# Patient Record
Sex: Female | Born: 1995 | Race: Black or African American | Hispanic: No | Smoking: Never smoker
Health system: Southern US, Community
[De-identification: ages and names within clinical notes are randomized; demographics above are authoritative.]

## PROBLEM LIST (undated history)

## (undated) ENCOUNTER — Inpatient Hospital Stay (HOSPITAL_COMMUNITY): Payer: Self-pay

## (undated) ENCOUNTER — Emergency Department (HOSPITAL_BASED_OUTPATIENT_CLINIC_OR_DEPARTMENT_OTHER): Discharge status: Absent without leave | Payer: Medicaid Other

## (undated) DIAGNOSIS — F419 Anxiety disorder, unspecified: Secondary | ICD-10-CM

## (undated) DIAGNOSIS — Z789 Other specified health status: Secondary | ICD-10-CM

## (undated) DIAGNOSIS — F32A Depression, unspecified: Secondary | ICD-10-CM

## (undated) DIAGNOSIS — F1910 Other psychoactive substance abuse, uncomplicated: Secondary | ICD-10-CM

## (undated) DIAGNOSIS — T7840XA Allergy, unspecified, initial encounter: Secondary | ICD-10-CM

## (undated) HISTORY — DX: Depression, unspecified: F32.A

## (undated) HISTORY — DX: Anxiety disorder, unspecified: F41.9

## (undated) HISTORY — DX: Allergy, unspecified, initial encounter: T78.40XA

## (undated) HISTORY — DX: Other psychoactive substance abuse, uncomplicated: F19.10

## (undated) HISTORY — DX: Other specified health status: Z78.9

## (undated) HISTORY — PX: WISDOM TOOTH EXTRACTION: SHX21

---
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
## 2015-03-23 ENCOUNTER — Encounter (HOSPITAL_COMMUNITY): Payer: Self-pay

## 2015-03-23 ENCOUNTER — Emergency Department (HOSPITAL_COMMUNITY)
Admission: EM | Admit: 2015-03-23 | Discharge: 2015-03-23 | Disposition: A | Discharge status: Home / Self Care | Payer: Medicaid Other | Attending: Emergency Medicine | Admitting: Emergency Medicine

## 2015-03-23 DIAGNOSIS — Z880 Allergy status to penicillin: Secondary | ICD-10-CM | POA: Diagnosis not present

## 2015-03-23 DIAGNOSIS — B373 Candidiasis of vulva and vagina: Secondary | ICD-10-CM | POA: Insufficient documentation

## 2015-03-23 DIAGNOSIS — Z711 Person with feared health complaint in whom no diagnosis is made: Secondary | ICD-10-CM

## 2015-03-23 DIAGNOSIS — Z202 Contact with and (suspected) exposure to infections with a predominantly sexual mode of transmission: Secondary | ICD-10-CM | POA: Diagnosis not present

## 2015-03-23 DIAGNOSIS — F17210 Nicotine dependence, cigarettes, uncomplicated: Secondary | ICD-10-CM | POA: Insufficient documentation

## 2015-03-23 DIAGNOSIS — B3731 Acute candidiasis of vulva and vagina: Secondary | ICD-10-CM

## 2015-03-23 LAB — WET PREP, GENITAL
Clue Cells Wet Prep HPF POC: NONE SEEN
Sperm: NONE SEEN
Trich, Wet Prep: NONE SEEN
Yeast Wet Prep HPF POC: NONE SEEN

## 2015-03-23 MED ORDER — CEFTRIAXONE SODIUM 250 MG IJ SOLR
250.0000 mg | Freq: Once | INTRAMUSCULAR | Status: AC
  Administered 2015-03-23: 250 mg via INTRAMUSCULAR
  Filled 2015-03-23: qty 250

## 2015-03-23 MED ORDER — ONDANSETRON 4 MG PO TBDP
4.0000 mg | ORAL_TABLET | Freq: Once | ORAL | Status: AC
  Administered 2015-03-23: 4 mg via ORAL
  Filled 2015-03-23: qty 1

## 2015-03-23 MED ORDER — LIDOCAINE HCL (PF) 1 % IJ SOLN
5.0000 mL | Freq: Once | INTRAMUSCULAR | Status: AC
  Administered 2015-03-23: 5 mL
  Filled 2015-03-23: qty 5

## 2015-03-23 MED ORDER — FLUCONAZOLE 200 MG PO TABS: 200.0000 mg | ORAL_TABLET | Freq: Every day | ORAL | Status: AC

## 2015-03-23 MED ORDER — AZITHROMYCIN 250 MG PO TABS
1000.0000 mg | ORAL_TABLET | Freq: Once | ORAL | Status: AC
  Administered 2015-03-23: 1000 mg via ORAL
  Filled 2015-03-23: qty 4

## 2015-03-23 NOTE — ED Notes (Signed)
Pt arrives stating she has been exposed to NGU and request check up.

## 2015-03-23 NOTE — ED Provider Notes (Signed)
CSN: [id number]     Arrival date & time 03/23/15  1254 History  By signing my name below, I, Ronney LionSuzanne Le, attest that this documentation has been prepared under the direction and in the presence of Marlon Peliffany Yailene Badia, PA-C. Electronically Signed: Ronney LionSuzanne Le, ED Scribe. 03/23/2015. 2:10 PM.    Chief Complaint  Patient presents with  . SEXUALLY TRANSMITTED DISEASE   The history is provided by the patient. No language interpreter was used.    HPI Comments: Marisa Page is a 19 y.o. female who presents to the Emergency Department complaining of exposure to an STD. She states her boyfriend of 2-3 months recently tested positive for NGU and advised her to be tested as well, although she has not had any symptoms. She states she normally uses condoms, but she had 2 instances of unprotected sexual intercourse, last had on 03/06/15. She denies any vaginal discharge or vaginal pain. No symptoms.  History reviewed. No pertinent past medical history. History reviewed. No pertinent past surgical history. Family History  Problem Relation Age of Onset  . Thyroid disease Mother   . Diabetes Father   . Stroke Father   . Thyroid disease Maternal Aunt   . Cancer Maternal Grandmother    Social History  Substance Use Topics  . Smoking status: Current Every Day Smoker -- 0.50 packs/day    Types: Cigarettes, Cigars  . Smokeless tobacco: None  . Alcohol Use: None   OB History    Gravida Para Term Preterm AB TAB SAB Ectopic Multiple Living   1    1     0     Review of Systems A complete 10 system review of systems was obtained and all systems are negative except as noted in the HPI and PMH.    Allergies  Penicillins  Home Medications   Prior to Admission medications   Not on File   BP 126/70 mmHg  Pulse 60  Temp(Src) 98 F (36.7 C) (Oral)  Ht 5\' 8"  (1.727 m)  Wt 80.74 kg  BMI 27.07 kg/m2  SpO2 100%  LMP 03/06/2015 Physical Exam  Constitutional: She is oriented to person, place,  and time. She appears well-developed and well-nourished. No distress.  HENT:  Head: Normocephalic and atraumatic.  Eyes: Conjunctivae and EOM are normal.  Neck: Neck supple. No tracheal deviation present.  Cardiovascular: Normal rate.   Pulmonary/Chest: Effort normal. No respiratory distress.  Genitourinary: Vagina normal and uterus normal. Right adnexum displays no mass, no tenderness and no fullness. Left adnexum displays no mass, no tenderness and no fullness.  Musculoskeletal: Normal range of motion.  Neurological: She is alert and oriented to person, place, and time.  Skin: Skin is warm and dry.  Psychiatric: She has a normal mood and affect. Her behavior is normal.  Nursing note and vitals reviewed.   ED Course  Procedures (including critical care time)  DIAGNOSTIC STUDIES: Oxygen Saturation is 100% on RA, normal by my interpretation.    COORDINATION OF CARE: 2:04 PM - GC/CHL and Wet Prep sample collected. If results are positive, will notify pt by phone in 2 days. If negative, will not contact pt. Discussed this and treatment plan with pt at bedside which includes treatment for gonorrhea/chlamydia. Advised abstaining from sex for 1 week. Pt verbalized understanding and agreed to plan.    MDM   Final diagnoses:  Concern about STD in female without diagnosis   Medications  cefTRIAXone (ROCEPHIN) injection 250 mg (not administered)  azithromycin (ZITHROMAX) tablet 1,000  mg (not administered)  ondansetron (ZOFRAN-ODT) disintegrating tablet 4 mg (not administered)    19 y.o.Marisa Page's medical screening exam was performed and I feel the patient has had an appropriate workup for their chief complaint at this time and likelihood of emergent condition existing is low. They have been counseled on decision, discharge, follow up and which symptoms necessitate immediate return to the emergency department. They or their family verbally stated understanding and agreement  with plan and discharged in stable condition.   Vital signs are stable at discharge. Filed Vitals:   03/23/15 1300  BP: 126/70  Pulse: 60  Temp: 98 F (36.7 C)      I personally performed the services described in this documentation, which was scribed in my presence. The recorded information has been reviewed and is accurate.     Marlon Pel, PA-C 03/23/15 1410  Melene Plan, DO 03/23/15 [phone]

## 2015-03-23 NOTE — Discharge Instructions (Signed)
Safe Sex  Safe sex is about reducing the risk of giving or getting a sexually transmitted disease (STD). STDs are spread through sexual contact involving the genitals, mouth, or rectum. Some STDs can be cured and others cannot. Safe sex can also prevent unintended pregnancies.   WHAT ARE SOME SAFE SEX PRACTICES?  · Limit your sexual activity to only one partner who is having sex with only you.  · Talk to your partner about his or her past partners, past STDs, and drug use.  · Use a condom every time you have sexual intercourse. This includes vaginal, oral, and anal sexual activity. Both females and males should wear condoms during oral sex. Only use latex or polyurethane condoms and water-based lubricants. Using petroleum-based lubricants or oils to lubricate a condom will weaken the condom and increase the chance that it will break. The condom should be in place from the beginning to the end of sexual activity. Wearing a condom reduces, but does not completely eliminate, your risk of getting or giving an STD. STDs can be spread by contact with infected body fluids and skin.  · Get vaccinated for hepatitis B and HPV.  · Avoid alcohol and recreational drugs, which can affect your judgment. You may forget to use a condom or participate in high-risk sex.  · For females, avoid douching after sexual intercourse. Douching can spread an infection farther into the reproductive tract.  · Check your body for signs of sores, blisters, rashes, or unusual discharge. See your health care provider if you notice any of these signs.  · Avoid sexual contact if you have symptoms of an infection or are being treated for an STD. If you or your partner has herpes, avoid sexual contact when blisters are present. Use condoms at all other times.  · If you are at risk of being infected with HIV, it is recommended that you take a prescription medicine daily to prevent HIV infection. This is called pre-exposure prophylaxis (PrEP). You are  considered at risk if:    You are a man who has sex with other men (MSM).    You are a heterosexual man or woman who is sexually active with more than one partner.    You take drugs by injection.    You are sexually active with a partner who has HIV.  · Talk with your health care provider about whether you are at high risk of being infected with HIV. If you choose to begin PrEP, you should first be tested for HIV. You should then be tested every 3 months for as long as you are taking PrEP.  · See your health care provider for regular screenings, exams, and tests for other STDs. Before having sex with a new partner, each of you should be screened for STDs and should talk about the results with each other.  WHAT ARE THE BENEFITS OF SAFE SEX?   · There is less chance of getting or giving an STD.  · You can prevent unwanted or unintended pregnancies.  · By discussing safe sex concerns with your partner, you may increase feelings of intimacy, comfort, trust, and honesty between the two of you.     This information is not intended to replace advice given to you by your health care provider. Make sure you discuss any questions you have with your health care provider.     Document Released: 05/25/2004 Document Revised: 05/08/2014 Document Reviewed: 10/09/2011  Elsevier Interactive Patient Education ©2016 Elsevier Inc.

## 2015-03-23 NOTE — ED Notes (Signed)
Patient states boyfriend advised that she needed to be tested for "NGU".  Patient unsure of his exact diagnosis.

## 2015-03-24 LAB — GC/CHLAMYDIA PROBE AMP (~~LOC~~) NOT AT ARMC
Chlamydia: NEGATIVE
Neisseria Gonorrhea: NEGATIVE

## 2015-03-30 ENCOUNTER — Telehealth (HOSPITAL_COMMUNITY): Payer: Self-pay

## 2015-04-01 ENCOUNTER — Emergency Department (HOSPITAL_COMMUNITY)
Admission: EM | Admit: 2015-04-01 | Discharge: 2015-04-01 | Disposition: A | Discharge status: Home / Self Care | Payer: Medicaid Other | Attending: Emergency Medicine | Admitting: Emergency Medicine

## 2015-04-01 ENCOUNTER — Encounter (HOSPITAL_COMMUNITY): Payer: Self-pay | Admitting: *Deleted

## 2015-04-01 DIAGNOSIS — Z880 Allergy status to penicillin: Secondary | ICD-10-CM | POA: Diagnosis not present

## 2015-04-01 DIAGNOSIS — F17200 Nicotine dependence, unspecified, uncomplicated: Secondary | ICD-10-CM | POA: Insufficient documentation

## 2015-04-01 DIAGNOSIS — K602 Anal fissure, unspecified: Secondary | ICD-10-CM | POA: Diagnosis not present

## 2015-04-01 DIAGNOSIS — K625 Hemorrhage of anus and rectum: Secondary | ICD-10-CM | POA: Diagnosis present

## 2015-04-01 MED ORDER — HYDROCORTISONE 2.5 % RE CREA: TOPICAL_CREAM | RECTAL | Status: DC

## 2015-04-01 NOTE — Discharge Instructions (Signed)
Anal Fissure, Adult °An anal fissure is a small tear or crack in the skin around the anus. Bleeding from a fissure usually stops on its own within a few minutes. However, bleeding will often occur again with each bowel movement until the crack heals. °CAUSES °This condition may be caused by: °· Passing large, hard stool (feces). °· Frequent diarrhea. °· Constipation. °· Inflammatory bowel disease (Crohn disease or ulcerative colitis). °· Infections. °· Anal sex. °SYMPTOMS °Symptoms of this condition include: °· Bleeding from the rectum. °· Small amounts of blood seen on your stool, on toilet paper, or in the toilet after a bowel movement. °· Painful bowel movements. °· Itching or irritation around the anus. °DIAGNOSIS  °A health care provider may diagnose this condition by closely examining the anal area. An anal fissure can usually be seen with careful inspection. In some cases, a rectal exam may be performed, or a short tube (anoscope) may be used to examine the anal canal. °TREATMENT °Treatment for this condition may include: °· Taking steps to avoid constipation. This may include making changes to your diet, such as increasing your intake of fiber or fluid. °· Taking fiber supplements. These supplements can soften your stool to help make bowel movements easier. Your health care provider may also prescribe a stool softener if your stool is often hard. °· Taking sitz baths. This may help to heal the tear. °· Using medicated creams or ointments. These may be prescribed to lessen discomfort. °HOME CARE INSTRUCTIONS °Eating and Drinking °· Avoid foods that may be constipating, such as bananas and dairy products. °· Drink enough fluid to keep your urine clear or pale yellow. °· Maintain a diet that is high in fruits, whole grains, and vegetables. °General Instructions °· Keep the anal area as clean and dry as possible. °· Take sitz baths as told by your health care provider. Do not use soap in the sitz baths. °· Take  over-the-counter and prescription medicines only as told by your health care provider. °· Use creams or ointments only as told by your health care provider. °· Keep all follow-up visits as told by your health care provider. This is important. °SEEK MEDICAL CARE IF: °· You have more bleeding. °· You have a fever. °· You have diarrhea that is mixed with blood. °· You continue to have pain. °· Your problem is getting worse rather than better. °  °This information is not intended to replace advice given to you by your health care provider. Make sure you discuss any questions you have with your health care provider. °  °Document Released: 04/17/2005 Document Revised: 01/06/2015 Document Reviewed: 07/13/2014 °Elsevier Interactive Patient Education ©2016 Elsevier Inc. ° °

## 2015-04-01 NOTE — ED Notes (Signed)
Pt able to dress and ambulate independently 

## 2015-04-01 NOTE — ED Notes (Signed)
PT here with complaints of itching to rectal area and today had BM and saw blood and came here.  No abdominal pain

## 2015-04-01 NOTE — ED Notes (Signed)
PA and RN at bedside.  Please see PA Assessment

## 2015-04-01 NOTE — ED Provider Notes (Signed)
CSN: [id number]     Arrival date & time 04/01/15  1444 History  By signing my name below, I, Marisa Page, attest that this documentation has been prepared under the direction and in the presence of non-physician practitioner, Sharilyn SitesLisa Yoshie Kosel, PA-C. Electronically Signed: Freida Busmaniana Page, Scribe. 04/01/2015. 4:29 PM.    Chief Complaint  Patient presents with  . Rectal Bleeding   The history is provided by the patient. No language interpreter was used.     HPI Comments:  Marisa Page is a 19 y.o. female who presents to the Emergency Department complaining of rectal bleeding today she reports associated recta itching and irritation. Pt notes mild pain and blood with her BM today. She denies abdominal pain. No alleviating factors noted. Pt has no other complaints or symptoms at this time. No hx of hemorrhoids.  No anti-coagulant use.  VSS.  History reviewed. No pertinent past medical history. History reviewed. No pertinent past surgical history. No family history on file. Social History  Substance Use Topics  . Smoking status: Current Some Day Smoker  . Smokeless tobacco: None  . Alcohol Use: Yes     Comment: occ   OB History    No data available     Review of Systems  Constitutional: Negative for fever and chills.  Respiratory: Negative for shortness of breath.   Cardiovascular: Negative for chest pain.  Gastrointestinal: Positive for anal bleeding. Negative for abdominal pain.  All other systems reviewed and are negative.   Allergies  Penicillins  Home Medications   Prior to Admission medications   Medication Sig Start Date End Date Taking? Authorizing Provider  hydrocortisone (ANUSOL-HC) 2.5 % rectal cream Apply rectally 2 times daily 04/01/15   Garlon HatchetLisa M Anuoluwapo Mefferd, PA-C   BP 138/88 mmHg  Pulse 99  Temp(Src) 98.3 F (36.8 C) (Oral)  Resp 18  SpO2 100%  LMP 03/06/2015   Physical Exam  Constitutional: She is oriented to person, place, and time. She appears well-developed  and well-nourished.  HENT:  Head: Normocephalic and atraumatic.  Mouth/Throat: Oropharynx is clear and moist.  Eyes: Conjunctivae and EOM are normal. Pupils are equal, round, and reactive to light.  Neck: Normal range of motion.  Cardiovascular: Normal rate, regular rhythm and normal heart sounds.   Pulmonary/Chest: Effort normal and breath sounds normal.  Abdominal: Soft. Bowel sounds are normal.  Genitourinary:  Chaperone (scribe & RN) was present for exam which was performed with no discomfort or complications.   Small fissure noted to 9:00 position of rectum; no active bleeding noted; appears to be development of small hemorrhoid in this area as well  Musculoskeletal: Normal range of motion.  Neurological: She is alert and oriented to person, place, and time.  Skin: Skin is warm and dry.  Psychiatric: She has a normal mood and affect.  Nursing note and vitals reviewed.   ED Course  Procedures   DIAGNOSTIC STUDIES:  Oxygen Saturation is 100% on RA, normal by my interpretation.    COORDINATION OF CARE:  4:26 PM Will discharge with anusol. Discussed treatment plan with pt at bedside and pt agreed to plan.   MDM   Final diagnoses:  Anal fissure   19 year old female with rectal itching and episode of bright red blood per rectum times daily after bowel movement. She appears to have a small anal fissure at the 9:00 position of rectum. Also appears to have development of small hemorrhoid in this area as well. Patient has no active bleeding at this time. She  is hemodynamically stable. Patient will be discharged home with Anusol rectal cream.  FU with PCP.  Discussed plan with patient, he/she acknowledged understanding and agreed with plan of care.  Return precautions given for new or worsening symptoms.  I personally performed the services described in this documentation, which was scribed in my presence. The recorded information has been reviewed and is accurate.  Garlon Hatchet,  PA-C 04/01/15 1724  Doug Sou, MD 04/02/15 [phone]

## 2015-09-24 ENCOUNTER — Encounter (HOSPITAL_COMMUNITY): Payer: Self-pay | Admitting: Emergency Medicine

## 2015-09-24 ENCOUNTER — Emergency Department (HOSPITAL_COMMUNITY)
Admission: EM | Admit: 2015-09-24 | Discharge: 2015-09-24 | Disposition: A | Discharge status: Home / Self Care | Payer: Medicaid Other | Attending: Emergency Medicine | Admitting: Emergency Medicine

## 2015-09-24 DIAGNOSIS — R319 Hematuria, unspecified: Secondary | ICD-10-CM | POA: Diagnosis not present

## 2015-09-24 DIAGNOSIS — N390 Urinary tract infection, site not specified: Secondary | ICD-10-CM | POA: Diagnosis not present

## 2015-09-24 DIAGNOSIS — Z79899 Other long term (current) drug therapy: Secondary | ICD-10-CM | POA: Diagnosis not present

## 2015-09-24 DIAGNOSIS — Z202 Contact with and (suspected) exposure to infections with a predominantly sexual mode of transmission: Secondary | ICD-10-CM

## 2015-09-24 DIAGNOSIS — F17200 Nicotine dependence, unspecified, uncomplicated: Secondary | ICD-10-CM | POA: Insufficient documentation

## 2015-09-24 LAB — PREGNANCY, URINE: Preg Test, Ur: NEGATIVE

## 2015-09-24 LAB — URINALYSIS, ROUTINE W REFLEX MICROSCOPIC
Bilirubin Urine: NEGATIVE
Glucose, UA: NEGATIVE mg/dL
Ketones, ur: 15 mg/dL — AB
Nitrite: NEGATIVE
Protein, ur: 100 mg/dL — AB
Specific Gravity, Urine: 1.028 (ref 1.005–1.030)
pH: 8 (ref 5.0–8.0)

## 2015-09-24 LAB — POC URINE PREG, ED: Preg Test, Ur: NEGATIVE

## 2015-09-24 LAB — URINE MICROSCOPIC-ADD ON: Bacteria, UA: NONE SEEN

## 2015-09-24 MED ORDER — LIDOCAINE HCL (PF) 1 % IJ SOLN
INTRAMUSCULAR | Status: AC
  Administered 2015-09-24: 0.9 mL
  Filled 2015-09-24: qty 5

## 2015-09-24 MED ORDER — ONDANSETRON 4 MG PO TBDP: 4.0000 mg | ORAL_TABLET | Freq: Three times a day (TID) | ORAL | Status: DC | PRN

## 2015-09-24 MED ORDER — CEPHALEXIN 500 MG PO CAPS: 500.0000 mg | ORAL_CAPSULE | Freq: Four times a day (QID) | ORAL | Status: DC

## 2015-09-24 MED ORDER — AZITHROMYCIN 250 MG PO TABS
1000.0000 mg | ORAL_TABLET | Freq: Once | ORAL | Status: AC
  Administered 2015-09-24: 1000 mg via ORAL
  Filled 2015-09-24: qty 4

## 2015-09-24 MED ORDER — CEFTRIAXONE SODIUM 250 MG IJ SOLR
250.0000 mg | Freq: Once | INTRAMUSCULAR | Status: AC
  Administered 2015-09-24: 250 mg via INTRAMUSCULAR
  Filled 2015-09-24: qty 250

## 2015-09-24 MED ORDER — PHENAZOPYRIDINE HCL 200 MG PO TABS: 200.0000 mg | ORAL_TABLET | Freq: Three times a day (TID) | ORAL | Status: DC

## 2015-09-24 NOTE — ED Notes (Signed)
Pt started having urinary frequency and abd pain started at 3 am.

## 2015-09-24 NOTE — Discharge Instructions (Signed)
Urinary Tract Infection Urinary tract infections (UTIs) can develop anywhere along your urinary tract. Your urinary tract is your body's drainage system for removing wastes and extra water. Your urinary tract includes two kidneys, two ureters, a bladder, and a urethra. Your kidneys are a pair of bean-shaped organs. Each kidney is about the size of your fist. They are located below your ribs, one on each side of your spine. CAUSES Infections are caused by microbes, which are microscopic organisms, including fungi, viruses, and bacteria. These organisms are so small that they can only be seen through a microscope. Bacteria are the microbes that most commonly cause UTIs. SYMPTOMS  Symptoms of UTIs may vary by age and gender of the patient and by the location of the infection. Symptoms in young women typically include a frequent and intense urge to urinate and a painful, burning feeling in the bladder or urethra during urination. Older women and men are more likely to be tired, shaky, and weak and have muscle aches and abdominal pain. A fever may mean the infection is in your kidneys. Other symptoms of a kidney infection include pain in your back or sides below the ribs, nausea, and vomiting. DIAGNOSIS To diagnose a UTI, your caregiver will ask you about your symptoms. Your caregiver will also ask you to provide a urine sample. The urine sample will be tested for bacteria and white blood cells. White blood cells are made by your body to help fight infection. TREATMENT  Typically, UTIs can be treated with medication. Because most UTIs are caused by a bacterial infection, they usually can be treated with the use of antibiotics. The choice of antibiotic and length of treatment depend on your symptoms and the type of bacteria causing your infection. HOME CARE INSTRUCTIONS  If you were prescribed antibiotics, take them exactly as your caregiver instructs you. Finish the medication even if you feel better after  you have only taken some of the medication.  Drink enough water and fluids to keep your urine clear or pale yellow.  Avoid caffeine, tea, and carbonated beverages. They tend to irritate your bladder.  Empty your bladder often. Avoid holding urine for long periods of time.  Empty your bladder before and after sexual intercourse.  After a bowel movement, women should cleanse from front to back. Use each tissue only once. SEEK MEDICAL CARE IF:   You have back pain.  You develop a fever.  Your symptoms do not begin to resolve within 3 days. SEEK IMMEDIATE MEDICAL CARE IF:   You have severe back pain or lower abdominal pain.  You develop chills.  You have nausea or vomiting.  You have continued burning or discomfort with urination. MAKE SURE YOU:   Understand these instructions.  Will watch your condition.  Will get help right away if you are not doing well or get worse.   This information is not intended to replace advice given to you by your health care provider. Make sure you discuss any questions you have with your health care provider.   Document Released: 01/25/2005 Document Revised: 01/06/2015 Document Reviewed: 05/26/2011 Elsevier Interactive Patient Education 2016 [employer]   Safe Sex Safe sex is about reducing the risk of giving or getting a sexually transmitted disease (STD). STDs are spread through sexual contact involving the genitals, mouth, or rectum. Some STDs can be cured and others cannot. Safe sex can also prevent unintended pregnancies.  WHAT ARE SOME SAFE SEX PRACTICES?  Limit your sexual activity to only  one partner who is having sex with only you.  Talk to your partner about his or her past partners, past STDs, and drug use.  Use a condom every time you have sexual intercourse. This includes vaginal, oral, and anal sexual activity. Both females and males should wear condoms during oral sex. Only use latex or polyurethane condoms and water-based  lubricants. Using petroleum-based lubricants or oils to lubricate a condom will weaken the condom and increase the chance that it will break. The condom should be in place from the beginning to the end of sexual activity. Wearing a condom reduces, but does not completely eliminate, your risk of getting or giving an STD. STDs can be spread by contact with infected body fluids and skin.  Get vaccinated for hepatitis B and HPV.  Avoid alcohol and recreational drugs, which can affect your judgment. You may forget to use a condom or participate in high-risk sex.  For females, avoid douching after sexual intercourse. Douching can spread an infection farther into the reproductive tract.  Check your body for signs of sores, blisters, rashes, or unusual discharge. See your health care provider if you notice any of these signs.  Avoid sexual contact if you have symptoms of an infection or are being treated for an STD. If you or your partner has herpes, avoid sexual contact when blisters are present. Use condoms at all other times.  If you are at risk of being infected with HIV, it is recommended that you take a prescription medicine daily to prevent HIV infection. This is called pre-exposure prophylaxis (PrEP). You are considered at risk if:  You are a man who has sex with other men (MSM).  You are a heterosexual man or woman who is sexually active with more than one partner.  You take drugs by injection.  You are sexually active with a partner who has HIV.  Talk with your health care provider about whether you are at high risk of being infected with HIV. If you choose to begin PrEP, you should first be tested for HIV. You should then be tested every 3 months for as long as you are taking PrEP.  See your health care provider for regular screenings, exams, and tests for other STDs. Before having sex with a new partner, each of you should be screened for STDs and should talk about the results with each  other. WHAT ARE THE BENEFITS OF SAFE SEX?   There is less chance of getting or giving an STD.  You can prevent unwanted or unintended pregnancies.  By discussing safe sex concerns with your partner, you may increase feelings of intimacy, comfort, trust, and honesty between the two of you.   This information is not intended to replace advice given to you by your health care provider. Make sure you discuss any questions you have with your health care provider.   Document Released: 05/25/2004 Document Revised: 05/08/2014 Document Reviewed: 10/09/2011 Elsevier Interactive Patient Education 2016 [employer].  Sexually Transmitted Disease A sexually transmitted disease (STD) is a disease or infection often passed to another person during sex. However, STDs can be passed through nonsexual ways. An STD can be passed through:  Spit (saliva).  Semen.  Blood.  Mucus from the vagina.  Pee (urine). HOW CAN I LESSEN MY CHANCES OF GETTING AN STD?  Use:  Latex condoms.  Water-soluble lubricants with condoms. Do not use petroleum jelly or oils.  Dental dams. These are small pieces of latex that are used  as a barrier during oral sex.  Avoid having more than one sex partner.  Do not have sex with someone who has other sex partners.  Do not have sex with anyone you do not know or who is at high risk for an STD.  Avoid risky sex that can break your skin.  Do not have sex if you have open sores on your mouth or skin.  Avoid drinking too much alcohol or taking illegal drugs. Alcohol and drugs can affect your good judgment.  Avoid oral and anal sex acts.  Get shots (vaccines) for HPV and hepatitis.  If you are at risk of being infected with HIV, it is advised that you take a certain medicine daily to prevent HIV infection. This is called pre-exposure prophylaxis (PrEP). You may be at risk if:  You are a man who has sex with other men (MSM).  You are attracted to the opposite sex  (heterosexual) and are having sex with more than one partner.  You take drugs with a needle.  You have sex with someone who has HIV.  Talk with your doctor about if you are at high risk of being infected with HIV. If you begin to take PrEP, get tested for HIV first. Get tested every 3 months for as long as you are taking PrEP.  Get tested for STDs every year if you are sexually active. If you are treated for an STD, get tested again 3 months after you are treated. WHAT SHOULD I DO IF I THINK I HAVE AN STD?  See your doctor.  Tell your sex partner(s) that you have an STD. They should be tested and treated.  Do not have sex until your doctor says it is okay. WHEN SHOULD I GET HELP? Get help right away if:  You have bad belly (abdominal) pain.  You are a man and have puffiness (swelling) or pain in your testicles.  You are a woman and have puffiness in your vagina.   This information is not intended to replace advice given to you by your health care provider. Make sure you discuss any questions you have with your health care provider.   Document Released: 05/25/2004 Document Revised: 05/08/2014 Document Reviewed: 10/11/2012 Elsevier Interactive Patient Education [employer]

## 2015-09-24 NOTE — ED Notes (Signed)
Pelvic cart in room and set up

## 2015-09-25 NOTE — ED Provider Notes (Signed)
CSN: [id number]     Arrival date & time 09/24/15  8413 History   First MD Initiated Contact with Patient 09/24/15 1030     Chief Complaint  Patient presents with  . Urinary Tract Infection  . Hematuria     (Consider location/radiation/quality/duration/timing/severity/associated sxs/prior Treatment) HPI   Patient is a 20 year old female presents to the emergency department for urinary frequency and pelvic discomfort when urinating that began this morning at approximately 3 AM. She denies hematuria but endorses urgency and sensation that she has to urinate again immediately after finishing.  She only has slight discomfort in her suprapubic area when urinating it feels like it is cramping or contracting. She denies any other abdominal pain and denies pelvic pain when at rest.  She states that it feels similar to one other time that she had a UTI.  She denies nausea, vomiting, fevers, flank pain. She is sexually active with her boyfriend without protection, she has been with him for approximately 2 months.  She denies any abnormal vaginal discharge, vaginal irritation, dyspareunia. Neither have been tested since been sexually active together. She requests STD testing at this time. LMP. 09/06/2015 No other complaints.  History reviewed. No pertinent past medical history. History reviewed. No pertinent past surgical history. No family history on file. Social History  Substance Use Topics  . Smoking status: Current Some Day Smoker  . Smokeless tobacco: None  . Alcohol Use: Yes     Comment: occ   OB History    No data available     Review of Systems  All other systems reviewed and are negative.     Allergies  Penicillins  Home Medications   Prior to Admission medications   Medication Sig Start Date End Date Taking? Authorizing Provider  acetaminophen (TYLENOL) 325 MG tablet Take 650 mg by mouth every 6 (six) hours as needed (pain).   Yes Historical Provider, MD  cephALEXin (KEFLEX)  500 MG capsule Take 1 capsule (500 mg total) by mouth 4 (four) times daily. 09/24/15   Danelle Berry, PA-C  ondansetron (ZOFRAN ODT) 4 MG disintegrating tablet Take 1 tablet (4 mg total) by mouth every 8 (eight) hours as needed for nausea or vomiting. 09/24/15   Danelle Berry, PA-C  phenazopyridine (PYRIDIUM) 200 MG tablet Take 1 tablet (200 mg total) by mouth 3 (three) times daily. 09/24/15   Danelle Berry, PA-C   BP 116/71 mmHg  Pulse 67  Temp(Src) 98.6 F (37 C) (Oral)  Resp 14  SpO2 100%  LMP 09/06/2015 (Exact Date) Physical Exam  Constitutional: She is oriented to person, place, and time. She appears well-developed and well-nourished. No distress.  HENT:  Head: Normocephalic and atraumatic.  Nose: Nose normal.  Mouth/Throat: Oropharynx is clear and moist. No oropharyngeal exudate.  Eyes: Conjunctivae and EOM are normal. Pupils are equal, round, and reactive to light. Right eye exhibits no discharge. Left eye exhibits no discharge. No scleral icterus.  Neck: Normal range of motion. No JVD present. No tracheal deviation present. No thyromegaly present.  Cardiovascular: Normal rate, regular rhythm, normal heart sounds and intact distal pulses.  Exam reveals no gallop and no friction rub.   No murmur heard. Pulmonary/Chest: Effort normal and breath sounds normal. No respiratory distress. She has no wheezes. She has no rales. She exhibits no tenderness.  Abdominal: Soft. Normal appearance and bowel sounds are normal. She exhibits no distension and no mass. There is no tenderness. There is no rebound and no guarding.  No CVA tenderness  Genitourinary: Vagina normal.  Musculoskeletal: Normal range of motion. She exhibits no edema or tenderness.  Lymphadenopathy:    She has no cervical adenopathy.  Neurological: She is alert and oriented to person, place, and time. She has normal reflexes. No cranial nerve deficit. She exhibits normal muscle tone. Coordination normal.  Skin: Skin is warm and dry. No  rash noted. She is not diaphoretic. No erythema. No pallor.  Psychiatric: She has a normal mood and affect. Her behavior is normal. Judgment and thought content normal.  Nursing note and vitals reviewed.   ED Course  Procedures (including critical care time) Labs Review Labs Reviewed  URINALYSIS, ROUTINE W REFLEX MICROSCOPIC (NOT AT [facility]) - Abnormal; Notable for the following:    Color, Urine BROWN (*)    APPearance TURBID (*)    Hgb urine dipstick LARGE (*)    Ketones, ur 15 (*)    Protein, ur 100 (*)    Leukocytes, UA LARGE (*)    All other components within normal limits  URINE MICROSCOPIC-ADD ON - Abnormal; Notable for the following:    Squamous Epithelial / LPF 0-5 (*)    All other components within normal limits  PREGNANCY, URINE  POC URINE PREG, ED  GC/CHLAMYDIA PROBE AMP ([place]) NOT AT [facility]    Imaging Review No results found. I have personally reviewed and evaluated these images and lab results as part of my medical decision-making.   EKG Interpretation None      MDM   20 year old female patient, otherwise healthy, presents with urinary frequency and some low abdominal discomfort when urinating. She is afebrile, has not had any vomiting, nausea, flank pain. She denies vaginal symptoms but requests STD testing. Urinalysis, urine pregnant and GC and chlamydia added to urine.  She is well-appearing, has had symptoms for only a few hours today, did not feel she needs further workup such as IV and blood work.  Urine is positive for large leukocytes, pyuria.  We'll treat for UTI with Keflex. She is tolerating PO's, is hemodynamically stable. She was given one time treatment for STD exposure and was encouraged to sign out to my chart and review her lab results, encouraged to have boyfriend treated if she is positive for any STDs.  . Methods also encouraged. Return precautions reviewed, patient verbalizes understanding.  Vital signs stable, patient discharged in good  condition. Filed Vitals:   09/24/15 0956 09/24/15 1049  BP: 112/65 116/71  Pulse: 74 67  Temp: 98.6 F (37 C)   Resp: 14      Final diagnoses:  UTI (lower urinary tract infection)  Possible exposure to STD        Danelle BerryLeisa Melquisedec Journey, PA-C 09/25/15 2103  Benjiman CoreNathan Pickering, MD 09/30/15 2259

## 2015-10-19 ENCOUNTER — Encounter (HOSPITAL_COMMUNITY): Payer: Self-pay | Admitting: Emergency Medicine

## 2015-10-19 ENCOUNTER — Emergency Department (HOSPITAL_COMMUNITY)
Admission: EM | Admit: 2015-10-19 | Discharge: 2015-10-20 | Disposition: A | Discharge status: Home / Self Care | Payer: Medicaid Other | Attending: Dermatology | Admitting: Dermatology

## 2015-10-19 DIAGNOSIS — F17210 Nicotine dependence, cigarettes, uncomplicated: Secondary | ICD-10-CM | POA: Insufficient documentation

## 2015-10-19 DIAGNOSIS — X58XXXA Exposure to other specified factors, initial encounter: Secondary | ICD-10-CM | POA: Insufficient documentation

## 2015-10-19 DIAGNOSIS — Y9341 Activity, dancing: Secondary | ICD-10-CM | POA: Insufficient documentation

## 2015-10-19 DIAGNOSIS — M25471 Effusion, right ankle: Secondary | ICD-10-CM | POA: Insufficient documentation

## 2015-10-19 DIAGNOSIS — Y999 Unspecified external cause status: Secondary | ICD-10-CM | POA: Insufficient documentation

## 2015-10-19 DIAGNOSIS — Z5321 Procedure and treatment not carried out due to patient leaving prior to being seen by health care provider: Secondary | ICD-10-CM | POA: Insufficient documentation

## 2015-10-19 DIAGNOSIS — M25571 Pain in right ankle and joints of right foot: Secondary | ICD-10-CM | POA: Insufficient documentation

## 2015-10-19 DIAGNOSIS — Y929 Unspecified place or not applicable: Secondary | ICD-10-CM | POA: Insufficient documentation

## 2015-10-19 NOTE — ED Notes (Signed)
Unable to locate pt for a room. Radiology also unable to locate pt

## 2015-10-19 NOTE — ED Notes (Signed)
Pt. injured her right ankle while dancing this evening , presents with mild swelling /pain at right ankle .

## 2016-02-01 ENCOUNTER — Encounter: Payer: Self-pay | Admitting: *Deleted

## 2016-02-01 ENCOUNTER — Ambulatory Visit: Payer: Self-pay | Admitting: *Deleted

## 2016-02-01 DIAGNOSIS — Z3201 Encounter for pregnancy test, result positive: Secondary | ICD-10-CM

## 2016-02-01 LAB — POCT PREGNANCY, URINE: Preg Test, Ur: POSITIVE — AB

## 2016-02-01 NOTE — Progress Notes (Signed)
Pt in for pregnancy test. Results are positive. Pt lmp is 12/26/15. Pregnancy verification letter given to patient. Meds and allergies reviewed.

## 2016-02-10 ENCOUNTER — Inpatient Hospital Stay (HOSPITAL_COMMUNITY)
Admission: AD | Admit: 2016-02-10 | Discharge: 2016-02-10 | Discharge status: Against Medical Advice | Payer: Self-pay | Source: Ambulatory Visit | Attending: Family Medicine | Admitting: Family Medicine

## 2016-02-10 ENCOUNTER — Encounter: Payer: Self-pay | Admitting: Obstetrics and Gynecology

## 2016-02-10 DIAGNOSIS — Z5321 Procedure and treatment not carried out due to patient leaving prior to being seen by health care provider: Secondary | ICD-10-CM | POA: Insufficient documentation

## 2016-02-10 DIAGNOSIS — R112 Nausea with vomiting, unspecified: Secondary | ICD-10-CM | POA: Insufficient documentation

## 2016-02-10 LAB — URINALYSIS, ROUTINE W REFLEX MICROSCOPIC
Bilirubin Urine: NEGATIVE
Glucose, UA: NEGATIVE mg/dL
Ketones, ur: 15 mg/dL — AB
Leukocytes, UA: NEGATIVE
Nitrite: NEGATIVE
Protein, ur: NEGATIVE mg/dL
Specific Gravity, Urine: 1.025 (ref 1.005–1.030)
pH: 6.5 (ref 5.0–8.0)

## 2016-02-10 LAB — URINE MICROSCOPIC-ADD ON

## 2016-02-10 LAB — POCT PREGNANCY, URINE: Preg Test, Ur: POSITIVE — AB

## 2016-02-10 NOTE — MAU Note (Signed)
Patient not in lobby when called upon.

## 2016-02-10 NOTE — MAU Note (Signed)
Pt presents to MAU with complaints of nausea and vomiting for 2 weeks and back pain. Denies any vaginal bleeding or abnormal discharge.

## 2016-02-10 NOTE — MAU Note (Signed)
Patient no in lobby when called upon.

## 2016-02-10 NOTE — MAU Note (Signed)
Patient not present in Lobby when called.

## 2016-03-30 ENCOUNTER — Encounter: Payer: Self-pay | Admitting: Obstetrics and Gynecology

## 2016-03-30 ENCOUNTER — Ambulatory Visit (INDEPENDENT_AMBULATORY_CARE_PROVIDER_SITE_OTHER): Payer: Self-pay | Admitting: Obstetrics and Gynecology

## 2016-03-30 DIAGNOSIS — Z113 Encounter for screening for infections with a predominantly sexual mode of transmission: Secondary | ICD-10-CM

## 2016-03-30 DIAGNOSIS — Z3490 Encounter for supervision of normal pregnancy, unspecified, unspecified trimester: Secondary | ICD-10-CM | POA: Insufficient documentation

## 2016-03-30 DIAGNOSIS — Z23 Encounter for immunization: Secondary | ICD-10-CM

## 2016-03-30 DIAGNOSIS — Z3492 Encounter for supervision of normal pregnancy, unspecified, second trimester: Secondary | ICD-10-CM

## 2016-03-30 LAB — POCT URINALYSIS DIP (DEVICE)
Bilirubin Urine: NEGATIVE
Glucose, UA: NEGATIVE mg/dL
Hgb urine dipstick: NEGATIVE
Ketones, ur: NEGATIVE mg/dL
Leukocytes, UA: NEGATIVE
Nitrite: NEGATIVE
Protein, ur: NEGATIVE mg/dL
Specific Gravity, Urine: 1.025 (ref 1.005–1.030)
Urobilinogen, UA: 0.2 mg/dL (ref 0.0–1.0)
pH: 6 (ref 5.0–8.0)

## 2016-03-30 NOTE — Patient Instructions (Addendum)
AREA PEDIATRIC/FAMILY PRACTICE PHYSICIANS  [facility] [address], [address] [place], [place]  [address] Phone - [phone]   Fax - [phone]  [facility] [address] [place], [place] [address] Phone - [phone]   Fax - [phone]  [facility] [address] [place], [place]  [address] Phone - [phone]   Fax - [phone]  [facility] [address] [place], [place]  [address] Phone - [phone]   Fax - [phone]  [facility] [address] [place], [place]  [address] Phone - [phone]   Fax - [phone]  [facility] [address] [place], [place]  [address] Phone - [phone]   Fax - [phone]  [facility] [address] [place], [place]  [address] Phone - [phone]   Fax - [phone]  [facility] [address] [place], [place]  [address] Phone - [phone]   Fax - [phone]  [facility] [address] [place], [place]  [address] Phone - [phone]   Fax - [phone] [facility] [address] [place], [place]  [address] Phone - [phone]   Fax - [phone]  [facility] [address] [place], [place]  [address] Phone - [phone]   Fax - [phone]  [facility] [address] [place], [place]  [address] Phone - [phone]   Fax - [phone]  [facility] [address] [place], [place]  [address] Phone - [phone]   Fax - [phone]  [facility] [address] [place], [place]  [address] Phone - [phone]  [facility] [address] [place], [place]  [address] Phone - [phone]   Fax - [phone]  [facility] [address] [place], [place]  [address] Phone - [phone]   Fax - [phone]  [facility] [address] [place], [place]  [address] Phone - [phone]   Fax - [phone]  [facility] [address] [place], [place]  [address] Phone - [phone]   Fax - [phone]   [facility] [address] [place], [place]  [address] Phone - [phone]   Fax - [phone]  [facility] [address] [place], [place]  [address] Phone - [phone]   Fax - [phone]  [facility] [address] [place], [place]  [address] Phone - [phone]   Fax - [phone]  [facility] [address] [place], [place]  [address] Phone - [phone]   Fax - [phone]  [facility] [address] [place], [place]  [address] Phone - [phone]   Fax - [phone] [facility] [address] [place], [place]  [address] Phone - [phone]   Fax - [phone]  [facility] [address] [place], [place]  [address] Phone - [phone]   Fax - [phone]  [facility] [address] [place], [place]  [address] Phone - [phone]   Fax - [phone]   Second Trimester of Pregnancy The second trimester is from week 13 through week 28 (months 4 through 6). The second trimester is often a time when you feel your best. Your body has   also adjusted to being pregnant, and you begin to feel better physically. Usually, morning sickness has lessened or quit completely, you may have more energy, and you may have an increase in appetite. The second trimester is also a time when the fetus is growing rapidly. At the end of the sixth month, the fetus is about 9 inches long and weighs about 1 pounds. You will likely begin to feel the baby move (quickening) between 18 and 20 weeks of the  pregnancy. Body changes during your second trimester Your body continues to go through many changes during your second trimester. The changes vary from woman to woman.  Your weight will continue to increase. You will notice your lower abdomen bulging out.  You may begin to get stretch marks on your hips, abdomen, and breasts.  You may develop headaches that can be relieved by medicines. The medicines should be approved by your health care provider.  You may urinate more often because the fetus is pressing on your bladder.  You may develop or continue to have heartburn as a result of your pregnancy.  You may develop constipation because certain hormones are causing the muscles that push waste through your intestines to slow down.  You may develop hemorrhoids or swollen, bulging veins (varicose veins).  You may have back pain. This is caused by:  Weight gain.  Pregnancy hormones that are relaxing the joints in your pelvis.  A shift in weight and the muscles that support your balance.  Your breasts will continue to grow and they will continue to become tender.  Your gums may bleed and may be sensitive to brushing and flossing.  Dark spots or blotches (chloasma, mask of pregnancy) may develop on your face. This will likely fade after the baby is born.  A dark line from your belly button to the pubic area (linea nigra) may appear. This will likely fade after the baby is born.  You may have changes in your hair. These can include thickening of your hair, rapid growth, and changes in texture. Some women also have hair loss during or after pregnancy, or hair that feels dry or thin. Your hair will most likely return to normal after your baby is born. What to expect at prenatal visits During a routine prenatal visit:  You will be weighed to make sure you and the fetus are growing normally.  Your blood pressure will be taken.  Your abdomen will be measured to track your baby's  growth.  The fetal heartbeat will be listened to.  Any test results from the previous visit will be discussed. Your health care provider may ask you:  How you are feeling.  If you are feeling the baby move.  If you have had any abnormal symptoms, such as leaking fluid, bleeding, severe headaches, or abdominal cramping.  If you are using any tobacco products, including cigarettes, chewing tobacco, and electronic cigarettes.  If you have any questions. Other tests that may be performed during your second trimester include:  Blood tests that check for:  Low iron levels (anemia).  Gestational diabetes (between 24 and 28 weeks).  Rh antibodies. This is to check for a protein on red blood cells (Rh factor).  Urine tests to check for infections, diabetes, or protein in the urine.  An ultrasound to confirm the proper growth and development of the baby.  An amniocentesis to check for possible genetic problems.  Fetal screens for spina bifida and Down syndrome.  HIV (human immunodeficiency virus)   testing. Routine prenatal testing includes screening for HIV, unless you choose not to have this test. Follow these instructions at home: Eating and drinking  Continue to eat regular, healthy meals.  Avoid raw meat, uncooked cheese, cat litter boxes, and soil used by cats. These carry germs that can cause birth defects in the baby.  Take your prenatal vitamins.  Take 1500-2000 mg of calcium daily starting at the 20th week of pregnancy until you deliver your baby.  If you develop constipation:  Take over-the-counter or prescription medicines.  Drink enough fluid to keep your urine clear or pale yellow.  Eat foods that are high in fiber, such as fresh fruits and vegetables, whole grains, and beans.  Limit foods that are high in fat and processed sugars, such as fried and sweet foods. Activity  Exercise only as directed by your health care provider. Experiencing uterine cramps is  a good sign to stop exercising.  Avoid heavy lifting, wear low heel shoes, and practice good posture.  Wear your seat belt at all times when driving.  Rest with your legs elevated if you have leg cramps or low back pain.  Wear a good support bra for breast tenderness.  Do not use hot tubs, steam rooms, or saunas. Lifestyle  Avoid all smoking, herbs, alcohol, and unprescribed drugs. These chemicals affect the formation and growth of the baby.  Do not use any products that contain nicotine or tobacco, such as cigarettes and e-cigarettes. If you need help quitting, ask your health care provider.  A sexual relationship may be continued unless your health care provider directs you otherwise. General instructions  Follow your health care provider's instructions regarding medicine use. There are medicines that are either safe or unsafe to take during pregnancy.  Take warm sitz baths to soothe any pain or discomfort caused by hemorrhoids. Use hemorrhoid cream if your health care provider approves.  If you develop varicose veins, wear support hose. Elevate your feet for 15 minutes, 3-4 times a day. Limit salt in your diet.  Visit your dentist if you have not gone yet during your pregnancy. Use a soft toothbrush to brush your teeth and be gentle when you floss.  Keep all follow-up prenatal visits as told by your health care provider. This is important. Contact a health care provider if:  You have dizziness.  You have mild pelvic cramps, pelvic pressure, or nagging pain in the abdominal area.  You have persistent nausea, vomiting, or diarrhea.  You have a bad smelling vaginal discharge.  You have pain with urination. Get help right away if:  You have a fever.  You are leaking fluid from your vagina.  You have spotting or bleeding from your vagina.  You have severe abdominal cramping or pain.  You have rapid weight gain or weight loss.  You have shortness of breath with chest  pain.  You notice sudden or extreme swelling of your face, hands, ankles, feet, or legs.  You have not felt your baby move in over an hour.  You have severe headaches that do not go away with medicine.  You have vision changes. Summary  The second trimester is from week 13 through week 28 (months 4 through 6). It is also a time when the fetus is growing rapidly.  Your body goes through many changes during pregnancy. The changes vary from woman to woman.  Avoid all smoking, herbs, alcohol, and unprescribed drugs. These chemicals affect the formation and growth your baby.  Do   not use any tobacco products, such as cigarettes, chewing tobacco, and e-cigarettes. If you need help quitting, ask your health care provider.  Contact your health care provider if you have any questions. Keep all prenatal visits as told by your health care provider. This is important. This information is not intended to replace advice given to you by your health care provider. Make sure you discuss any questions you have with your health care provider. Document Released: 04/11/2001 Document Revised: 09/23/2015 Document Reviewed: 06/18/2012 Elsevier Interactive Patient Education  2017 Elsevier Inc.  

## 2016-03-30 NOTE — Progress Notes (Signed)
Subjective:  Marisa Page is a 20 y.o. G1P0000 at 4520w4d being seen today for ongoing prenatal care.  She is currently monitored for the following issues for this low-risk pregnancy and has Supervision of low-risk pregnancy on her problem list.  Patient reports nausea.  Contractions: Not present. Vag. Bleeding: None.  Movement: Absent. Denies leaking of fluid.   The following portions of the patient's history were reviewed and updated as appropriate: allergies, current medications, past family history, past medical history, past social history, past surgical history and problem list. Problem list updated.  Objective:   Vitals:   03/30/16 0821  BP: 115/66  Pulse: 68  Weight: 168 lb (76.2 kg)    Fetal Status:     Movement: Absent     General:  Alert, oriented and cooperative. Patient is in no acute distress.  Skin: Skin is warm and dry. No rash noted.   Cardiovascular: Normal heart rate noted  Respiratory: Normal respiratory effort, no problems with respiration noted  Abdomen: Soft, gravid, appropriate for gestational age. Pain/Pressure: Present     Pelvic:  Cervical exam deferred        Extremities: Normal range of motion.  Edema: None  Mental Status: Normal mood and affect. Normal behavior. Normal judgment and thought content.   Urinalysis:      Assessment and Plan:  Pregnancy: G1P0000 at 7020w4d  1. Encounter for supervision of low-risk pregnancy in second trimester  - Prenatal Profile - Hemoglobinopathy Evaluation - Culture, OB Urine - Pain Mgmt, Profile 6 Conf w/o mM, U - GC/Chlamydia probe amp ([place])not at [facility] - US MFM OB COMP + 14 WK; Future - Flu Vaccine QUAD 36+ mos IM (Fluarix, Quad PF)  Preterm labor symptoms and general obstetric precautions including but not limited to vaginal bleeding, contractions, leaking of fluid and fetal movement were reviewed in detail with the patient. Please refer to After Visit Summary for other counseling  recommendations.  Return in about 4 weeks (around 04/27/2016) for OB visit.   Hermina StaggersMichael L Darden Flemister, MD

## 2016-03-31 LAB — PRENATAL PROFILE (SOLSTAS)
Antibody Screen: NEGATIVE
Basophils Absolute: 0 cells/uL (ref 0–200)
Basophils Relative: 0 %
Eosinophils Absolute: 80 cells/uL (ref 15–500)
Eosinophils Relative: 1 %
HCT: 37.4 % (ref 35.0–45.0)
HIV 1&2 Ab, 4th Generation: NONREACTIVE
Hemoglobin: 12.2 g/dL (ref 11.7–15.5)
Hepatitis B Surface Ag: NEGATIVE
Lymphocytes Relative: 36 %
Lymphs Abs: 2880 cells/uL (ref 850–3900)
MCH: 30.1 pg (ref 27.0–33.0)
MCHC: 32.6 g/dL (ref 32.0–36.0)
MCV: 92.3 fL (ref 80.0–100.0)
MPV: 11.5 fL (ref 7.5–12.5)
Monocytes Absolute: 560 cells/uL (ref 200–950)
Monocytes Relative: 7 %
Neutro Abs: 4480 cells/uL (ref 1500–7800)
Neutrophils Relative %: 56 %
Platelets: 244 10*3/uL (ref 140–400)
RBC: 4.05 MIL/uL (ref 3.80–5.10)
RDW: 13.8 % (ref 11.0–15.0)
Rh Type: POSITIVE
Rubella: 6.61 Index — ABNORMAL HIGH (ref <0.90)
WBC: 8 10*3/uL (ref 3.8–10.8)

## 2016-03-31 LAB — GC/CHLAMYDIA PROBE AMP (~~LOC~~) NOT AT ARMC
Chlamydia: NEGATIVE
Neisseria Gonorrhea: NEGATIVE

## 2016-04-01 LAB — CULTURE, OB URINE

## 2016-04-03 LAB — PAIN MGMT, PROFILE 6 CONF W/O MM, U
6 Acetylmorphine: NEGATIVE ng/mL (ref <10)
Alcohol Metabolites: NEGATIVE ng/mL (ref <500)
Amphetamines: NEGATIVE ng/mL (ref <500)
Barbiturates: NEGATIVE ng/mL (ref <300)
Benzodiazepines: NEGATIVE ng/mL (ref <100)
Cocaine Metabolite: NEGATIVE ng/mL (ref <150)
Creatinine: 196.2 mg/dL (ref >=20.0)
EDDP: 144 ng/mL — ABNORMAL HIGH (ref <100)
Marijuana Metabolite: 203 ng/mL — ABNORMAL HIGH (ref <5)
Marijuana Metabolite: POSITIVE ng/mL — AB (ref <20)
Methadone Metabolite: POSITIVE ng/mL — AB (ref <100)
Methadone: NEGATIVE ng/mL (ref <100)
Opiates: NEGATIVE ng/mL (ref <100)
Oxidant: NEGATIVE ug/mL (ref <200)
Oxycodone: NEGATIVE ng/mL (ref <100)
Phencyclidine: NEGATIVE ng/mL (ref <25)
Please note:: 0
pH: 7.15 (ref 4.5–9.0)

## 2016-04-03 LAB — HEMOGLOBINOPATHY EVALUATION
HCT: 37.4 % (ref 35.0–45.0)
Hemoglobin: 12.2 g/dL (ref 11.7–15.5)
Hgb A2 Quant: 2.6 % (ref 1.8–3.5)
Hgb A: 96.4 % (ref >96.0)
Hgb F Quant: <1 % (ref <2.0)
MCH: 30.1 pg (ref 27.0–33.0)
MCV: 92.3 fL (ref 80.0–100.0)
RBC: 4.05 MIL/uL (ref 3.80–5.10)
RDW: 13.8 % (ref 11.0–15.0)

## 2016-04-04 ENCOUNTER — Other Ambulatory Visit: Payer: Medicaid Other

## 2016-04-04 DIAGNOSIS — Z3400 Encounter for supervision of normal first pregnancy, unspecified trimester: Secondary | ICD-10-CM

## 2016-04-05 LAB — 2HR GTT W 1 HR, CARPENTER, 75 G
Glucose, 1 Hr, Gest: 68 mg/dL (ref <180)
Glucose, 2 Hr, Gest: 72 mg/dL (ref <153)
Glucose, Fasting, Gest: 78 mg/dL (ref 65–91)

## 2016-04-26 ENCOUNTER — Encounter: Payer: Self-pay | Admitting: Obstetrics and Gynecology

## 2016-04-26 ENCOUNTER — Ambulatory Visit (INDEPENDENT_AMBULATORY_CARE_PROVIDER_SITE_OTHER): Payer: Medicaid Other | Admitting: Obstetrics and Gynecology

## 2016-04-26 VITALS — BP 96/61 | HR 80 | Wt 174.2 lb

## 2016-04-26 DIAGNOSIS — Z3492 Encounter for supervision of normal pregnancy, unspecified, second trimester: Secondary | ICD-10-CM

## 2016-04-26 DIAGNOSIS — F1210 Cannabis abuse, uncomplicated: Secondary | ICD-10-CM | POA: Insufficient documentation

## 2016-04-26 DIAGNOSIS — O99322 Drug use complicating pregnancy, second trimester: Secondary | ICD-10-CM

## 2016-04-26 NOTE — Progress Notes (Signed)
Prenatal Visit Note Date: 04/26/2016 Clinic: [facility]  Subjective:  Marisa Page is a 20 y.o. G1P0000 at 1323w3d being seen today for ongoing prenatal care.  She is currently monitored for the following issues for this low-risk pregnancy and has Supervision of low-risk pregnancy and Marijuana abuse on her problem list.  Patient reports low back pain.   Contractions: Not present. Vag. Bleeding: None.  Movement: Present. Denies leaking of fluid.   The following portions of the patient's history were reviewed and updated as appropriate: allergies, current medications, past family history, past medical history, past social history, past surgical history and problem list. Problem list updated.  Objective:   Vitals:   04/26/16 0919  BP: 96/61  Pulse: 80  Weight: 174 lb 3.2 oz (79 kg)    Fetal Status: Fetal Heart Rate (bpm): 150   Movement: Present     General:  Alert, oriented and cooperative. Patient is in no acute distress.  Skin: Skin is warm and dry. No rash noted.   Cardiovascular: Normal heart rate noted  Respiratory: Normal respiratory effort, no problems with respiration noted  Abdomen: Soft, gravid, appropriate for gestational age. Pain/Pressure: Present     Pelvic:  Cervical exam deferred        Extremities: Normal range of motion.  Edema: None  Mental Status: Normal mood and affect. Normal behavior. Normal judgment and thought content.   Urinalysis:      Assessment and Plan:  Pregnancy: G1 at 2423w3d  1. Encounter for supervision of low-risk pregnancy in second trimester Anatomy scan scheduled for late January already. - AFP, Quad Screen  2. Marijuana abuse D/w pt re: +THC and methadone on UDS at [facility]. She states she does smoke THC regularly and risk with the pregnancy d/w her, specifically PTL/PTB, FGR, HTN, etc and recommended cessation. I also d/w her re: the +methadone. 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 I told her the dangers of taking medications that she's not sure of and also d/w her re: the risks of NSAID use; she states that she rarely took it before learning today it was bad with last use last week.   Preterm labor symptoms and general obstetric precautions including but not limited to vaginal bleeding, contractions, leaking of fluid and fetal movement were reviewed in detail with the patient. Please refer to After Visit Summary for other counseling recommendations.  Return in about 4 weeks (around 05/24/2016) for rob.   [place] Bingharlie Kiona Blume, MD

## 2016-04-26 NOTE — Progress Notes (Signed)
C/o lower back pain, states has history of back pain before pregnancy. Discussed getting maternity support belt.

## 2016-04-26 NOTE — Progress Notes (Addendum)
Patient has not initated Soil scientistBAby scripts. States her phone messed up and hasn't checked email and wants it switched to her boyfriends phone, but because she has been signed up will not let me resign her up. Will forward to Production designer, theatre/television/filmmanager.   Medicaid home form completed

## 2016-04-27 LAB — AFP, QUAD SCREEN
AFP: 45.5 ng/mL
Age Alone: 1:1170 {titer}
Curr Gest Age: 17.4 weeks
Down Syndrome Scr Risk Est: 1:13600 {titer}
HCG, Total: 25.2 IU/mL
INH: 187.4 pg/mL
Interpretation-AFP: NEGATIVE
MoM for AFP: 1.08
MoM for INH: 1.18
MoM for hCG: 0.88
Open Spina bifida: NEGATIVE
Osb Risk: 1:20400 {titer}
Tri 18 Scr Risk Est: NEGATIVE
Trisomy 18 (Edward) Syndrome Interp.: 1:63900 {titer}
uE3 Mom: 0.92
uE3 Value: 0.96 ng/mL

## 2016-05-01 NOTE — L&D Delivery Note (Signed)
21 y.o. G1P0000 at 7271w2d delivered a viable female infant in cephalic, OP position. loose nuchal cord, easily reduced.  anterior shoulder delivered with ease. 60 sec delayed cord clamping. Cord clamped x2 and cut. Placenta delivered spontaneously intact, with 3VC. Fundus firm on exam with massage and pitocin. Good hemostasis noted.  Anesthesia: Epidural Laceration: bilateral labial and 1st degree sulcal extending to vagina Suture: 3.0 Vicryl Good hemostasis noted. EBL: 200 cc  Mom and baby recovering in LDR.    Apgars: APGAR (1 MIN): 7   APGAR (5 MINS): 9    Weight: Pending skin to skin  Sponge and instrument count were correct x2. Placenta sent to L&D.  Howard PouchLauren Feng, MD PGY-1 Family Medicine 10/03/2016, 3:00 PM   OB FELLOW DELIVERY ATTESTATION  I was gloved and present for the delivery in its entirety, and I agree with the above resident's note.    Jen MowElizabeth Aunica Dauphinee, DO OB Fellow

## 2016-05-19 ENCOUNTER — Encounter (HOSPITAL_COMMUNITY): Payer: Self-pay | Admitting: Obstetrics and Gynecology

## 2016-05-24 ENCOUNTER — Ambulatory Visit (INDEPENDENT_AMBULATORY_CARE_PROVIDER_SITE_OTHER): Payer: Medicaid Other | Admitting: Obstetrics and Gynecology

## 2016-05-24 VITALS — BP 111/64 | HR 64 | Wt 180.0 lb

## 2016-05-24 DIAGNOSIS — Z3492 Encounter for supervision of normal pregnancy, unspecified, second trimester: Secondary | ICD-10-CM

## 2016-05-24 NOTE — Progress Notes (Signed)
Prenatal Visit Note Date: 05/24/2016 Clinic: [facility]  Subjective:  Marisa Page is a 21 y.o. G1P0000 at 4484w3d being seen today for ongoing prenatal care.  She is currently monitored for the following issues for this low-risk pregnancy and has Supervision of low-risk pregnancy and Marijuana abuse on her problem list.  Patient reports no complaints.   Contractions: Not present. Vag. Bleeding: None.  Movement: Present. Denies leaking of fluid.   The following portions of the patient's history were reviewed and updated as appropriate: allergies, current medications, past family history, past medical history, past social history, past surgical history and problem list. Problem list updated.  Objective:   Vitals:   05/24/16 1034  BP: 111/64  Pulse: 64  Weight: 180 lb (81.6 kg)    Fetal Status: Fetal Heart Rate (bpm): 145   Movement: Present     General:  Alert, oriented and cooperative. Patient is in no acute distress.  Skin: Skin is warm and dry. No rash noted.   Cardiovascular: Normal heart rate noted  Respiratory: Normal respiratory effort, no problems with respiration noted  Abdomen: Soft, gravid, appropriate for gestational age. Pain/Pressure: Absent     Pelvic:  Cervical exam deferred        Extremities: Normal range of motion.  Edema: None  Mental Status: Normal mood and affect. Normal behavior. Normal judgment and thought content.   Urinalysis:      Assessment and Plan:  Pregnancy: G1P0000 at 8084w3d  Routine care. Quad negative last visit. F/u anatomy scan next week. 28wk labs nv. Pt told to be fasting. Rpt UDS nv  Preterm labor symptoms and general obstetric precautions including but not limited to vaginal bleeding, contractions, leaking of fluid and fetal movement were reviewed in detail with the patient. Please refer to After Visit Summary for other counseling recommendations.  Return for per baby scripts. 2hr GTT nv. .   [place] Bingharlie  Lucah Petta, MD

## 2016-05-29 ENCOUNTER — Ambulatory Visit (HOSPITAL_COMMUNITY): Payer: Medicaid Other

## 2016-05-31 ENCOUNTER — Ambulatory Visit (HOSPITAL_COMMUNITY)
Admission: RE | Admit: 2016-05-31 | Discharge: 2016-05-31 | Disposition: A | Discharge status: Home / Self Care | Payer: Medicaid Other | Source: Ambulatory Visit | Attending: Obstetrics and Gynecology | Admitting: Obstetrics and Gynecology

## 2016-05-31 ENCOUNTER — Other Ambulatory Visit: Payer: Self-pay | Admitting: Obstetrics and Gynecology

## 2016-05-31 DIAGNOSIS — Z3A22 22 weeks gestation of pregnancy: Secondary | ICD-10-CM

## 2016-05-31 DIAGNOSIS — Z363 Encounter for antenatal screening for malformations: Secondary | ICD-10-CM | POA: Insufficient documentation

## 2016-05-31 DIAGNOSIS — Z3492 Encounter for supervision of normal pregnancy, unspecified, second trimester: Secondary | ICD-10-CM

## 2016-07-06 ENCOUNTER — Encounter: Payer: Self-pay | Admitting: Obstetrics and Gynecology

## 2016-07-06 ENCOUNTER — Encounter: Payer: Medicaid Other | Admitting: Obstetrics and Gynecology

## 2016-07-06 NOTE — Progress Notes (Signed)
Patient did not keep OB appointment for 07/06/2016.  Florella Mcneese, Jr MD Attending [facility] (Faculty Practice)   

## 2016-07-11 ENCOUNTER — Encounter: Payer: Medicaid Other | Admitting: Advanced Practice Midwife

## 2016-07-18 ENCOUNTER — Ambulatory Visit (INDEPENDENT_AMBULATORY_CARE_PROVIDER_SITE_OTHER): Payer: Medicaid Other | Admitting: Advanced Practice Midwife

## 2016-07-18 VITALS — BP 104/66 | HR 75 | Wt 197.4 lb

## 2016-07-18 DIAGNOSIS — O26893 Other specified pregnancy related conditions, third trimester: Secondary | ICD-10-CM

## 2016-07-18 DIAGNOSIS — O9989 Other specified diseases and conditions complicating pregnancy, childbirth and the puerperium: Secondary | ICD-10-CM | POA: Diagnosis not present

## 2016-07-18 DIAGNOSIS — Z113 Encounter for screening for infections with a predominantly sexual mode of transmission: Secondary | ICD-10-CM

## 2016-07-18 DIAGNOSIS — Z23 Encounter for immunization: Secondary | ICD-10-CM | POA: Diagnosis not present

## 2016-07-18 DIAGNOSIS — N898 Other specified noninflammatory disorders of vagina: Secondary | ICD-10-CM

## 2016-07-18 DIAGNOSIS — Z3403 Encounter for supervision of normal first pregnancy, third trimester: Secondary | ICD-10-CM

## 2016-07-18 DIAGNOSIS — Z3493 Encounter for supervision of normal pregnancy, unspecified, third trimester: Secondary | ICD-10-CM

## 2016-07-18 NOTE — Patient Instructions (Signed)
Third Trimester of Pregnancy The third trimester is from week 28 through week 40 (months 7 through 9). The third trimester is a time when the unborn baby (fetus) is growing rapidly. At the end of the ninth month, the fetus is about 20 inches in length and weighs 6-10 pounds. Body changes during your third trimester Your body will continue to go through many changes during pregnancy. The changes vary from woman to woman. During the third trimester:  Your weight will continue to increase. You can expect to gain 25-35 pounds (11-16 kg) by the end of the pregnancy.  You may begin to get stretch marks on your hips, abdomen, and breasts.  You may urinate more often because the fetus is moving lower into your pelvis and pressing on your bladder.  You may develop or continue to have heartburn. This is caused by increased hormones that slow down muscles in the digestive tract.  You may develop or continue to have constipation because increased hormones slow digestion and cause the muscles that push waste through your intestines to relax.  You may develop hemorrhoids. These are swollen veins (varicose veins) in the rectum that can itch or be painful.  You may develop swollen, bulging veins (varicose veins) in your legs.  You may have increased body aches in the pelvis, back, or thighs. This is due to weight gain and increased hormones that are relaxing your joints.  You may have changes in your hair. These can include thickening of your hair, rapid growth, and changes in texture. Some women also have hair loss during or after pregnancy, or hair that feels dry or thin. Your hair will most likely return to normal after your baby is born.  Your breasts will continue to grow and they will continue to become tender. A yellow fluid (colostrum) may leak from your breasts. This is the first milk you are producing for your baby.  Your belly button may stick out.  You may notice more swelling in your hands,  face, or ankles.  You may have increased tingling or numbness in your hands, arms, and legs. The skin on your belly may also feel numb.  You may feel short of breath because of your expanding uterus.  You may have more problems sleeping. This can be caused by the size of your belly, increased need to urinate, and an increase in your body's metabolism.  You may notice the fetus "dropping," or moving lower in your abdomen (lightening).  You may have increased vaginal discharge.  You may notice your joints feel loose and you may have pain around your pelvic bone.  What to expect at prenatal visits You will have prenatal exams every 2 weeks until week 36. Then you will have weekly prenatal exams. During a routine prenatal visit:  You will be weighed to make sure you and the baby are growing normally.  Your blood pressure will be taken.  Your abdomen will be measured to track your baby's growth.  The fetal heartbeat will be listened to.  Any test results from the previous visit will be discussed.  You may have a cervical check near your due date to see if your cervix has softened or thinned (effaced).  You will be tested for Group B streptococcus. This happens between 35 and 37 weeks.  Your health care provider may ask you:  What your birth plan is.  How you are feeling.  If you are feeling the baby move.  If you have had   any abnormal symptoms, such as leaking fluid, bleeding, severe headaches, or abdominal cramping.  If you are using any tobacco products, including cigarettes, chewing tobacco, and electronic cigarettes.  If you have any questions.  Other tests or screenings that may be performed during your third trimester include:  Blood tests that check for low iron levels (anemia).  Fetal testing to check the health, activity level, and growth of the fetus. Testing is done if you have certain medical conditions or if there are problems during the  pregnancy.  Nonstress test (NST). This test checks the health of your baby to make sure there are no signs of problems, such as the baby not getting enough oxygen. During this test, a belt is placed around your belly. The baby is made to move, and its heart rate is monitored during movement.  What is false labor? False labor is a condition in which you feel small, irregular tightenings of the muscles in the womb (contractions) that usually go away with rest, changing position, or drinking water. These are called Braxton Hicks contractions. Contractions may last for hours, days, or even weeks before true labor sets in. If contractions come at regular intervals, become more frequent, increase in intensity, or become painful, you should see your health care provider. What are the signs of labor?  Abdominal cramps.  Regular contractions that start at 10 minutes apart and become stronger and more frequent with time.  Contractions that start on the top of the uterus and spread down to the lower abdomen and back.  Increased pelvic pressure and dull back pain.  A watery or bloody mucus discharge that comes from the vagina.  Leaking of amniotic fluid. This is also known as your "water breaking." It could be a slow trickle or a gush. Let your health care provider know if it has a color or strange odor. If you have any of these signs, call your health care provider right away, even if it is before your due date. Follow these instructions at home: Medicines  Follow your health care provider's instructions regarding medicine use. Specific medicines may be either safe or unsafe to take during pregnancy.  Take a prenatal vitamin that contains at least 600 micrograms (mcg) of folic acid.  If you develop constipation, try taking a stool softener if your health care provider approves. Eating and drinking  Eat a balanced diet that includes fresh fruits and vegetables, whole grains, good sources of protein  such as meat, eggs, or tofu, and low-fat dairy. Your health care provider will help you determine the amount of weight gain that is right for you.  Avoid raw meat and uncooked cheese. These carry germs that can cause birth defects in the baby.  If you have low calcium intake from food, talk to your health care provider about whether you should take a daily calcium supplement.  Eat four or five small meals rather than three large meals a day.  Limit foods that are high in fat and processed sugars, such as fried and sweet foods.  To prevent constipation: ? Drink enough fluid to keep your urine clear or pale yellow. ? Eat foods that are high in fiber, such as fresh fruits and vegetables, whole grains, and beans. Activity  Exercise only as directed by your health care provider. Most women can continue their usual exercise routine during pregnancy. Try to exercise for 30 minutes at least 5 days a week. Stop exercising if you experience uterine contractions.  Avoid heavy   lifting.  Do not exercise in extreme heat or humidity, or at high altitudes.  Wear low-heel, comfortable shoes.  Practice good posture.  You may continue to have sex unless your health care provider tells you otherwise. Relieving pain and discomfort  Take frequent breaks and rest with your legs elevated if you have leg cramps or low back pain.  Take warm sitz baths to soothe any pain or discomfort caused by hemorrhoids. Use hemorrhoid cream if your health care provider approves.  Wear a good support bra to prevent discomfort from breast tenderness.  If you develop varicose veins: ? Wear support pantyhose or compression stockings as told by your healthcare provider. ? Elevate your feet for 15 minutes, 3-4 times a day. Prenatal care  Write down your questions. Take them to your prenatal visits.  Keep all your prenatal visits as told by your health care provider. This is important. Safety  Wear your seat belt at  all times when driving.  Make a list of emergency phone numbers, including numbers for family, friends, the hospital, and police and fire departments. General instructions  Avoid cat litter boxes and soil used by cats. These carry germs that can cause birth defects in the baby. If you have a cat, ask someone to clean the litter box for you.  Do not travel far distances unless it is absolutely necessary and only with the approval of your health care provider.  Do not use hot tubs, steam rooms, or saunas.  Do not drink alcohol.  Do not use any products that contain nicotine or tobacco, such as cigarettes and e-cigarettes. If you need help quitting, ask your health care provider.  Do not use any medicinal herbs or unprescribed drugs. These chemicals affect the formation and growth of the baby.  Do not douche or use tampons or scented sanitary pads.  Do not cross your legs for long periods of time.  To prepare for the arrival of your baby: ? Take prenatal classes to understand, practice, and ask questions about labor and delivery. ? Make a trial run to the hospital. ? Visit the hospital and tour the maternity area. ? Arrange for maternity or paternity leave through employers. ? Arrange for family and friends to take care of pets while you are in the hospital. ? Purchase a rear-facing car seat and make sure you know how to install it in your car. ? Pack your hospital bag. ? Prepare the baby's nursery. Make sure to remove all pillows and stuffed animals from the baby's crib to prevent suffocation.  Visit your dentist if you have not gone during your pregnancy. Use a soft toothbrush to brush your teeth and be gentle when you floss. Contact a health care provider if:  You are unsure if you are in labor or if your water has broken.  You become dizzy.  You have mild pelvic cramps, pelvic pressure, or nagging pain in your abdominal area.  You have lower back pain.  You have persistent  nausea, vomiting, or diarrhea.  You have an unusual or bad smelling vaginal discharge.  You have pain when you urinate. Get help right away if:  Your water breaks before 37 weeks.  You have regular contractions less than 5 minutes apart before 37 weeks.  You have a fever.  You are leaking fluid from your vagina.  You have spotting or bleeding from your vagina.  You have severe abdominal pain or cramping.  You have rapid weight loss or weight gain.    You have shortness of breath with chest pain.  You notice sudden or extreme swelling of your face, hands, ankles, feet, or legs.  Your baby makes fewer than 10 movements in 2 hours.  You have severe headaches that do not go away when you take medicine.  You have vision changes. Summary  The third trimester is from week 28 through week 40, months 7 through 9. The third trimester is a time when the unborn baby (fetus) is growing rapidly.  During the third trimester, your discomfort may increase as you and your baby continue to gain weight. You may have abdominal, leg, and back pain, sleeping problems, and an increased need to urinate.  During the third trimester your breasts will keep growing and they will continue to become tender. A yellow fluid (colostrum) may leak from your breasts. This is the first milk you are producing for your baby.  False labor is a condition in which you feel small, irregular tightenings of the muscles in the womb (contractions) that eventually go away. These are called Braxton Hicks contractions. Contractions may last for hours, days, or even weeks before true labor sets in.  Signs of labor can include: abdominal cramps; regular contractions that start at 10 minutes apart and become stronger and more frequent with time; watery or bloody mucus discharge that comes from the vagina; increased pelvic pressure and dull back pain; and leaking of amniotic fluid. This information is not intended to replace advice  given to you by your health care provider. Make sure you discuss any questions you have with your health care provider. Document Released: 04/11/2001 Document Revised: 09/23/2015 Document Reviewed: 06/18/2012 Elsevier Interactive Patient Education  2017 [employer] Inc[employer]  

## 2016-07-18 NOTE — Progress Notes (Signed)
   PRENATAL VISIT NOTE  Subjective:  Marisa Page is a 21 y.o. G1P0000 at 7128w2d being seen today for ongoing prenatal care.  She is currently monitored for the following issues for this low-risk pregnancy and has Supervision of low-risk pregnancy and Marijuana abuse on her problem list.  Patient reports vaginal irritation.  Contractions: Not present. Vag. Bleeding: None.  Movement: Present. Denies leaking of fluid.   The following portions of the patient's history were reviewed and updated as appropriate: allergies, current medications, past family history, past medical history, past social history, past surgical history and problem list. Problem list updated.  Objective:   Vitals:   07/18/16 0820  BP: 104/66  Pulse: 75  Weight: 197 lb 6.4 oz (89.5 kg)    Fetal Status: Fetal Heart Rate (bpm): 138 Fundal Height: 29 cm Movement: Present  Presentation: Vertex  General:  Alert, oriented and cooperative. Patient is in no acute distress.  Skin: Skin is warm and dry. No rash noted.   Cardiovascular: Normal heart rate noted  Respiratory: Normal respiratory effort, no problems with respiration noted  Abdomen: Soft, gravid, appropriate for gestational age. Pain/Pressure: Absent     Pelvic:  Cervical exam deferred      Scant white discharge  Extremities: Normal range of motion.  Edema: None  Mental Status: Normal mood and affect. Normal behavior. Normal judgment and thought content.   Assessment and Plan:  Pregnancy: G1P0000 at 6228w2d  1. Supervision of low-risk pregnancy, third trimester  - Glucose Tolerance, 2 Hours w/1 Hour - HIV antibody (with reflex) - CBC - RPR - POCT Urine Drug Screen  2. Need for Tdap vaccination  - Tdap vaccine greater than or equal to 7yo IM  3. Encounter for supervision of low-risk pregnancy in third trimester   4. Vaginal discharge during pregnancy in third trimester  - Cervicovaginal ancillary only  Preterm labor symptoms and general  obstetric precautions including but not limited to vaginal bleeding, contractions, leaking of fluid and fetal movement were reviewed in detail with the patient. Please refer to After Visit Summary for other counseling recommendations.  Return in about 4 weeks (around 08/15/2016) for ROB.   Dorathy KinsmanVirginia Aarik Blank, CNM

## 2016-07-19 LAB — GLUCOSE TOLERANCE, 2 HOURS W/ 1HR
Glucose, 1 hour: 92 mg/dL (ref 65–179)
Glucose, 2 hour: 102 mg/dL (ref 65–152)
Glucose, Fasting: 74 mg/dL (ref 65–91)

## 2016-07-19 LAB — CBC
Hematocrit: 33.6 % — ABNORMAL LOW (ref 34.0–46.6)
Hemoglobin: 11.1 g/dL (ref 11.1–15.9)
MCH: 30.7 pg (ref 26.6–33.0)
MCHC: 33 g/dL (ref 31.5–35.7)
MCV: 93 fL (ref 79–97)
Platelets: 217 10*3/uL (ref 150–379)
RBC: 3.62 x10E6/uL — ABNORMAL LOW (ref 3.77–5.28)
RDW: 14.1 % (ref 12.3–15.4)
WBC: 11.1 10*3/uL — ABNORMAL HIGH (ref 3.4–10.8)

## 2016-07-19 LAB — CERVICOVAGINAL ANCILLARY ONLY
Bacterial vaginitis: NEGATIVE
Candida vaginitis: POSITIVE — AB
Chlamydia: NEGATIVE
Neisseria Gonorrhea: NEGATIVE
Trichomonas: NEGATIVE

## 2016-07-19 LAB — RPR: RPR Ser Ql: NONREACTIVE

## 2016-07-19 LAB — HIV ANTIBODY (ROUTINE TESTING W REFLEX): HIV Screen 4th Generation wRfx: NONREACTIVE

## 2016-07-20 ENCOUNTER — Telehealth: Payer: Self-pay | Admitting: General Practice

## 2016-07-20 DIAGNOSIS — B379 Candidiasis, unspecified: Secondary | ICD-10-CM

## 2016-07-20 MED ORDER — TERCONAZOLE 0.4 % VA CREA
1.0000 | TOPICAL_CREAM | Freq: Every day | VAGINAL | 1 refills | Status: AC
Start: 2016-07-20 — End: 2016-07-27

## 2016-07-20 NOTE — Telephone Encounter (Signed)
Per Marisa KinsmanVirginia Page, patient has yeast infection. May use OTC monistat 7 or send in Rx for terazol 7 x 7 nights with 1 refill. Called and informed patient. Patient requests Rx be sent to pharmacy. Order placed. Patient had no questions

## 2016-08-15 ENCOUNTER — Ambulatory Visit (INDEPENDENT_AMBULATORY_CARE_PROVIDER_SITE_OTHER): Payer: Medicaid Other | Admitting: Advanced Practice Midwife

## 2016-08-15 DIAGNOSIS — Z3403 Encounter for supervision of normal first pregnancy, third trimester: Secondary | ICD-10-CM

## 2016-08-15 DIAGNOSIS — Z3493 Encounter for supervision of normal pregnancy, unspecified, third trimester: Secondary | ICD-10-CM

## 2016-08-15 NOTE — Progress Notes (Signed)
   PRENATAL VISIT NOTE  Subjective:  Marisa Page is a 21 y.o. G1P0000 at [redacted]w[redacted]d being seen today for ongoing prenatal care.  She is currently monitored for the following issues for this low-risk pregnancy and has Supervision of low-risk pregnancy and Marijuana abuse on her problem list.  Patient reports pelvic pain when sleeping that she attributes to her pillowtop matress. C/o dry mouth. Resolves w/ standing. Contractions: Not present. Vag. Bleeding: None.  Movement: Present. Denies leaking of fluid.   The following portions of the patient's history were reviewed and updated as appropriate: allergies, current medications, past family history, past medical history, past social history, past surgical history and problem list. Problem list updated.  Objective:   Vitals:   08/15/16 1020  BP: 101/60  Pulse: 74  Weight: 200 lb 9.6 oz (91 kg)    Fetal Status: Fetal Heart Rate (bpm): 136 Fundal Height: 33 cm Movement: Present     General:  Alert, oriented and cooperative. Patient is in no acute distress.  Skin: Skin is warm and dry. No rash noted.   Cardiovascular: Normal heart rate noted  Respiratory: Normal respiratory effort, no problems with respiration noted  Abdomen: Soft, gravid, appropriate for gestational age. Pain/Pressure: Present     Pelvic:  Cervical exam declined        Extremities: Normal range of motion.  Edema: None  Mental Status: Normal mood and affect. Normal behavior. Normal judgment and thought content.   Assessment and Plan:  Pregnancy: G1P0000 at [redacted]w[redacted]d  1. Encounter for supervision of low-risk pregnancy in third trimester - Comfort measures.  Biotene for dry mouth  Preterm labor symptoms and general obstetric precautions including but not limited to vaginal bleeding, contractions, leaking of fluid and fetal movement were reviewed in detail with the patient. Please refer to After Visit Summary for other counseling recommendations.  Return in about  3 weeks (around 09/05/2016) for ROB.   Dorathy Kinsman, CNM

## 2016-08-15 NOTE — Patient Instructions (Addendum)
Biotene for dry mouth  Braxton Hicks Contractions Contractions of the uterus can occur throughout pregnancy, but they are not always a sign that you are in labor. You may have practice contractions called Braxton Hicks contractions. These false labor contractions are sometimes confused with true labor. What are Deberah Pelton contractions? Braxton Hicks contractions are tightening movements that occur in the muscles of the uterus before labor. Unlike true labor contractions, these contractions do not result in opening (dilation) and thinning of the cervix. Toward the end of pregnancy (32-34 weeks), Braxton Hicks contractions can happen more often and may become stronger. These contractions are sometimes difficult to tell apart from true labor because they can be very uncomfortable. You should not feel embarrassed if you go to the hospital with false labor. Sometimes, the only way to tell if you are in true labor is for your health care provider to look for changes in the cervix. The health care provider will do a physical exam and may monitor your contractions. If you are not in true labor, the exam should show that your cervix is not dilating and your water has not broken. If there are no prenatal problems or other health problems associated with your pregnancy, it is completely safe for you to be sent home with false labor. You may continue to have Braxton Hicks contractions until you go into true labor. How can I tell the difference between true labor and false labor?  Differences  False labor  Contractions last 30-70 seconds.: Contractions are usually shorter and not as strong as true labor contractions.  Contractions become very regular.: Contractions are usually irregular.  Discomfort is usually felt in the top of the uterus, and it spreads to the lower abdomen and low back.: Contractions are often felt in the front of the lower abdomen and in the groin.  Contractions do not go away with  walking.: Contractions may go away when you walk around or change positions while lying down.  Contractions usually become more intense and increase in frequency.: Contractions get weaker and are shorter-lasting as time goes on.  The cervix dilates and gets thinner.: The cervix usually does not dilate or become thin. Follow these instructions at home:  Take over-the-counter and prescription medicines only as told by your health care provider.  Keep up with your usual exercises and follow other instructions from your health care provider.  Eat and drink lightly if you think you are going into labor.  If Braxton Hicks contractions are making you uncomfortable:  Change your position from lying down or resting to walking, or change from walking to resting.  Sit and rest in a tub of warm water.  Drink enough fluid to keep your urine clear or pale yellow. Dehydration may cause these contractions.  Do slow and deep breathing several times an hour.  Keep all follow-up prenatal visits as told by your health care provider. This is important. Contact a health care provider if:  You have a fever.  You have continuous pain in your abdomen. Get help right away if:  Your contractions become stronger, more regular, and closer together.  You have fluid leaking or gushing from your vagina.  You pass blood-tinged mucus (bloody show).  You have bleeding from your vagina.  You have low back pain that you never had before.  You feel your baby's head pushing down and causing pelvic pressure.  Your baby is not moving inside you as much as it used to. Summary  Contractions  that occur before labor are called Braxton Hicks contractions, false labor, or practice contractions.  Braxton Hicks contractions are usually shorter, weaker, farther apart, and less regular than true labor contractions. True labor contractions usually become progressively stronger and regular and they become more  frequent.  Manage discomfort from [facility] contractions by changing position, resting in a warm bath, drinking plenty of water, or practicing deep breathing. This information is not intended to replace advice given to you by your health care provider. Make sure you discuss any questions you have with your health care provider. Document Released: 04/17/2005 Document Revised: 03/06/2016 Document Reviewed: 03/06/2016 Elsevier Interactive Patient Education  2017 [employer]

## 2016-09-05 ENCOUNTER — Encounter: Payer: Self-pay | Admitting: Advanced Practice Midwife

## 2016-09-05 ENCOUNTER — Other Ambulatory Visit (HOSPITAL_COMMUNITY)
Admission: RE | Admit: 2016-09-05 | Discharge: 2016-09-05 | Disposition: A | Discharge status: Home / Self Care | Payer: Medicaid Other | Source: Ambulatory Visit | Attending: Advanced Practice Midwife | Admitting: Advanced Practice Midwife

## 2016-09-05 ENCOUNTER — Ambulatory Visit (INDEPENDENT_AMBULATORY_CARE_PROVIDER_SITE_OTHER): Payer: Medicaid Other | Admitting: Advanced Practice Midwife

## 2016-09-05 VITALS — BP 114/63 | HR 67 | Wt 205.4 lb

## 2016-09-05 DIAGNOSIS — Z124 Encounter for screening for malignant neoplasm of cervix: Secondary | ICD-10-CM | POA: Diagnosis not present

## 2016-09-05 DIAGNOSIS — F1290 Cannabis use, unspecified, uncomplicated: Secondary | ICD-10-CM

## 2016-09-05 DIAGNOSIS — Z113 Encounter for screening for infections with a predominantly sexual mode of transmission: Secondary | ICD-10-CM

## 2016-09-05 DIAGNOSIS — Z3493 Encounter for supervision of normal pregnancy, unspecified, third trimester: Secondary | ICD-10-CM

## 2016-09-05 DIAGNOSIS — F1210 Cannabis abuse, uncomplicated: Secondary | ICD-10-CM

## 2016-09-05 LAB — OB RESULTS CONSOLE GBS: GBS: NEGATIVE

## 2016-09-05 LAB — OB RESULTS CONSOLE GC/CHLAMYDIA: Gonorrhea: NEGATIVE

## 2016-09-05 NOTE — Patient Instructions (Addendum)
AREA PEDIATRIC/FAMILY PRACTICE PHYSICIANS  [facility] [address] [place], [place]  [address] Phone - [phone]   Fax - [phone]  [facility] [address] [place], [place] [address] Phone - [phone]   Fax - [phone]  [facility] [address] [place], [place]  [address] Phone - [phone]   Fax - [phone]  [facility] [address] [place], [place]  [address] Phone - [phone]   Fax - [phone]  [facility] [address] [place], [place]  [address] Phone - [phone]   Fax - [phone]  [facility] [address] [place], [place]  [address] Phone - [phone]   Fax - [phone]  [facility] [address] [place], [place]  [address] Phone - [phone]   Fax - [phone]  [facility] [address] [place], [address] [place], [place]  [address] Phone - [phone]   Fax - [phone]  [facility] AT [facility] [address] [place], [place]  [address] Phone - [phone]   Fax - [phone] [facility] [place] [address] [place], [place]  [address] Phone - [phone]   Fax - [phone]  [facility] [address] [place], [place]  [address] Phone - [phone]   Fax - [phone]  [facility] [address], [address] [place], [place]  [address] Phone - [phone]   Fax - [phone]  [facility] [address] [place], [place]  [address] Phone - [phone]   Fax - [phone]  [facility] [address] [place], [place]  [address] Phone - [phone]  [facility] [address] [place], [place]  [address] Phone - [phone]   Fax - [phone]  [facility] [address] [place], [place]  [address] Phone - [phone]   Fax - [phone]  [facility] [address] [place], [place]  [address] Phone - [phone]   Fax - [phone]  [facility] [address] [place], [place]  [address] Phone - [phone]   Fax - [phone]   [facility] [address] [place] [address] [place], [place]  [address] Phone - [phone]   Fax - [phone]  [facility] [address] [place], [place]  [address] Phone - [phone]   Fax - [phone]  DAVID RUBIN [address] [place], [place]  [address] Phone - [phone]   Fax - [phone]  [facility] [address] [place], [place]  [address] Phone - [phone]   Fax - [phone]  [facility] Al[facility] [address] [place], [place]  [address] Phone - [phone]   Fax - [phone] Gerarda Fraction [address] [place], [place]  [address] Phone - [phone]   Fax - [phone]  [facility] [address] [place], [place]  [address] Phone - [phone]   Fax - [phone]  [facility] -  [address] [place], [place]  [address] Phone - [phone]   Fax - [phone]  [facility] [place] Wyvonne Lenz MD [address] [place] [address] Phone [phone]  Fax [phone]    Breastfeeding Challenges and Solutions Even though breastfeeding is natural, it can be challenging,  especially in the first few weeks after childbirth. It is normal for problems to arise when starting to breastfeed your new baby, even if you have breastfed before. This document provides some solutions to the most common breastfeeding challenges. Challenges and solutions Challenge-Cracked or Sore Nipples  Cracked or sore nipples are commonly experienced by breastfeeding mothers. Cracked  or sore nipples often are caused by inadequate latching (when your baby's mouth attaches to your breast to breastfeed). Soreness can also happen if your baby is not positioned properly at your breast. Although nipple cracking and soreness are common during the first week after birth, nipple pain is never normal. If you experience nipple cracking or soreness that lasts longer than 1 week or nipple pain, call your health care provider or lactation consultant. Solution  Ensure proper latching and positioning of your baby by following the steps below:  Find a comfortable place to sit or lie down, with your neck and back well supported.  Place a pillow or rolled up blanket under your baby to bring him or her to the level of your breast (if you are seated).  Make sure that your baby's abdomen is facing your abdomen.  Gently massage your breast. With your fingertips, massage from your chest wall toward your nipple in a circular motion. This encourages milk flow. You may need to continue this action during the feeding if your milk flows slowly.  Support your breast with 4 fingers underneath and your thumb above your nipple. Make sure your fingers are well away from your nipple and your baby's mouth.  Stroke your baby's lips gently with your finger or nipple.  When your baby's mouth is open wide enough, quickly bring your baby to your breast, placing your entire nipple and as much of the colored area around your nipple (areola) as possible into your baby's mouth.  More areola should be visible above your baby's upper lip than below the lower lip.  Your baby's tongue should be between his or her lower gum and your breast.  Ensure that your baby's mouth is correctly positioned around your nipple (latched). Your baby's lips should create a seal on your breast and be turned out (everted).  It is common for your baby to suck for about 2-3 minutes in order to start the flow of breast milk. Signs that your  baby has successfully latched on to your nipple include:  Quietly tugging or quietly sucking without causing you pain.  Swallowing heard between every 3-4 sucks.  Muscle movement above and in front of his or her ears with sucking. Signs that your baby has not successfully latched on to nipple include:  Sucking sounds or smacking sounds from your baby while nursing.  Nipple pain. Ensure that your breasts stay moisturized and healthy by:  Avoiding the use of soap on your nipples.  Wearing a supportive bra. Avoid wearing underwire-style bras or tight bras.  Air drying your nipples for 3-4 minutes after each feeding.  Using only cotton bra pads to absorb breast milk leakage. Leaking of breast milk between feedings is normal. Be sure to change the pads if they become soaked with milk.  Using lanolin on your nipples after nursing. Lanolin helps to maintain your skin's normal moisture barrier. If you use pure lanolin you do not need to wash it off before feeding your baby again. Pure lanolin is not toxic to your baby. You may also hand express a few drops of breast milk and gently massage that milk  into your nipples, allowing it to air dry. Challenge-Breast Engorgement  Breast engorgement is the overfilling of your breasts with breast milk. In the first few weeks after giving birth, you may experience breast engorgement. Breast engorgement can make your breasts throb and feel hard, tightly stretched, warm, and tender. Engorgement peaks about the fifth day after you give birth. Having breast engorgement does not mean you have to stop breastfeeding your baby. Solution  Breastfeed when you feel the need to reduce the fullness of your breasts or when your baby shows signs of hunger. This is called "breastfeeding on demand."  Newborns (babies younger than 4 weeks) often breastfeed every 1-3 hours during the day. You may need to awaken your baby to feed if he or she is asleep at a feeding time.  Do  not allow your baby to sleep longer than 5 hours during the night without a feeding.  Pump or hand express breast milk before breastfeeding to soften your breast, areola, and nipple.  Apply warm, moist heat (in the shower or with warm water-soaked hand towels) just before feeding or pumping, or massage your breast before or during breastfeeding. This increases circulation and helps your milk to flow.  Completely empty your breasts when breastfeeding or pumping. Afterward, wear a snug bra (nursing or regular) or tank top for 1-2 days to signal your body to slightly decrease milk production. Only wear snug bras or tank tops to treat engorgement. Tight bras typically should be avoided by breastfeeding mothers. Once engorgement is relieved, return to wearing regular, loose-fitting clothes.  Apply ice packs to your breasts to lessen the pain from engorgement and relieve swelling, unless the ice is uncomfortable for you.  Do not delay feedings. Try to relax when it is time to feed your baby. This helps to trigger your "let-down reflex," which releases milk from your breast.  Ensure your baby is latched on to your breast and positioned properly while breastfeeding.  Allow your baby to remain at your breast as long as he or she is latched on well and actively sucking. Your baby will let you know when he or she is done breastfeeding by pulling away from your breast or falling asleep.  Avoid introducing bottles or pacifiers to your baby in the early weeks of breastfeeding. Wait to introduce these things until after resolving any breastfeeding challenges.  Try to pump your milk on the same schedule as when your baby would breastfeed if you are returning to work or away from home for an extended period.  Drink plenty of fluids to avoid dehydration, which can eventually put you at greater risk of breast engorgement. If you follow these suggestions, your engorgement should improve in 24-48 hours. If you are  still experiencing difficulty, call your lactation consultant or health care provider. Challenge-Plugged Milk Ducts  Plugged milk ducts occur when the duct does not drain milk effectively and becomes swollen. Wearing a tight-fitting nursing bra or having difficulty with latching may cause plugged milk ducts. Not drinking enough water (8-10 c [1.9-2.4 L] per day) can contribute to plugged milk ducts. Once a duct has become plugged, hard lumps, soreness, and redness may develop in your breast. Solution  Do not delay feedings. Feed your baby frequently and try to empty your breasts of milk at each feeding. Try breastfeeding from the affected side first so there is a better chance that the milk will drain completely from that breast. Apply warm, moist towels to your breasts for 5-10 minutes  before feeding. Alternatively, a hot shower right before breastfeeding can provide the moist heat that can encourage milk flow. Gentle massage of the sore area before and during a feeding may also help. Avoid wearing tight clothing or bras that put pressure on your breasts. Wear bras that offer good support to your breasts, but avoid underwire bras. If you have a plugged milk duct and develop a fever, you need to see your health care provider. Challenge-Mastitis  Mastitis is inflammation of your breast. It usually is caused by a bacterial infection and can cause flu-like symptoms. You may develop redness in your breast and a fever. Often when mastitis occurs, your breast becomes firm, warm, and very painful. The most common causes of mastitis are poor latching, ineffective sucking from your baby, consistent pressure on your breast (possibly from wearing a tight-fitting bra or shirt that restricts the milk flow), unusual stress or fatigue, or missed feedings. Solution  You will be given antibiotic medicine to treat the infection. It is still important to breastfeed frequently to empty your breasts. Continuing to breastfeed  while you recover from mastitis will not harm your baby. Make sure your baby is positioned properly during every feeding. Apply moist heat to your breasts for a few minutes before feeding to help the milk flow and to help your breasts empty more easily. Challenge-Thrush  Ginette Pitman is a yeast infection that can form on your nipples, in your breast, or in your baby's mouth. It causes itching, soreness, burning or stabbing pain, and sometimes a rash. Solution  You will be given a medicated ointment for your nipples, and your baby will be given a liquid medicine for his or her mouth. It is important that you and your baby are treated at the same time because thrush can be passed between you and your baby. Change disposable nursing pads often. Any bras, towels, or clothing that come in contact with infected areas of your body or your baby's body need to be washed in very hot water every day. Wash your hands and your baby's hands often. All pacifiers, bottle nipples, or toys your baby puts in his or her mouth should be boiled once a day for 20 minutes. After 1 week of treatment, discard pacifiers and bottle nipples and buy new ones. All breast pump parts that touch the milk need to be boiled for 20 minutes every day. Challenge-Low Milk Supply  You may not be producing enough milk if your baby is not gaining the proper amount of weight. Breast milk production is based on a supply-and-demand system. Your milk supply depends on how frequently and effectively your baby empties your breast. Solution  The more you breastfeed and pump, the more breast milk you will produce. It is important that your baby empties at least one of your breasts at each feeding. If this is not happening, then use a breast pump or hand express any milk that remains. This will help to drain as much milk as possible at each feeding. It will also signal your body to produce more milk. If your baby is not emptying your breasts, it may be due to  latching, sucking, or positioning problems. If low milk supply continues after addressing these issues, contact your health care provider or a lactation specialist as soon as possible. Challenge-Inverted or Flat Nipples  Some women have nipples that turn inward instead of protruding outward. Other women have nipples that are flat. Inverted or flat nipples can sometimes make it more difficult  for your baby to latch onto your breast. Solution  You may be given a small device that pulls out inverted nipples. This device should be applied right before your baby is brought to your breast. You can also try using a breast pump for a short time before placing the baby at your breast. The pump can pull your nipple outwards to help your infant latch more easily. The baby's sucking motion will help the inverted nipple protrude as well. If you have flat nipples, encourage your baby to latch onto your breast and feed frequently in the early days after birth. This will give your baby practice latching on correctly while your breast is still soft. When your milk supply increases, between the second and fifth day after birth and your breasts become full, your baby will have an easier time latching. Contact a lactation consultant if you still have concerns. She or he can teach you additional techniques to address breastfeeding problems related to nipple shape and position. Where to find more information: [employer]: www.llli.org This information is not intended to replace advice given to you by your health care provider. Make sure you discuss any questions you have with your health care provider. Document Released: 10/09/2005 Document Revised: 09/29/2015 Document Reviewed: 10/11/2012 Elsevier Interactive Patient Education  2017 [employer]

## 2016-09-05 NOTE — Progress Notes (Signed)
   PRENATAL VISIT NOTE  Subjective:  Marisa Page is a 21 y.o. G1P0000 at 7065w2d being seen today for ongoing prenatal care.  She is currently monitored for the following issues for this low-risk pregnancy and has Supervision of low-risk pregnancy and Marijuana abuse on her problem list.  Patient reports no complaints.  Contractions: Irritability. Vag. Bleeding: None.  Movement: Present. Denies leaking of fluid.   The following portions of the patient's history were reviewed and updated as appropriate: allergies, current medications, past family history, past medical history, past social history, past surgical history and problem list. Problem list updated.  Objective:   Vitals:   09/05/16 0903  BP: 114/63  Pulse: 67  Weight: 205 lb 6.4 oz (93.2 kg)    Fetal Status: Fetal Heart Rate (bpm): 130 Fundal Height: 36 cm Movement: Present     General:  Alert, oriented and cooperative. Patient is in no acute distress.  Skin: Skin is warm and dry. No rash noted.   Cardiovascular: Normal heart rate noted  Respiratory: Normal respiratory effort, no problems with respiration noted  Abdomen: Soft, gravid, appropriate for gestational age. Pain/Pressure: Present     Pelvic:  Cervical exam performed Dilation: Closed Effacement (%): 0 Station: Ballotable  Extremities: Normal range of motion.  Edema: None  Mental Status: Normal mood and affect. Normal behavior. Normal judgment and thought content.   Pelvic exam: Cervix pink, visually closed, without lesion, scant white creamy discharge, vaginal walls and external genitalia normal   Assessment and Plan:  Pregnancy: G1P0000 at 2465w2d  1. Encounter for supervision of low-risk pregnancy in third trimester Cervical exam performed. Lengthy discussion of return precautions for labor, rupture of membranes or vaginal bleeding. List of pediatricians given.    - Cytology - PAP - Strep Gp B Culture+Rflx  2. Marijuana use Repeat urine screen  today and discussed reason for screening.   - POCT Urine Drug Screen   Term labor symptoms and general obstetric precautions including but not limited to vaginal bleeding, contractions, leaking of fluid and fetal movement were reviewed in detail with the patient. Please refer to After Visit Summary for other counseling recommendations.  Return in about 2 weeks (around 09/19/2016) for ROB.   Rolm BookbinderNeill, Mikahla Wisor M, Student-MidWife

## 2016-09-08 LAB — CYTOLOGY - PAP
Chlamydia: NEGATIVE
Diagnosis: UNDETERMINED — AB
HPV: NOT DETECTED
Neisseria Gonorrhea: NEGATIVE

## 2016-09-09 LAB — STREP GP B CULTURE+RFLX: Strep Gp B Culture+Rflx: NEGATIVE

## 2016-09-18 ENCOUNTER — Encounter: Payer: Self-pay | Admitting: Advanced Practice Midwife

## 2016-09-18 DIAGNOSIS — R87610 Atypical squamous cells of undetermined significance on cytologic smear of cervix (ASC-US): Secondary | ICD-10-CM | POA: Insufficient documentation

## 2016-09-20 ENCOUNTER — Telehealth: Payer: Self-pay

## 2016-09-20 NOTE — Telephone Encounter (Signed)
-----   Message from [place], [place] sent at 09/18/2016  2:46 AM EDT ----- Please informed patient of Pap results. No additional testing needed. Next Pap recommended in 3 years per ASCCP guidelines.

## 2016-09-20 NOTE — Telephone Encounter (Signed)
Patient has been informed of test results PAP results and need to follow up in 3 years.

## 2016-09-22 ENCOUNTER — Ambulatory Visit (INDEPENDENT_AMBULATORY_CARE_PROVIDER_SITE_OTHER): Payer: Medicaid Other | Admitting: Obstetrics and Gynecology

## 2016-09-22 VITALS — BP 118/70 | HR 70 | Temp 98.0°F | Wt 215.3 lb

## 2016-09-22 DIAGNOSIS — Z3493 Encounter for supervision of normal pregnancy, unspecified, third trimester: Secondary | ICD-10-CM

## 2016-09-22 NOTE — Patient Instructions (Signed)

## 2016-09-22 NOTE — Progress Notes (Signed)
Subjective:  Marisa Page is a [age] y.o. G1P0000 at 1461w5d being seen today for ongoing prenatal care.  She is currently monitored for the following issues for this low-risk pregnancy and has Supervision of low-risk pregnancy; Marijuana abuse; and ASCUS of cervix with negative high risk HPV on her problem list.  Patient reports occasional contractions.  Contractions: Irritability. Vag. Bleeding: None.  Movement: Present. Denies leaking of fluid.   The following portions of the patient's history were reviewed and updated as appropriate: allergies, current medications, past family history, past medical history, past social history, past surgical history and problem list. Problem list updated.  Objective:   Vitals:   09/22/16 0959  BP: 118/70  Pulse: 70  Temp: 98 F (36.7 C)  Weight: 97.7 kg (215 lb 4.8 oz)    Fetal Status: Fetal Heart Rate (bpm): 130   Movement: Present     General:  Alert, oriented and cooperative. Patient is in no acute distress.  Skin: Skin is warm and dry. No rash noted.   Cardiovascular: Normal heart rate noted  Respiratory: Normal respiratory effort, no problems with respiration noted  Abdomen: Soft, gravid, appropriate for gestational age. Pain/Pressure: Present     Pelvic:  Cervical exam performed        Extremities: Normal range of motion.  Edema: Trace  Mental Status: Normal mood and affect. Normal behavior. Normal judgment and thought content.   Urinalysis:      Assessment and Plan:  Pregnancy: G1P0000 at 6461w5d  1. Encounter for supervision of low-risk pregnancy in third trimester Stable Labor precautions  Term labor symptoms and general obstetric precautions including but not limited to vaginal bleeding, contractions, leaking of fluid and fetal movement were reviewed in detail with the patient. Please refer to After Visit Summary for other counseling recommendations.  Return in about 1 week (around 09/29/2016) for OB visit.   Hermina StaggersErvin,  Meliton Samad L, MD

## 2016-09-22 NOTE — Progress Notes (Signed)
Pt c/o uri symptoms

## 2016-09-26 ENCOUNTER — Ambulatory Visit (INDEPENDENT_AMBULATORY_CARE_PROVIDER_SITE_OTHER): Payer: Medicaid Other | Admitting: Advanced Practice Midwife

## 2016-09-26 VITALS — BP 121/79 | HR 63 | Wt 211.4 lb

## 2016-09-26 DIAGNOSIS — R87610 Atypical squamous cells of undetermined significance on cytologic smear of cervix (ASC-US): Secondary | ICD-10-CM

## 2016-09-26 DIAGNOSIS — Z3493 Encounter for supervision of normal pregnancy, unspecified, third trimester: Secondary | ICD-10-CM

## 2016-09-26 NOTE — Progress Notes (Signed)
   PRENATAL VISIT NOTE  Subjective:  Marisa Page is a 21 y.o. G1P0000 at 5145w2d being seen today for ongoing prenatal care.  She is currently monitored for the following issues for this low-risk pregnancy and has Supervision of low-risk pregnancy; Marijuana abuse; and ASCUS of cervix with negative high risk HPV on her problem list.  Patient reports occasional contractions.  Contractions: Not present. Vag. Bleeding: None.  Movement: Present. Denies leaking of fluid.   The following portions of the patient's history were reviewed and updated as appropriate: allergies, current medications, past family history, past medical history, past social history, past surgical history and problem list. Problem list updated.  Objective:   Vitals:   09/26/16 1204  BP: 121/79  Pulse: 63  Weight: 211 lb 6.4 oz (95.9 kg)    Fetal Status: Fetal Heart Rate (bpm): 135 Fundal Height: 39 cm Movement: Present  Presentation: Vertex  General:  Alert, oriented and cooperative. Patient is in no acute distress.  Skin: Skin is warm and dry. No rash noted.   Cardiovascular: Normal heart rate noted  Respiratory: Normal respiratory effort, no problems with respiration noted  Abdomen: Soft, gravid, appropriate for gestational age. Pain/Pressure: Present     Pelvic:  Cervical exam performed Dilation: Closed Effacement (%): 50 Station: -3  Extremities: Normal range of motion.  Edema: None  Mental Status: Normal mood and affect. Normal behavior. Normal judgment and thought content.   Assessment and Plan:  Pregnancy: G1P0000 at 3845w2d  There are no diagnoses linked to this encounter. Term labor symptoms and general obstetric precautions including but not limited to vaginal bleeding, contractions, leaking of fluid and fetal movement were reviewed in detail with the patient. Please refer to After Visit Summary for other counseling recommendations.  Return in about 1 week (around 10/03/2016) for  ROB/NST/AFI.   Dorathy KinsmanVirginia Cheray Pardi, CNM

## 2016-09-26 NOTE — Patient Instructions (Addendum)
Abnormal Squamous Cells of Unknown Significance (ASCUS). You do not need any additional testing. Your next Pap is due in 2021.   Pap Test Why am I having this test? A pap test is sometimes called a pap smear. It is a screening test that is used to check for signs of cancer of the vagina, cervix, and uterus. The test can also identify the presence of infection or precancerous changes. Your health care provider will likely recommend you have this test done on a regular basis. This test may be done:  Every 3 years, starting at age 21.  Every 5 years, in combination with testing for the presence of human papillomavirus (HPV).  More or less often depending on other medical conditions. What kind of sample is taken? Using a small cotton swab, plastic spatula, or brush, your health care provider will collect a sample of cells from the surface of your cervix. Your cervix is the opening to your uterus, also called a womb. Secretions from the cervix and vagina may also be collected. How do I prepare for this test?  Be aware of where you are in your menstrual cycle. You may be asked to reschedule the test if you are menstruating on the day of the test.  You may need to reschedule if you have a known vaginal infection on the day of the test.  You may be asked to avoid douching or taking a bath the day before or the day of the test.  Some medicines can cause abnormal test results, such as digitalis and tetracycline. Talk with your health care provider before your test if you take one of these medicines. What do the results mean? Abnormal test results may indicate a number of health conditions. These may include:  Cancer. Although pap test results cannot be used to diagnose cancer of the cervix, vagina, or uterus, they may suggest the possibility of cancer. Further tests would be required to determine if cancer is present.  Sexually transmitted disease.  Fungal infection.  Parasite  infection.  Herpes infection.  A condition causing or contributing to infertility. It is your responsibility to obtain your test results. Ask the lab or department performing the test when and how you will get your results. Contact your health care provider to discuss any questions you have about your results. Talk with your health care provider to discuss your results, treatment options, and if necessary, the need for more tests. Talk with your health care provider if you have any questions about your results. This information is not intended to replace advice given to you by your health care provider. Make sure you discuss any questions you have with your health care provider. Document Released: 07/08/2002 Document Revised: 12/22/2015 Document Reviewed: 09/08/2013 Elsevier Interactive Patient Education  2017 [employer]

## 2016-10-02 ENCOUNTER — Encounter (HOSPITAL_COMMUNITY): Payer: Self-pay

## 2016-10-02 ENCOUNTER — Inpatient Hospital Stay (HOSPITAL_COMMUNITY)
Admission: AD | Admit: 2016-10-02 | Discharge: 2016-10-05 | DRG: 775 | Disposition: A | Discharge status: Home / Self Care | Payer: Medicaid Other | Source: Ambulatory Visit | Attending: Family Medicine | Admitting: Family Medicine

## 2016-10-02 DIAGNOSIS — Z87891 Personal history of nicotine dependence: Secondary | ICD-10-CM | POA: Diagnosis not present

## 2016-10-02 DIAGNOSIS — Z880 Allergy status to penicillin: Secondary | ICD-10-CM | POA: Diagnosis not present

## 2016-10-02 DIAGNOSIS — Z3A40 40 weeks gestation of pregnancy: Secondary | ICD-10-CM | POA: Diagnosis not present

## 2016-10-02 DIAGNOSIS — Z3493 Encounter for supervision of normal pregnancy, unspecified, third trimester: Secondary | ICD-10-CM | POA: Diagnosis present

## 2016-10-02 LAB — TYPE AND SCREEN
ABO/RH(D): A POS
Antibody Screen: NEGATIVE

## 2016-10-02 LAB — URINALYSIS, ROUTINE W REFLEX MICROSCOPIC
Bilirubin Urine: NEGATIVE
Glucose, UA: NEGATIVE mg/dL
Hgb urine dipstick: NEGATIVE
Ketones, ur: NEGATIVE mg/dL
Leukocytes, UA: NEGATIVE
Nitrite: NEGATIVE
Protein, ur: NEGATIVE mg/dL
Specific Gravity, Urine: 1.01 (ref 1.005–1.030)
pH: 7 (ref 5.0–8.0)

## 2016-10-02 LAB — CBC
HCT: 33.4 % — ABNORMAL LOW (ref 36.0–46.0)
Hemoglobin: 11.7 g/dL — ABNORMAL LOW (ref 12.0–15.0)
MCH: 31.8 pg (ref 26.0–34.0)
MCHC: 35 g/dL (ref 30.0–36.0)
MCV: 90.8 fL (ref 78.0–100.0)
Platelets: 217 10*3/uL (ref 150–400)
RBC: 3.68 MIL/uL — ABNORMAL LOW (ref 3.87–5.11)
RDW: 15.8 % — ABNORMAL HIGH (ref 11.5–15.5)
WBC: 11.4 10*3/uL — ABNORMAL HIGH (ref 4.0–10.5)

## 2016-10-02 LAB — ABO/RH: ABO/RH(D): A POS

## 2016-10-02 MED ORDER — OXYCODONE-ACETAMINOPHEN 5-325 MG PO TABS: 1.0000 | ORAL_TABLET | ORAL | Status: DC | PRN

## 2016-10-02 MED ORDER — FENTANYL CITRATE (PF) 100 MCG/2ML IJ SOLN
100.0000 ug | INTRAMUSCULAR | Status: DC | PRN
  Administered 2016-10-03: 100 ug via INTRAVENOUS
  Filled 2016-10-02: qty 2

## 2016-10-02 MED ORDER — OXYCODONE-ACETAMINOPHEN 5-325 MG PO TABS: 2.0000 | ORAL_TABLET | ORAL | Status: DC | PRN

## 2016-10-02 MED ORDER — LACTATED RINGERS IV SOLN
500.0000 mL | INTRAVENOUS | Status: DC | PRN
  Administered 2016-10-03 (×2): 1000 mL via INTRAVENOUS

## 2016-10-02 MED ORDER — OXYTOCIN BOLUS FROM INFUSION
500.0000 mL | Freq: Once | INTRAVENOUS | Status: AC
  Administered 2016-10-03: 500 mL via INTRAVENOUS

## 2016-10-02 MED ORDER — FLEET ENEMA 7-19 GM/118ML RE ENEM: 1.0000 | ENEMA | Freq: Every day | RECTAL | Status: DC | PRN

## 2016-10-02 MED ORDER — OXYTOCIN 40 UNITS IN LACTATED RINGERS INFUSION - SIMPLE MED
2.5000 [IU]/h | INTRAVENOUS | Status: DC
  Administered 2016-10-03: 2.5 [IU]/h via INTRAVENOUS

## 2016-10-02 MED ORDER — ONDANSETRON HCL 4 MG/2ML IJ SOLN: 4.0000 mg | Freq: Four times a day (QID) | INTRAMUSCULAR | Status: DC | PRN

## 2016-10-02 MED ORDER — SOD CITRATE-CITRIC ACID 500-334 MG/5ML PO SOLN
30.0000 mL | ORAL | Status: DC | PRN
  Filled 2016-10-02: qty 15

## 2016-10-02 MED ORDER — MISOPROSTOL 200 MCG PO TABS
50.0000 ug | ORAL_TABLET | ORAL | Status: DC | PRN
  Administered 2016-10-02 (×3): 50 ug via ORAL
  Filled 2016-10-02 (×3): qty 1

## 2016-10-02 MED ORDER — LIDOCAINE HCL (PF) 1 % IJ SOLN
30.0000 mL | INTRAMUSCULAR | Status: AC | PRN
  Administered 2016-10-03: 30 mL via SUBCUTANEOUS
  Filled 2016-10-02: qty 30

## 2016-10-02 MED ORDER — TERBUTALINE SULFATE 1 MG/ML IJ SOLN: 0.2500 mg | Freq: Once | INTRAMUSCULAR | Status: DC | PRN

## 2016-10-02 MED ORDER — LACTATED RINGERS IV SOLN
INTRAVENOUS | Status: DC
  Administered 2016-10-02 (×2): via INTRAVENOUS

## 2016-10-02 MED ORDER — ACETAMINOPHEN 325 MG PO TABS: 650.0000 mg | ORAL_TABLET | ORAL | Status: DC | PRN

## 2016-10-02 NOTE — Anesthesia Pain Management Evaluation Note (Signed)
  CRNA Pain Management Visit Note  Patient: Marisa Page, 21 y.o., female  "Hello I am a member of the anesthesia team at [facility]. We have an anesthesia team available at all times to provide care throughout the hospital, including epidural management and anesthesia for C-section. I don't know your plan for the delivery whether it a natural birth, water birth, IV sedation, nitrous supplementation, doula or epidural, but we want to meet your pain goals."   1.Was your pain managed to your expectations on prior hospitalizations?   No prior hospitalizations  2.What is your expectation for pain management during this hospitalization?     Epidural  3.How can we help you reach that goal? Epidural when ready. Patient is aware of all pain control options.  Record the patient's initial score and the patient's pain goal.   Pain: 0  Pain Goal: 5 [facility] wants you to be able to say your pain was always managed very well.  Newell Wafer L 10/02/2016

## 2016-10-02 NOTE — MAU Note (Signed)
Pt presents to MAU with complaints of leakage of fluid since 730 this morning. Denies any vaginal bleeding or contractions.

## 2016-10-02 NOTE — Progress Notes (Signed)
S: Patient seen & examined for progress of labor. Pain currently controlled. No current complaints.   O:  Vitals:   10/02/16 1322 10/02/16 1323 10/02/16 1703 10/02/16 1705  BP:  120/73  120/65  Pulse:  78  66  Resp: 18  18   Temp: 97.5 F (36.4 C)  98 F (36.7 C)   TempSrc: Oral  Oral   Weight:      Height:        Dilation: Fingertip Effacement (%): 50 Station: -2 Presentation: Vertex Exam by:: CGoodnight,RN    FHT: 120 bpm, mod var, +accels, no decels TOCO: irregular/q6161m   A/P: Labor: Progressing on cytotec, first dose @1416  Pain: Well-controlled with IV meds FWB: Category I Tracing  Continue expectant management Anticipate SVD

## 2016-10-02 NOTE — H&P (Signed)
LABOR AND DELIVERY ADMISSION HISTORY AND PHYSICAL NOTE  Marisa Page is a 21 y.o. female G1P0000 with IUP at 5518w1d by LMP presenting for SROM with clear fluids at 0730 this AM. She reports +FM, + contractions, No LOF, no VB, no blurry vision, headaches or peripheral edema, and RUQ pain.  She plans on breast feeding.   Prenatal History/Complications:  Past Medical History: Past Medical History:  Diagnosis Date  . Medical history non-contributory     Past Surgical History: Past Surgical History:  Procedure Laterality Date  . NO PAST SURGERIES    . WISDOM TOOTH EXTRACTION      Obstetrical History: OB History    Gravida Para Term Preterm AB Living   1 0 0 0 0 0   SAB TAB Ectopic Multiple Live Births   0 0 0 0 0      Social History: Social History   Social History  . Marital status: Single    Spouse name: N/A  . Number of children: N/A  . Years of education: N/A   Social History Main Topics  . Smoking status: Former Smoker    Packs/day: 0.00    Types: Cigarettes, Cigars  . Smokeless tobacco: Never Used  . Alcohol use No     Comment: before pregnancy  . Drug use: No  . Sexual activity: Yes    Birth control/ protection: None   Other Topics Concern  . None   Social History Narrative  . None    Family History: Family History  Problem Relation Age of Onset  . Thyroid disease Mother   . Diabetes Father   . Stroke Father   . Thyroid disease Maternal Aunt   . Cancer Maternal Grandmother     Allergies: Allergies  Allergen Reactions  . Penicillins Hives    Has patient had a PCN reaction causing immediate rash, facial/tongue/throat swelling, SOB or lightheadedness with hypotension: Unknown Has patient had a PCN reaction causing severe rash involving mucus membranes or skin necrosis: Unknown Has patient had a PCN reaction that required hospitalization: No Has patient had a PCN reaction occurring within the last 10 years: No If all of the above  answers are "NO", then may proceed with Cephalosporin use.     Prescriptions Prior to Admission  Medication Sig Dispense Refill Last Dose  . Prenatal Vit-Fe Fumarate-FA (PRENATAL VITAMIN PO) Take 2 tablets by mouth daily. Gummies   Past Week at Unknown time     Review of Systems   All systems reviewed and negative except as stated in HPI  Physical Exam Blood pressure 126/67, pulse 63, temperature 97.7 F (36.5 C), resp. rate 18, height 5\' 5"  (1.651 m), weight 98.4 kg (217 lb), last menstrual period 12/26/2015. General appearance: alert, cooperative and appears stated age Lungs: clear to auscultation bilaterally Heart: regular rate and rhythm Abdomen: soft, non-tender; bowel sounds normal Extremities: No calf swelling or tenderness  Fetal monitoring: 120bmp, mod var, accels no decels Uterine activity: occasional contractions Dilation: Fingertip Effacement (%): 50 Station: -1 Exam by:: Morrison Oldee Carter RN   Prenatal labs: ABO, Rh: A/POS/-- (11/30 0911) Antibody: NEG (11/30 0911) Rubella: Immune RPR: Non Reactive (03/20 0811)  HBsAg: NEGATIVE (11/30 0911)  HIV: Non Reactive (03/20 0811)  GBS: Negative (05/08 0000)  1 hr Glucola: WNL Genetic screening:  Quad negative Anatomy US: normal female  Prenatal Transfer Tool  Maternal Diabetes: No Genetic Screening: Normal Maternal Ultrasounds/Referrals: Normal Fetal Ultrasounds or other Referrals:  None Maternal Substance Abuse:  No  Significant Maternal Medications:  None Significant Maternal Lab Results: Lab values include: Group B Strep negative  Results for orders placed or performed during the hospital encounter of 10/02/16 (from the past 24 hour(s))  Urinalysis, Routine w reflex microscopic   Collection Time: 10/02/16  8:52 AM  Result Value Ref Range   Color, Urine YELLOW YELLOW   APPearance HAZY (A) CLEAR   Specific Gravity, Urine 1.010 1.005 - 1.030   pH 7.0 5.0 - 8.0   Glucose, UA NEGATIVE NEGATIVE mg/dL   Hgb urine  dipstick NEGATIVE NEGATIVE   Bilirubin Urine NEGATIVE NEGATIVE   Ketones, ur NEGATIVE NEGATIVE mg/dL   Protein, ur NEGATIVE NEGATIVE mg/dL   Nitrite NEGATIVE NEGATIVE   Leukocytes, UA NEGATIVE NEGATIVE    Patient Active Problem List   Diagnosis Date Noted  . ASCUS of cervix with negative high risk HPV 09/18/2016  . Marijuana abuse 04/26/2016  . Supervision of low-risk pregnancy 03/30/2016    Assessment: Marisa Page is a 21 y.o. G1P0000 at [redacted]w[redacted]d here for SROM with clear fluids at 0730 this AM.  #Labor: SROM, will augment with oral cytotec #Pain: hoping for natural but considering epidural #FWB: Category I #ID:  GBS neg #MOF: breast #Circ:  undecided  Howard Pouch, MD PGY-1 [facility]  10/02/2016, 11:06 AM  OB FELLOW HISTORY AND PHYSICAL ATTESTATION  I confirm that I have verified the information documented in the resident's note and that I have also personally reperformed the physical exam and all medical decision making activities.      [name] 10/02/2016, 2:41 PM

## 2016-10-03 ENCOUNTER — Inpatient Hospital Stay (HOSPITAL_COMMUNITY): Payer: Medicaid Other | Admitting: Anesthesiology

## 2016-10-03 ENCOUNTER — Encounter: Payer: Medicaid Other | Admitting: Advanced Practice Midwife

## 2016-10-03 ENCOUNTER — Other Ambulatory Visit: Payer: Medicaid Other | Admitting: Medical

## 2016-10-03 DIAGNOSIS — Z3A40 40 weeks gestation of pregnancy: Secondary | ICD-10-CM

## 2016-10-03 LAB — RPR: RPR Ser Ql: NONREACTIVE

## 2016-10-03 MED ORDER — PHENYLEPHRINE 40 MCG/ML (10ML) SYRINGE FOR IV PUSH (FOR BLOOD PRESSURE SUPPORT)
80.0000 ug | PREFILLED_SYRINGE | INTRAVENOUS | Status: DC | PRN
  Filled 2016-10-03: qty 5

## 2016-10-03 MED ORDER — SENNOSIDES-DOCUSATE SODIUM 8.6-50 MG PO TABS
2.0000 | ORAL_TABLET | ORAL | Status: DC
  Administered 2016-10-03 – 2016-10-04 (×2): 2 via ORAL
  Filled 2016-10-03 (×2): qty 2

## 2016-10-03 MED ORDER — ZOLPIDEM TARTRATE 5 MG PO TABS: 5.0000 mg | ORAL_TABLET | Freq: Every evening | ORAL | Status: DC | PRN

## 2016-10-03 MED ORDER — BUTORPHANOL TARTRATE 1 MG/ML IJ SOLN
INTRAMUSCULAR | Status: AC
  Administered 2016-10-03: 2 mg
  Filled 2016-10-03: qty 2

## 2016-10-03 MED ORDER — ACETAMINOPHEN 325 MG PO TABS
650.0000 mg | ORAL_TABLET | ORAL | Status: DC | PRN
  Administered 2016-10-03 – 2016-10-04 (×2): 650 mg via ORAL
  Filled 2016-10-03 (×2): qty 2

## 2016-10-03 MED ORDER — DIBUCAINE 1 % RE OINT: 1.0000 "application " | TOPICAL_OINTMENT | RECTAL | Status: DC | PRN

## 2016-10-03 MED ORDER — FENTANYL 2.5 MCG/ML BUPIVACAINE 1/10 % EPIDURAL INFUSION (WH - ANES)
14.0000 mL/h | INTRAMUSCULAR | Status: DC | PRN
  Administered 2016-10-03: 14 mL/h via EPIDURAL

## 2016-10-03 MED ORDER — PRENATAL MULTIVITAMIN CH
1.0000 | ORAL_TABLET | Freq: Every day | ORAL | Status: DC
  Administered 2016-10-04 – 2016-10-05 (×2): 1 via ORAL
  Filled 2016-10-03 (×2): qty 1

## 2016-10-03 MED ORDER — LIDOCAINE HCL (PF) 1 % IJ SOLN
INTRAMUSCULAR | Status: DC | PRN
  Administered 2016-10-03 (×2): 5 mL via EPIDURAL

## 2016-10-03 MED ORDER — TETANUS-DIPHTH-ACELL PERTUSSIS 5-2.5-18.5 LF-MCG/0.5 IM SUSP: 0.5000 mL | Freq: Once | INTRAMUSCULAR | Status: DC

## 2016-10-03 MED ORDER — WITCH HAZEL-GLYCERIN EX PADS: 1.0000 "application " | MEDICATED_PAD | CUTANEOUS | Status: DC | PRN

## 2016-10-03 MED ORDER — LACTATED RINGERS IV SOLN: 500.0000 mL | Freq: Once | INTRAVENOUS | Status: DC

## 2016-10-03 MED ORDER — ONDANSETRON HCL 4 MG/2ML IJ SOLN
4.0000 mg | INTRAMUSCULAR | Status: DC | PRN
Start: 2016-10-03 — End: 2016-10-05

## 2016-10-03 MED ORDER — COCONUT OIL OIL
1.0000 | TOPICAL_OIL | Status: DC | PRN
Start: 2016-10-03 — End: 2016-10-05

## 2016-10-03 MED ORDER — IBUPROFEN 600 MG PO TABS
600.0000 mg | ORAL_TABLET | Freq: Four times a day (QID) | ORAL | Status: DC
  Administered 2016-10-03 – 2016-10-05 (×8): 600 mg via ORAL
  Filled 2016-10-03 (×9): qty 1

## 2016-10-03 MED ORDER — PHENYLEPHRINE 40 MCG/ML (10ML) SYRINGE FOR IV PUSH (FOR BLOOD PRESSURE SUPPORT)
PREFILLED_SYRINGE | INTRAVENOUS | Status: AC
  Filled 2016-10-03: qty 10

## 2016-10-03 MED ORDER — FENTANYL 2.5 MCG/ML BUPIVACAINE 1/10 % EPIDURAL INFUSION (WH - ANES)
INTRAMUSCULAR | Status: AC
  Filled 2016-10-03: qty 100

## 2016-10-03 MED ORDER — DIPHENHYDRAMINE HCL 25 MG PO CAPS: 25.0000 mg | ORAL_CAPSULE | Freq: Four times a day (QID) | ORAL | Status: DC | PRN

## 2016-10-03 MED ORDER — DIPHENHYDRAMINE HCL 50 MG/ML IJ SOLN: 12.5000 mg | INTRAMUSCULAR | Status: DC | PRN

## 2016-10-03 MED ORDER — LACTATED RINGERS IV SOLN
INTRAVENOUS | Status: DC
  Administered 2016-10-03: 250 mL via INTRAUTERINE

## 2016-10-03 MED ORDER — EPHEDRINE 5 MG/ML INJ
10.0000 mg | INTRAVENOUS | Status: DC | PRN
  Filled 2016-10-03: qty 2

## 2016-10-03 MED ORDER — BENZOCAINE-MENTHOL 20-0.5 % EX AERO
1.0000 "application " | INHALATION_SPRAY | CUTANEOUS | Status: DC | PRN
  Administered 2016-10-03: 1 via TOPICAL
  Filled 2016-10-03: qty 56

## 2016-10-03 MED ORDER — SIMETHICONE 80 MG PO CHEW: 80.0000 mg | CHEWABLE_TABLET | ORAL | Status: DC | PRN

## 2016-10-03 MED ORDER — TERBUTALINE SULFATE 1 MG/ML IJ SOLN
0.2500 mg | Freq: Once | INTRAMUSCULAR | Status: AC | PRN
  Administered 2016-10-03: 0.25 mg via SUBCUTANEOUS
  Filled 2016-10-03: qty 1

## 2016-10-03 MED ORDER — BUTORPHANOL TARTRATE 1 MG/ML IJ SOLN
2.0000 mg | Freq: Once | INTRAMUSCULAR | Status: AC
  Administered 2016-10-03: 2 mg via INTRAVENOUS
  Filled 2016-10-03: qty 2

## 2016-10-03 MED ORDER — OXYTOCIN 40 UNITS IN LACTATED RINGERS INFUSION - SIMPLE MED
1.0000 m[IU]/min | INTRAVENOUS | Status: DC
  Administered 2016-10-03: 2 m[IU]/min via INTRAVENOUS
  Filled 2016-10-03: qty 1000

## 2016-10-03 MED ORDER — ONDANSETRON HCL 4 MG PO TABS: 4.0000 mg | ORAL_TABLET | ORAL | Status: DC | PRN

## 2016-10-03 NOTE — Anesthesia Procedure Notes (Signed)
Epidural Patient location during procedure: OB Start time: 10/03/2016 7:50 AM End time: 10/03/2016 8:03 AM  Staffing Anesthesiologist: Heather RobertsSINGER, Lynel Forester Performed: anesthesiologist   Preanesthetic Checklist Completed: patient identified, site marked, pre-op evaluation, timeout performed, IV checked, risks and benefits discussed and monitors and equipment checked  Epidural Patient position: sitting Prep: DuraPrep Patient monitoring: heart rate, cardiac monitor, continuous pulse ox and blood pressure Approach: midline Location: L2-L3 Injection technique: LOR saline  Needle:  Needle type: Tuohy  Needle gauge: 17 G Needle length: 9 cm Needle insertion depth: 8 cm Catheter size: 20 Guage Catheter at skin depth: 12 cm Test dose: negative and Other  Assessment Events: blood not aspirated, injection not painful, no injection resistance and negative IV test  Additional Notes Informed consent obtained prior to proceeding including risk of failure, 1% risk of PDPH, risk of minor discomfort and bruising.  Discussed rare but serious complications including epidural abscess, permanent nerve injury, epidural hematoma.  Discussed alternatives to epidural analgesia and patient desires to proceed.  Timeout performed pre-procedure verifying patient name, procedure, and platelet count.  Patient tolerated procedure well.

## 2016-10-03 NOTE — Anesthesia Preprocedure Evaluation (Signed)
Anesthesia Evaluation  Patient identified by MRN, date of birth, ID band Patient awake    Reviewed: Allergy & Precautions, NPO status , Patient's Chart, lab work & pertinent test results  History of Anesthesia Complications Negative for: history of anesthetic complications  Airway Mallampati: II  TM Distance: >3 FB Neck ROM: Full    Dental no notable dental hx. (+) Dental Advisory Given   Pulmonary former smoker,    Pulmonary exam normal        Cardiovascular negative cardio ROS Normal cardiovascular exam     Neuro/Psych negative neurological ROS  negative psych ROS   GI/Hepatic negative GI ROS, Neg liver ROS,   Endo/Other  negative endocrine ROS  Renal/GU negative Renal ROS  negative genitourinary   Musculoskeletal negative musculoskeletal ROS (+)   Abdominal   Peds negative pediatric ROS (+)  Hematology negative hematology ROS (+)   Anesthesia Other Findings   Reproductive/Obstetrics (+) Pregnancy                             Anesthesia Physical Anesthesia Plan  ASA: II  Anesthesia Plan: Epidural   Post-op Pain Management:    Induction:   PONV Risk Score and Plan:   Airway Management Planned: Natural Airway  Additional Equipment:   Intra-op Plan:   Post-operative Plan:   Informed Consent:   Plan Discussed with:   Anesthesia Plan Comments:         Anesthesia Quick Evaluation

## 2016-10-03 NOTE — Lactation Note (Signed)
This note was copied from a baby's chart. Lactation Consultation Note P1 mom with infant being assessed by pediatrician in L and D.  LC reviewed mom with bf basics.  Hand expression reviewed and taught.  Mom was unable to express drops at this time but a glistening was noticed  After LC massaged and used breast compression.  LC encouraged mom to feed 8-12 times in 24 hours and to feed on demand with cues.  Waking techniques and feeds cues reviewed with mom.  Mom states she understands.  She states her nipples are very tender from him nursing.  Mom has soft compressible breast tissue with everted nipples.  Mom denies having any questions at this time.  Mom being transferred to Mother baby unit at this time.  LC will meet mom over on the mother baby unit.  Mom has good support from her sister.  Her sister BF her 4 kids and said her DEBP would be passed on to her sister.    Patient Name: Marisa Dellis Filbertoadiah McGibboney-Wyatt WUJWJ'XToday's Date: 10/03/2016 Reason for consult: Initial assessment   Maternal Data Formula Feeding for Exclusion: No Has patient been taught Hand Expression?: Yes (small amt. glistening seen but unable to hand express any drops) Does the patient have breastfeeding experience prior to this delivery?: No  Feeding Feeding Type: Breast Milk Length of feed: 15 min  LATCH Score/Interventions                      Lactation Tools Discussed/Used [facility] [facility]   Consult Status Consult Status: Follow-up Date: 10/04/16 Follow-up type: In-patient    Maryruth HancockKelly Suzanne [facility] 10/03/2016, 5:46 PM

## 2016-10-03 NOTE — Progress Notes (Addendum)
Patient noted to have Category III tracing with prolonged decels with every contraction.  Resolved to baseline between decels with accels and good variability inbetween.   IUPC placed with amnioinfusion @0900 .    Within a few contractions of placing the amnioinfusion decels resolved, now category I tracing.   Dilation: 4 Effacement (%): 90 Cervical Position: Middle Station: 0 Presentation: Vertex Exam by:: Ferne CoeS Earl RNC   FHT: 120 bpm, mod var, +accels, no decels TOCO: q526min   A/P: Labor: Monitor tracing, plan to start pitocin and increase as tolerated Pain: well controlled with epidural FWB: now category I tracing, continue to monitor  Continue expectant management Anticipate SVD   Howard PouchLauren Nickol Collister, MD PGY-1 [facility]

## 2016-10-03 NOTE — Progress Notes (Signed)
Will get pt epidural then will place internals.

## 2016-10-03 NOTE — Anesthesia Postprocedure Evaluation (Signed)
Anesthesia Post Note  Patient: Marisa Page  Procedure(s) Performed: * No procedures listed *     Patient location during evaluation: Mother Baby Anesthesia Type: Epidural Level of consciousness: awake and alert and oriented Pain management: satisfactory to patient Vital Signs Assessment: post-procedure vital signs reviewed and stable Respiratory status: spontaneous breathing and nonlabored ventilation Cardiovascular status: stable Postop Assessment: no headache, no backache, no signs of nausea or vomiting, adequate PO intake and patient able to bend at knees (patient up walking) Anesthetic complications: no    Last Vitals:  Vitals:   10/03/16 1730 10/03/16 1929  BP: 115/64 118/62  Pulse: 96 90  Resp: 16 18  Temp: 36.9 C 37.1 C    Last Pain:  Vitals:   10/03/16 1929  TempSrc: Oral  PainSc:    Pain Goal:                 Madison HickmanGREGORY,Rhia Blatchford

## 2016-10-03 NOTE — Progress Notes (Signed)
Patient seen and doing well. She is uncomfortable with contractions and asking for more pain medications. Cervix is 2/90/-2.   FHTs: 120 mod/var/reactive  A/P will give dose of IV pain medications start pitocin when able.  Ernestina PennaNicholas Ugonna Keirsey, MD 10/03/16 6:22 AM

## 2016-10-03 NOTE — Lactation Note (Signed)
This note was copied from a baby's chart. Lactation Consultation Note P1 mom resting in bed.  LC followed up with mom from labor and delivery.  Baby STS with dad.  Mom states she wants to rest at this time and not BF infant.  LC reviewed support groups available and BF resource sheet as well as OP services offered.  LC encouraged mom and family to call out for assistance with feed, or if other questions or concerns arise.    Patient Name: Boy Dellis Filbertoadiah McGibboney-Wyatt [id number]'XToday's Date: 10/03/2016 Reason for consult: Initial assessment   Maternal Data Formula Feeding for Exclusion: No Has patient been taught Hand Expression?: Yes (small amt. glistening seen but unable to hand express any drops) Does the patient have breastfeeding experience prior to this delivery?: No  Feeding Feeding Type: Breast Milk Length of feed: 15 min  LATCH Score/Interventions                      Lactation Tools Discussed/Used [facility]IC Program: [facility] [facility]   Consult Status Consult Status: Follow-up Date: 10/04/16 Follow-up type: In-patient    Maryruth HancockKelly Suzanne [facility] 10/03/2016, 5:53 PM

## 2016-10-04 MED ORDER — IBUPROFEN 600 MG PO TABS: 600.0000 mg | ORAL_TABLET | Freq: Four times a day (QID) | ORAL | 1 refills | Status: DC | PRN

## 2016-10-04 NOTE — Lactation Note (Signed)
This note was copied from a baby's chart. Lactation Consultation Note  [name] ZOXWR'UToday's Date: 10/04/2016 Reason for consult: Follow-up assessment Mom states baby just finished a 30 minute feeding.  She states it took a little time to latch baby but once latched he did well.  Instructed to feed with any feeding cue and call for assist prn.  Maternal Data    Feeding Feeding Type: Breast Fed Length of feed: 30 min  LATCH Score/Interventions Latch: Too sleepy or reluctant, no latch achieved, no sucking elicited. (attempted in cross cradle and football hold; infant sleepy) Intervention(s): Skin to skin  Audible Swallowing: None Intervention(s): Hand expression;Skin to skin                 Lactation Tools Discussed/Used     Consult Status Consult Status: Follow-up Date: 10/05/16 Follow-up type: In-patient    Huston FoleyMOULDEN, Lesley Galentine S 10/04/2016, 10:12 AM

## 2016-10-04 NOTE — Progress Notes (Signed)
Post Partum Day #1 Subjective: no complaints, up ad lib, voiding, tolerating PO and reports normal lochia  Objective: Blood pressure 111/62, pulse 82, temperature 98.6 F (37 C), temperature source Oral, resp. rate 18, height 5\' 5"  (1.651 m), weight 98.4 kg (217 lb), last menstrual period 12/26/2015, SpO2 100 %.  Physical Exam:  General: alert Lochia: appropriate Uterine Fundus: firm and NT at U-1 DVT Evaluation: No evidence of DVT seen on physical exam.   Recent Labs  10/02/16 1351  HGB 11.7*  HCT 33.4*    Assessment/Plan: Plan for discharge tomorrow unless baby is allowed to go home today. In that case, she would like to go home. Declines contraception Breast feeding   LOS: 2 days   Marisa Page 10/04/2016, 6:24 AM

## 2016-10-04 NOTE — Discharge Instructions (Signed)

## 2016-10-05 ENCOUNTER — Encounter (HOSPITAL_COMMUNITY): Payer: Self-pay | Admitting: *Deleted

## 2016-10-05 NOTE — Clinical Social Work Maternal (Signed)
CLINICAL SOCIAL WORK MATERNAL/CHILD NOTE  Patient Details  Name: Marisa Page MRN: [id number] Date of Birth: [birth year]  Date:  10/05/2016  Clinical Social Worker Initiating Note:  Laurey Arrow Date/ Time Initiated:  10/05/16/1130     Child's Name:  Marisa Page   Legal Guardian:  Mother (FOB is Dhruba Page 03/01/1996n)   Need for Interpreter:  None   Date of Referral:  10/05/16     Reason for Referral:  Current Substance Use/Substance Use During Pregnancy  (hx of THC use. )   Referral Source:  [facility]   Address:  [address] [place] [address]  Phone number:  [phone]   Household Members:  Self, Significant Other   Natural Supports (not living in the home):  Immediate Family, Friends (FOB's family will also be a source of support. )   Professional Supports: None   Employment: Unemployed   Type of Work:     Education:  Database administrator Resources:      Other Resources:  [facility], Food Stamps    Cultural/Religious Considerations Which May Impact Care:  Per MOB's Face Sheet, MOB is [place].   Strengths:  Ability to meet basic needs , Pediatrician chosen , Home prepared for child    Risk Factors/Current Problems:  Substance Use , Mental Health Concerns    Cognitive State:  Alert , Able to Concentrate , Linear Thinking , Insightful    Mood/Affect:  Tearful , Flat , Relaxed , Interested , Comfortable    CSW Assessment: CSW met with MOB to complete an assessment for hx of THC.  When CSW arrived, MOB and FOB were resting in bed.  FOB was holding infant engaging in skin to skin, and MOB was tearful.  CSW introduced herself and explained CSW's role. MOB gave CSW permission to complete the clinical assessment while FOB was present. FOB was supportive of MOB while MOB was crying as evident by massaging MOB's back and expressing that everything will be alright. CSW inquired about MOB's emotions and  MOB reported "I'm just having a mommy moment".  MOB wiped her tears and was not interested in sharing her thoughts and feelings. CSW educated MOB about PPD. CSW informed MOB of possible supports and interventions to decrease PPD.  CSW also encouraged MOB to seek medical attention if needed for increased signs and symptoms for PPD. MOB denied any MH hx and declined outpatient resources offered by CSW.   CSW inquired about MOB's supports and MOB and FOB reported a wealth of supports from MOB's and FOB's immediate and extended family.  MOB communicated that MOB and family plans to stay with FOB's mother for a few weeks in effort to receive hands-on support and assistance with infant.  CSW educated the parent's about SIDS and encouraged them to ask questions.  Both parent's was knowledgeable and responded appropriately to CSW's questions.    CSW inquired about MOB's SA hx and MOB admitted to the use of marijuana throughout pregnancy.  MOB also admitted to taking a methadone pill that was not prescribed to MOB early during pregnancy.  MOB reported that MOB asked MOB's mother for medication for pain and not knowing, MOB was given a methadone pill.  MOB denied the use of any other illicit substance. CSW reviewed the hospital's SA policy and encouraged the parent's to ask questions.  MOB and FOB was made aware that infant's UDS was positive for [facility] and CSW will be making a report to  [facility] CPS.  MOB declined resources for SA and appeared understanding of SA policy.   MOB reports having all necessary items for infant and reported being prepared to parent.  CSW thanked the family for meeting with  CSW and provided them with CSW's contact information.    CSW made a CPS report to [place] worker, Wendall Stade.  CPS will follow-up with family after d/c.  There are no barriers to d/c.  CSW Plan/Description:  Child Protective Service Report , No Further Intervention Required/No Barriers to Discharge,  Patient/Family Education , Information/Referral to [place], MSW, [employer] Social Work [phone]   Dimple Nanas, LCSW 10/05/2016, 12:02 PM

## 2016-10-05 NOTE — Lactation Note (Signed)
This note was copied from a baby's chart. Lactation Consultation Note  [name] TCNGF'R Date: 10/05/2016 Reason for consult: Follow-up assessment  Baby is 65 hour old  LC reviewed doc flow sheets , updated per mom. Baby hungry at consult , orientee assisted mom to  Latch in the cross cradle , dimpling noted in the cheek.  When position changed to football , dimpling disappeared And increased swallows, increased with breast compressions.  Sore nipple and engorgement prevention and tx.  Per mom nipples tender , no breakdown noted.  Mom has a Hand pump and a DEBP Medela kit for D/C.  LC mentioned to mom since she is going for a weight check tomorrow  If the weight is stable , just use the hand pump, if it is more than 7% weight.  Call [facility] for DEBP .  Instructed mom on the use shells.  Mother informed of post-discharge support and given phone number to the lactation department, including services for phone call assistance; out-patient appointments; and breastfeeding support group. List of other breastfeeding resources in the community given in the handout. Encouraged mother to call for problems or concerns related to breastfeeding.   Maternal Data    Feeding Feeding Type: Breast Fed Length of feed: 10 min  LATCH Score/Interventions Latch: Grasps breast easily, tongue down, lips flanged, rhythmical sucking. (depth achieved) Intervention(s): Skin to skin Intervention(s): Adjust position  Audible Swallowing: Spontaneous and intermittent (cheek dimpling ceased) Intervention(s): Skin to skin;Hand expression  Type of Nipple: Everted at rest and after stimulation  Comfort (Breast/Nipple): Soft / non-tender  Problem noted: Mild/Moderate discomfort Interventions (Mild/moderate discomfort): Hand massage;Hand expression;Reverse pressue;Breast shields  Hold (Positioning): Assistance needed to correctly position infant at breast and maintain  latch. Intervention(s): Breastfeeding basics reviewed;Support Pillows;Position options;Skin to skin  LATCH Score: 9  Lactation Tools Discussed/Used     Consult Status      Jerlyn Ly Modine Oppenheimer 10/05/2016, 12:54 PM

## 2016-10-05 NOTE — Discharge Summary (Signed)
OB Discharge Summary  [name] DOB: 03-30-1996 MRN: [id number]  Date of admission: 10/02/2016 Delivering MD: Howard PouchFENG, LAUREN   Date of discharge: 10/05/2016  Admitting diagnosis: 40WKS, MUCUS PLUG MAY HAVE BROKEN Intrauterine pregnancy: 4112w4d     Secondary diagnosis:Active Problems:   Normal labor      Discharge diagnosis: Term Pregnancy Delivered         Augmentation: Pitocin and Cytotec  Complications: None  Hospital course:  Onset of Labor With Vaginal Delivery     21 y.o. yo G1P0000 at 5512w4d was admitted in Latent Labor on 10/02/2016. Patient had an uncomplicated labor course as follows:  Membrane Rupture Time/Date: 7:30 AM ,10/02/2016   Intrapartum Procedures: Episiotomy: None [1]                                         Lacerations:  Sulcus [9];1st degree [2];Vaginal [6];Labial [10]  Patient had a delivery of a Viable infant. 10/03/2016  Information for the patient's newborn:  Luiz IronMcGibboney-Wyatt, Boy Eldana [[id number]][[id number]]  Delivery Method: Vag-Spont    Pateint had an uncomplicated postpartum course.  She is ambulating, tolerating a regular diet, passing flatus, and urinating well. Patient is discharged home in stable condition on 10/05/16.   Physical exam  Vitals:   10/03/16 2130 10/04/16 0615 10/04/16 1944 10/05/16 0540  BP: 116/66 111/62 117/61 120/65  Pulse: 95 82 76 67  Resp: 18 18 18 17   Temp: 98.6 F (37 C) 98.6 F (37 C) 98.3 F (36.8 C) 98.3 F (36.8 C)  TempSrc: Oral Oral Oral Oral  SpO2:      Weight:      Height:       General: alert Lochia: appropriate Uterine Fundus: firm Incision: N/A DVT Evaluation: No evidence of DVT seen on physical exam. Labs: Lab Results  Component Value Date   WBC 11.4 (H) 10/02/2016   HGB 11.7 (L) 10/02/2016   HCT 33.4 (L) 10/02/2016   MCV 90.8 10/02/2016   PLT 217 10/02/2016   No flowsheet data found.  Discharge instruction: per After Visit Summary and "Baby and Me Booklet".  After  Visit Meds:  Allergies as of 10/05/2016      Reactions   Penicillins Hives   Has patient had a PCN reaction causing immediate rash, facial/tongue/throat swelling, SOB or lightheadedness with hypotension: Unknown Has patient had a PCN reaction causing severe rash involving mucus membranes or skin necrosis: Unknown Has patient had a PCN reaction that required hospitalization: No Has patient had a PCN reaction occurring within the last 10 years: No If all of the above answers are "NO", then may proceed with Cephalosporin use.      Medication List    TAKE these medications   ibuprofen 600 MG tablet Commonly known as:  ADVIL,MOTRIN Take 1 tablet (600 mg total) by mouth every 6 (six) hours as needed.   PRENATAL VITAMIN PO Take 2 tablets by mouth daily. Gummies       Diet: routine diet  Activity: Advance as tolerated. Pelvic rest for 6 weeks.   Outpatient follow up:4 weeks Follow up Appt:No future appointments. Follow up visit: No Follow-up on file.  Postpartum contraception: Condoms  Newborn Data: Live born female  Birth Weight: 6 lb 1.7 oz (2770 g) APGAR: 7, 9  Baby Feeding: Breast Disposition:home with mother   10/05/2016 Allie BossierMyra C Estela Vinal, MD

## 2016-11-08 ENCOUNTER — Ambulatory Visit (INDEPENDENT_AMBULATORY_CARE_PROVIDER_SITE_OTHER): Payer: Medicaid Other | Admitting: Advanced Practice Midwife

## 2016-11-08 ENCOUNTER — Encounter: Payer: Self-pay | Admitting: Advanced Practice Midwife

## 2016-11-08 DIAGNOSIS — Z3009 Encounter for other general counseling and advice on contraception: Secondary | ICD-10-CM

## 2016-11-08 NOTE — Progress Notes (Signed)
Subjective:     Marisa Page is a 21 y.o. female who presents for a postpartum visit. She is 5 weeks postpartum following a spontaneous vaginal delivery. I have fully reviewed the prenatal and intrapartum course. The delivery was at 40 gestational weeks. Outcome: spontaneous vaginal delivery. Anesthesia: epidural. Postpartum course has been uncomplicated. Baby's course has been uncomplicated. Baby is feeding by both breast and bottle - Similac Advance. Bleeding staining only. Bowel function is normal. Bladder function is normal. Patient is sexually active. Contraception method is none. Postpartum depression screening: negative.  The following portions of the patient's history were reviewed and updated as appropriate: allergies, current medications, past family history, past medical history, past social history, past surgical history and problem list.  Review of Systems Pertinent items are noted in HPI.   Concerned about milk supply. Giving few bottles while pt sleeps, goes out.  Prefers to avoid hormonal BC due to side effects.   Objective:    BP 115/69   Pulse 68   Ht 5\' 8"  (1.727 m)   Wt 196 lb 1.6 oz (89 kg)   Breastfeeding? Yes Comment: both  BMI 29.82 kg/m   General:  alert, cooperative, appears stated age and no distress   Breasts:  inspection negative, no nipple discharge or bleeding, no masses or nodularity palpable  Lungs: clear to auscultation bilaterally  Heart:  regular rate and rhythm, S1, S2 normal, no murmur, click, rub or gallop  Abdomen: soft, non-tender; bowel sounds normal; no masses,  no organomegaly   Vulva:  normal  Vagina: normal vagina        Assessment:     Nml postpartum exam. Pap smear not done at today's visit.   Plan:    1. Contraception: condoms/LAM 2. Discussed Benefits of exclusive BF and that formula supplementation can decrease milk supply. Tips for pumping.  3. Follow up in: 1 year or as needed.    Katrinka BlazingSmith, [place],  CNM 11/08/2016 2:21 PM

## 2016-11-08 NOTE — Patient Instructions (Signed)
Breast Pumping Tips °If you are breastfeeding, there may be times when you cannot feed your baby directly. Returning to work or going on a trip are common examples. Pumping allows you to store breast milk and feed it to your baby later. °You may not get much milk when you first start to pump. Your breasts should start to make more after a few days. If you pump at the times you usually feed your baby, you may be able to keep making enough milk to feed your baby without also using formula. The more often you pump, the more milk you will produce. °When should I pump? °· You can begin to pump soon after delivery. However, some experts recommend waiting about 4 weeks before giving your infant a bottle to make sure breastfeeding is going well. °· If you plan to return to work, begin pumping a few weeks before. This will help you develop techniques that work best for you. It also lets you build up a supply of breast milk. °· When you are with your infant, feed on demand and pump after each feeding. °· When you are away from your infant for several hours, pump for about 15 minutes every 2-3 hours. Pump both breasts at the same time if you can. °· If your infant has a formula feeding, make sure to pump around the same time. °· If you drink any alcohol, wait 2 hours before pumping. °How do I prepare to pump? °Your let-down reflex is the natural reaction to stimulation that makes your breast milk flow. It is easier to stimulate this reflex when you are relaxed. Find relaxation techniques that work for you. If you have difficulty with your let-down reflex, try these methods: °· Smell one of your infant's blankets or an item of clothing. °· Look at a picture or video of your infant. °· Sit in a quiet, private space. °· Massage the breast you plan to pump. °· Place soothing warmth on the breast. °· Play relaxing music. ° °What are some general breast pumping tips? °· Wash your hands before you pump. You do not need to wash your  nipples or breasts. °· There are three ways to pump. °? You can use your hand to massage and compress your breast. °? You can use a handheld manual pump. °? You can use an electric pump. °· Make sure the suction cup (flange) on the breast pump is the right size. Place the flange directly over the nipple. If it is the wrong size or placed the wrong way, it may be painful and cause nipple damage. °· If pumping is uncomfortable, apply a small amount of purified or modified lanolin to your nipple and areola. °· If you are using an electric pump, adjust the speed and suction power to be more comfortable. °· If pumping is painful or if you are not getting very much milk, you may need a different type of pump. A lactation consultant can help you determine what type of pump to use. °· Keep a full water bottle near you at all times. Drinking lots of fluid helps you make more milk. °· You can store your milk to use later. Pumped breast milk can be stored in a sealable, sterile container or plastic bag. Label all stored breast milk with the date you pumped it. °? Milk can stay out at room temperature for up to 8 hours. °? You can store your milk in the refrigerator for up to 8 days. °? You can   store your milk in the freezer for 3 months. Thaw frozen milk using warm water. Do not put it in the microwave. °· Do not smoke. Smoking can lower your milk supply and harm your infant. If you need help quitting, ask your health care provider to recommend a program. °When should I call my health care provider or a lactation consultant? °· You are having trouble pumping. °· You are concerned that you are not making enough milk. °· You have nipple pain, soreness, or redness. °· You want to use birth control. Birth control pills may lower your milk supply. Talk to your health care provider about your options. °This information is not intended to replace advice given to you by your health care provider. Make sure you discuss any questions  you have with your health care provider. °Document Released: 10/05/2009 Document Revised: 09/29/2015 Document Reviewed: 02/07/2013 °Elsevier Interactive Patient Education © 2017 Elsevier Inc. ° °

## 2017-02-02 ENCOUNTER — Encounter: Payer: Self-pay | Admitting: Advanced Practice Midwife

## 2017-02-05 ENCOUNTER — Telehealth: Payer: Self-pay | Admitting: Clinical

## 2017-02-05 NOTE — Telephone Encounter (Signed)
Pt is feeling overwhelmed, and would like to come in to talk about "possible postpartum depression"; pt agrees to see [facility] on 02-06-17 at 11:00am, has arranged for transportation to the appointment, and is familiar with the location. No SI present.

## 2017-02-06 ENCOUNTER — Ambulatory Visit (INDEPENDENT_AMBULATORY_CARE_PROVIDER_SITE_OTHER): Payer: Self-pay | Admitting: Clinical

## 2017-02-06 DIAGNOSIS — F4321 Adjustment disorder with depressed mood: Secondary | ICD-10-CM

## 2017-02-06 NOTE — BH Specialist Note (Signed)
Integrated Behavioral Health Initial Visit  MRN: [id number] Name: Marisa Page  Number of Integrated Behavioral Health Clinician visits:: 1/6 Session Start time: 11:50  Session End time: 12:50 Total time: 1 hour  Type of Service: Integrated Behavioral Health- Individual/Family Interpretor:No. Interpretor Name and Language: n/a   Warm Hand Off Completed.       SUBJECTIVE: Marisa Page is a 21 y.o. female accompanied by n/a Patient was referred by Dorathy Kinsman, CNM for  depression. Patient reports the following symptoms/concerns: Pt states that her primary concern today is feeling depressed and stress postpartum; feels it would help to talk about her feelings. 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 Reduce symptoms of: depression and stress 2. Increase knowledge and/or ability of: coping skills  3. Demonstrate ability to: Increase healthy adjustment to current life circumstances and Increase adequate support systems for patient/family  INTERVENTIONS: Interventions utilized: Solution-Focused Strategies, Mindfulness or Management consultant and Psychoeducation and/or Health Education  Standardized Assessments completed: GAD-7 and PHQ 9  ASSESSMENT: Patient currently experiencing Adjustment disorder with depressed mood.   Patient may benefit from psychoeducation and brief therapeutic intervention regarding coping with symptoms of depression.  PLAN: 1. Follow up with behavioral health clinician on : As needed 2. Behavioral recommendations:  -CALM relaxation breathing  exercise every morning with morning routine; repeat at bedtime -Consider [facility] postpartum weekly support group in [facility] -Consider setting aside time to paint -Read educational material regarding coping with symptoms of depression 3. Referral(s): Integrated [facility] [facility] (In Clinic) and postpartum support group 4. "From scale of 1-10, how likely are you to follow plan?": 9  Rae Lips, LCSWA   Depression screen [facility] 2/9 02/06/2017 11/08/2016 09/22/2016 09/05/2016 08/15/2016  Decreased Interest 3 0 0 0 0  Down, Depressed, Hopeless 2 0 0 0 0  PHQ - 2 Score 5 0 0 0 0  Altered sleeping 3 0 0 0 0  Tired, decreased energy 2 0 0 0 0  Change in appetite 3 0 0 0 0  Feeling bad or failure about yourself  2 0 0 0 0  Trouble concentrating 1 0 0 0 0  Moving slowly or fidgety/restless 1 0 0 0 0  Suicidal thoughts 0 0 0 0 0  PHQ-9 Score 17 0 0 0 0   GAD 7 : Generalized Anxiety Score 02/06/2017 11/08/2016 09/22/2016 09/05/2016  Nervous, Anxious, on Edge 1 0 0 0  Control/stop worrying 2 0 0 0  Worry too much - different things 2 0 0 0  Trouble relaxing 1 0 0 0  Restless 0 0 0 0  Easily annoyed or irritable 3 0 0 0  Afraid - awful might happen 0 0 0 0  Total GAD 7 Score 9 0 0 0

## 2017-08-07 ENCOUNTER — Encounter: Payer: Self-pay | Admitting: Advanced Practice Midwife

## 2017-09-04 ENCOUNTER — Encounter: Payer: Self-pay | Admitting: *Deleted

## 2017-09-08 ENCOUNTER — Encounter: Payer: Self-pay | Admitting: Advanced Practice Midwife

## 2017-09-12 ENCOUNTER — Ambulatory Visit: Payer: Self-pay | Admitting: Advanced Practice Midwife

## 2017-09-12 ENCOUNTER — Encounter: Payer: Self-pay | Admitting: *Deleted

## 2017-09-12 NOTE — Progress Notes (Signed)
Marisa Page did not keep her scheduled appointment for follow up. Per discussion with Santiago Bur does not need to be called, may reschedule if she calls.

## 2017-10-01 ENCOUNTER — Ambulatory Visit: Payer: Medicaid Other

## 2017-10-23 ENCOUNTER — Other Ambulatory Visit: Payer: Self-pay

## 2017-10-23 ENCOUNTER — Emergency Department (HOSPITAL_COMMUNITY)
Admission: EM | Admit: 2017-10-23 | Discharge: 2017-10-24 | Disposition: A | Discharge status: Home / Self Care | Payer: Medicaid Other | Attending: Emergency Medicine | Admitting: Emergency Medicine

## 2017-10-23 ENCOUNTER — Encounter (HOSPITAL_COMMUNITY): Payer: Self-pay | Admitting: Emergency Medicine

## 2017-10-23 DIAGNOSIS — H5789 Other specified disorders of eye and adnexa: Secondary | ICD-10-CM | POA: Diagnosis present

## 2017-10-23 DIAGNOSIS — H1033 Unspecified acute conjunctivitis, bilateral: Secondary | ICD-10-CM

## 2017-10-23 DIAGNOSIS — F17210 Nicotine dependence, cigarettes, uncomplicated: Secondary | ICD-10-CM | POA: Diagnosis not present

## 2017-10-23 MED ORDER — POLYMYXIN B-TRIMETHOPRIM 10000-0.1 UNIT/ML-% OP SOLN
1.0000 [drp] | OPHTHALMIC | Status: DC
  Administered 2017-10-24: 1 [drp] via OPHTHALMIC
  Filled 2017-10-23: qty 10

## 2017-10-23 MED ORDER — TETRACAINE HCL 0.5 % OP SOLN
1.0000 [drp] | Freq: Once | OPHTHALMIC | Status: AC
  Administered 2017-10-23: 1 [drp] via OPHTHALMIC
  Filled 2017-10-23: qty 4

## 2017-10-23 MED ORDER — FLUORESCEIN SODIUM 1 MG OP STRP
1.0000 | ORAL_STRIP | Freq: Once | OPHTHALMIC | Status: AC
  Administered 2017-10-23: 1 via OPHTHALMIC
  Filled 2017-10-23: qty 1

## 2017-10-23 NOTE — ED Triage Notes (Signed)
Pt arriving with bilateral eye irritation that has progressed since Sunday. Pt states she is not sure what caused it but the issue started in the left eye and has now spread to the right. Eyes are slightly swollen and red. Blurry vision in the left eye.

## 2017-10-23 NOTE — Discharge Instructions (Signed)
Use eyedrops as directed.  Instill 1 drop in each eye every 4 hours for 5 days.

## 2017-10-23 NOTE — ED Provider Notes (Signed)
[facility]-EMERGENCY DEPT Provider Note   CSN: [id number] Arrival date & time: 10/23/17  2112     History   Chief Complaint Chief Complaint  Patient presents with  . Eye Irritation    both    HPI Marisa Page is a 22 y.o. female.  Patient presents to the emergency department with chief complaint of bilateral eye irritation.  She states that the symptoms started in the left eye last night.  She reports a scratching and a burning sensation in the left eye that is now spreading to the right eye.  She denies any known foreign body.  She states that her son recently had something similar.  She denies any prior treatments.  She complains of slightly blurred vision.  Denies any flashes or floaters.  The history is provided by the patient. No language interpreter was used.    Past Medical History:  Diagnosis Date  . Medical history non-contributory     Patient Active Problem List   Diagnosis Date Noted  . ASCUS of cervix with negative high risk HPV 09/18/2016  . Marijuana abuse 04/26/2016    Past Surgical History:  Procedure Laterality Date  . WISDOM TOOTH EXTRACTION       OB History    Gravida  1   Para  1   Term  1   Preterm  0   AB  0   Living  1     SAB  0   TAB  0   Ectopic  0   Multiple  0   Live Births  1            Home Medications    Prior to Admission medications   Medication Sig Start Date End Date Taking? Authorizing Provider  ibuprofen (ADVIL,MOTRIN) 600 MG tablet Take 1 tablet (600 mg total) by mouth every 6 (six) hours as needed. Patient not taking: Reported on 10/23/2017 10/04/16   Allie Bossierove, Myra C, MD    Family History Family History  Problem Relation Age of Onset  . Thyroid disease Mother   . Diabetes Father   . Stroke Father   . Thyroid disease Maternal Aunt   . Cancer Maternal Grandmother     Social History Social History   Tobacco Use  . Smoking status: Current Every Day Smoker   Packs/day: 0.00    Types: Cigarettes  . Smokeless tobacco: Never Used  Substance Use Topics  . Alcohol use: Yes  . Drug use: Yes    Frequency: 7.0 times per week    Types: Marijuana     Allergies   Penicillins   Review of Systems Review of Systems  All other systems reviewed and are negative.    Physical Exam Updated Vital Signs BP (!) 141/91 (BP Location: Left Arm)   Pulse 85   Temp 98.9 F (37.2 C) (Oral)   Resp 18   LMP 10/16/2017   SpO2 99%   Physical Exam  Constitutional: She is oriented to person, place, and time. No distress.  HENT:  Head: Normocephalic and atraumatic.  Eyes: Pupils are equal, round, and reactive to light. EOM are normal.  Mild conjunctival erythema bilaterally, no fluorescein uptake, no visible foreign body  Neck: No tracheal deviation present.  Cardiovascular: Normal rate.  Pulmonary/Chest: Effort normal. No respiratory distress.  Abdominal: Soft.  Musculoskeletal: Normal range of motion.  Neurological: She is alert and oriented to person, place, and time.  Skin: Skin is warm and dry. She is  not diaphoretic.  Psychiatric: Judgment normal.  Nursing note and vitals reviewed.    ED Treatments / Results  Labs (all labs ordered are listed, but only abnormal results are displayed) Labs Reviewed - No data to display  EKG None  Radiology No results found.  Procedures Procedures (including critical care time)  Medications Ordered in ED Medications  tetracaine (PONTOCAINE) 0.5 % ophthalmic solution 1 drop (has no administration in time range)  fluorescein ophthalmic strip 1 strip (has no administration in time range)     Initial Impression / Assessment and Plan / ED Course  I have reviewed the triage vital signs and the nursing notes.  Pertinent labs & imaging results that were available during my care of the patient were reviewed by me and considered in my medical decision making (see chart for details).     Patient with  itchy watery eyes with mild conjunctival erythema.  Started in the left eye and spread to the right eye.  Final Clinical Impressions(s) / ED Diagnoses   Final diagnoses:  Acute conjunctivitis of both eyes, unspecified acute conjunctivitis type    ED Discharge Orders    None       Roxy Horseman, PA-C 10/24/17 1610    Pricilla Loveless, MD 10/25/17 1710

## 2018-01-25 ENCOUNTER — Ambulatory Visit (INDEPENDENT_AMBULATORY_CARE_PROVIDER_SITE_OTHER): Payer: Self-pay | Admitting: General Practice

## 2018-01-25 DIAGNOSIS — Z3202 Encounter for pregnancy test, result negative: Secondary | ICD-10-CM

## 2018-01-25 LAB — POCT PREGNANCY, URINE: Preg Test, Ur: NEGATIVE

## 2018-01-25 NOTE — Progress Notes (Signed)
Patient presents to office today for UPT. UPT -. Patient states she has taken two pregnancy tests on Tuesday, which were negative. LMP 01/13/18. Patient states she has been feeling symptoms so she wanted to make sure she wasn't pregnant. Discussed with patient it's too soon to tell and advised she wait until next cycle to take UPT. Patient verbalized understanding & had no questions.

## 2018-01-25 NOTE — Progress Notes (Signed)
Chart reviewed for nurse visit. Agree with plan of care.   Rolm Bookbinder, [place] 01/25/2018 12:59 PM

## 2018-12-13 ENCOUNTER — Ambulatory Visit: Payer: Medicaid Other | Admitting: Obstetrics and Gynecology

## 2018-12-24 ENCOUNTER — Telehealth: Payer: Self-pay | Admitting: Obstetrics and Gynecology

## 2019-01-15 ENCOUNTER — Ambulatory Visit: Payer: Medicaid Other | Admitting: Certified Nurse Midwife

## 2019-06-05 ENCOUNTER — Other Ambulatory Visit (HOSPITAL_COMMUNITY)
Admission: RE | Admit: 2019-06-05 | Discharge: 2019-06-05 | Disposition: A | Discharge status: Home / Self Care | Payer: Medicaid Other | Source: Ambulatory Visit | Attending: Obstetrics and Gynecology | Admitting: Obstetrics and Gynecology

## 2019-06-05 ENCOUNTER — Encounter: Payer: Self-pay | Admitting: Obstetrics and Gynecology

## 2019-06-05 ENCOUNTER — Other Ambulatory Visit: Payer: Self-pay

## 2019-06-05 ENCOUNTER — Ambulatory Visit: Payer: Medicaid Other | Admitting: Obstetrics and Gynecology

## 2019-06-05 VITALS — BP 117/72 | HR 84 | Temp 98.0°F | Ht 68.0 in | Wt 227.0 lb

## 2019-06-05 DIAGNOSIS — Z113 Encounter for screening for infections with a predominantly sexual mode of transmission: Secondary | ICD-10-CM | POA: Insufficient documentation

## 2019-06-05 DIAGNOSIS — Z124 Encounter for screening for malignant neoplasm of cervix: Secondary | ICD-10-CM | POA: Diagnosis present

## 2019-06-05 DIAGNOSIS — B372 Candidiasis of skin and nail: Secondary | ICD-10-CM

## 2019-06-05 DIAGNOSIS — Z01419 Encounter for gynecological examination (general) (routine) without abnormal findings: Secondary | ICD-10-CM

## 2019-06-05 MED ORDER — NYSTATIN 100000 UNIT/GM EX CREA: 1.0000 "application " | TOPICAL_CREAM | Freq: Two times a day (BID) | CUTANEOUS | 0 refills | Status: DC

## 2019-06-05 NOTE — Progress Notes (Signed)
   WELL-WOMAN PHYSICAL & PAP [name] MRN [id number]  Date of birth: 09-Dec-1995 Chief Complaint:   Gynecologic Exam  History of Present Illness:   Marisa Page is a 24 y.o. G45P1001 African American female being seen today for a routine well-woman exam. She reports that she took Plan B on 05/30/19 for unprotected sex that she had on 1/20,1/21,1/28, and 1/29.  Current complaints: needs a pap and requesting STI testing  PCP: none      does desire labs Patient's last menstrual period was 05/25/2019 (exact date). The current method of family planning is none.  Last pap 08/2016. Results were: abnormal  ASCUS Last mammogram: N/A. Results were: N/A. Family h/o breast cancer: No Last colonoscopy: N/A. Results were: N/A. Family h/o colorectal cancer: No Review of Systems:   Pertinent items are noted in HPI Denies any headaches, blurred vision, fatigue, shortness of breath, chest pain, abdominal pain, abnormal vaginal discharge/itching/odor/irritation, problems with periods, bowel movements, urination, or intercourse unless otherwise stated above. Pertinent History Reviewed:  Reviewed past medical,surgical, social and family history.  Reviewed problem list, medications and allergies. Physical Assessment:   Vitals:   06/05/19 1452  BP: 117/72  Pulse: 84  Temp: 98 F (36.7 C)  TempSrc: Oral  Weight: 227 lb (103 kg)  Height: 5\' 8"  (1.727 m)  Body mass index is 34.52 kg/m.        Physical Examination:   General appearance - well appearing, and in no distress  Mental status - alert, oriented to person, place, and time  Psych:  She has a normal mood and affect  Skin - warm and dry, normal color, no suspicious lesions noted  Chest - effort normal, all lung fields clear to auscultation bilaterally  Heart - normal rate and regular rhythm  Neck:  midline trachea, no thyromegaly or nodules  Breasts - breasts appear normal, no suspicious masses, no skin  or nipple changes or  axillary nodes  Abdomen - soft, nontender, nondistended, no masses or organomegaly  Pelvic - VULVA: normal appearing vulva with no masses, tenderness or lesions  VAGINA: normal appearing vagina with normal color and discharge, no lesions  CERVIX: normal appearing cervix without discharge or lesions, no CMT  Thin prep pap is done no HR HPV cotesting  UTERUS: uterus is felt to be normal size, shape, consistency and nontender   ADNEXA: No adnexal masses or tenderness noted.  Rectal - deferred  Extremities:  No swelling or varicosities noted  No results found for this or any previous visit (from the past 24 hour(s)).  Assessment & Plan:  1) Women's annual routine gynecological examination  - Cytology - PAP( [place]),  - Cervicovaginal ancillary only( [place])  2) Screen for STD (sexually transmitted disease)  - Cervicovaginal ancillary only( [place]),  - HIV Antibody (routine testing w rflx),  - RPR  Cutaneous candidiasis - Plan: nystatin cream (MYCOSTATIN)    Labs/procedures today:Pap, STI testing   Orders Placed This Encounter  Procedures  . HIV Antibody (routine testing w rflx)  . RPR    Meds: No orders of the defined types were placed in this encounter.   Follow-up: Return in about 1 year (around 06/04/2020) for Annual Exam.  08/02/2020 MSN, CNM 06/05/2019 3:32 PM

## 2019-06-06 DIAGNOSIS — Z01419 Encounter for gynecological examination (general) (routine) without abnormal findings: Secondary | ICD-10-CM | POA: Insufficient documentation

## 2019-06-06 DIAGNOSIS — Z113 Encounter for screening for infections with a predominantly sexual mode of transmission: Secondary | ICD-10-CM | POA: Insufficient documentation

## 2019-06-06 DIAGNOSIS — Z124 Encounter for screening for malignant neoplasm of cervix: Secondary | ICD-10-CM | POA: Insufficient documentation

## 2019-06-06 DIAGNOSIS — B372 Candidiasis of skin and nail: Secondary | ICD-10-CM | POA: Insufficient documentation

## 2019-06-06 DIAGNOSIS — B379 Candidiasis, unspecified: Secondary | ICD-10-CM

## 2019-06-06 LAB — CERVICOVAGINAL ANCILLARY ONLY
Bacterial Vaginitis (gardnerella): NEGATIVE
Candida Glabrata: NEGATIVE
Candida Vaginitis: POSITIVE — AB
Chlamydia: NEGATIVE
Comment: NEGATIVE
Comment: NEGATIVE
Comment: NEGATIVE
Comment: NEGATIVE
Comment: NEGATIVE
Comment: NORMAL
Neisseria Gonorrhea: NEGATIVE
Trichomonas: NEGATIVE

## 2019-06-06 LAB — CYTOLOGY - PAP: Diagnosis: NEGATIVE

## 2019-06-06 LAB — RPR: RPR Ser Ql: NONREACTIVE

## 2019-06-06 LAB — HIV ANTIBODY (ROUTINE TESTING W REFLEX): HIV Screen 4th Generation wRfx: NONREACTIVE

## 2019-06-11 MED ORDER — FLUCONAZOLE 150 MG PO TABS: 150.0000 mg | ORAL_TABLET | Freq: Once | ORAL | 0 refills | Status: AC

## 2019-06-11 NOTE — Telephone Encounter (Signed)
-----   Message from [name], [place] sent at 06/10/2019  8:33 PM EST ----- Please notify of NORMAL pap and please treat for yeast

## 2019-06-11 NOTE — Telephone Encounter (Signed)
Patient called, verified DOB. Patient informed of normal PAP and need treatment for yeast infection. Diflucan 150 mg 1 tablet PO.  Clovis Pu, RN

## 2019-10-09 ENCOUNTER — Other Ambulatory Visit: Payer: Self-pay

## 2019-10-09 ENCOUNTER — Emergency Department (HOSPITAL_BASED_OUTPATIENT_CLINIC_OR_DEPARTMENT_OTHER)
Admission: EM | Admit: 2019-10-09 | Discharge: 2019-10-09 | Disposition: A | Discharge status: Home / Self Care | Payer: Medicaid Other | Attending: Emergency Medicine | Admitting: Emergency Medicine

## 2019-10-09 ENCOUNTER — Encounter (HOSPITAL_BASED_OUTPATIENT_CLINIC_OR_DEPARTMENT_OTHER): Payer: Self-pay | Admitting: *Deleted

## 2019-10-09 ENCOUNTER — Encounter (HOSPITAL_COMMUNITY): Payer: Self-pay | Admitting: Emergency Medicine

## 2019-10-09 ENCOUNTER — Emergency Department (HOSPITAL_COMMUNITY)
Admission: EM | Admit: 2019-10-09 | Discharge: 2019-10-09 | Disposition: A | Discharge status: Home / Self Care | Payer: Medicaid Other | Attending: Emergency Medicine | Admitting: Emergency Medicine

## 2019-10-09 DIAGNOSIS — R630 Anorexia: Secondary | ICD-10-CM | POA: Insufficient documentation

## 2019-10-09 DIAGNOSIS — Z5321 Procedure and treatment not carried out due to patient leaving prior to being seen by health care provider: Secondary | ICD-10-CM | POA: Insufficient documentation

## 2019-10-09 DIAGNOSIS — F1210 Cannabis abuse, uncomplicated: Secondary | ICD-10-CM | POA: Diagnosis not present

## 2019-10-09 DIAGNOSIS — Z87891 Personal history of nicotine dependence: Secondary | ICD-10-CM | POA: Diagnosis not present

## 2019-10-09 DIAGNOSIS — L0291 Cutaneous abscess, unspecified: Secondary | ICD-10-CM | POA: Diagnosis present

## 2019-10-09 DIAGNOSIS — L0231 Cutaneous abscess of buttock: Secondary | ICD-10-CM | POA: Insufficient documentation

## 2019-10-09 DIAGNOSIS — R5383 Other fatigue: Secondary | ICD-10-CM | POA: Insufficient documentation

## 2019-10-09 MED ORDER — ACETAMINOPHEN 325 MG PO TABS
650.0000 mg | ORAL_TABLET | Freq: Once | ORAL | Status: AC
  Administered 2019-10-09: 650 mg via ORAL
  Filled 2019-10-09: qty 2

## 2019-10-09 MED ORDER — DOXYCYCLINE HYCLATE 100 MG PO CAPS
100.0000 mg | ORAL_CAPSULE | Freq: Two times a day (BID) | ORAL | 0 refills | Status: AC
Start: 2019-10-09 — End: 2019-10-16

## 2019-10-09 MED ORDER — ONDANSETRON 4 MG PO TBDP
4.0000 mg | ORAL_TABLET | Freq: Three times a day (TID) | ORAL | 0 refills | Status: DC | PRN
Start: 2019-10-09 — End: 2020-07-19

## 2019-10-09 MED ORDER — LIDOCAINE-EPINEPHRINE (PF) 2 %-1:200000 IJ SOLN
20.0000 mL | Freq: Once | INTRAMUSCULAR | Status: AC
  Administered 2019-10-09: 20 mL
  Filled 2019-10-09: qty 20

## 2019-10-09 NOTE — ED Triage Notes (Signed)
Patient is complaining of abscess on left buttock cheek

## 2019-10-09 NOTE — ED Triage Notes (Signed)
Pt reports abcess to her "private area" x 3 days. Denies any fevers.

## 2019-10-09 NOTE — Discharge Instructions (Addendum)
Please follow up with your primary care doctor or return to ED in 2 days for wound check.   Take antibiotics as prescribed  Use Zofran as needed for any nausea.

## 2019-10-09 NOTE — ED Provider Notes (Signed)
[facility] EMERGENCY DEPARTMENT Provider Note   CSN: [id number] Arrival date & time: 10/09/19  1623     History Chief Complaint  Patient presents with  . Abscess    Marisa Page is a 24 y.o. female.  HPI Patient is a 24 year old female with no pertinent past medical history presented today with pain to the left gluteus.   She has noticed swelling there.  She denies any fevers, chills, nausea, vomiting.  She states she has had somewhat decreased appetite and some fatigue however.  She has no history of abscesses in the past.  She denies any vaginal discharge or vaginal pain.  Denies any concern for STDs.  She describes the pain is achy, severe, worse with sitting and touch.    Past Medical History:  Diagnosis Date  . Medical history non-contributory     Patient Active Problem List   Diagnosis Date Noted  . Women's annual routine gynecological examination 06/06/2019  . Screen for STD (sexually transmitted disease) 06/06/2019  . Cutaneous candidiasis 06/06/2019  . Pap smear for cervical cancer screening 06/06/2019  . ASCUS of cervix with negative high risk HPV 09/18/2016  . Marijuana abuse 04/26/2016    Past Surgical History:  Procedure Laterality Date  . WISDOM TOOTH EXTRACTION       OB History    Gravida  1   Para  1   Term  1   Preterm  0   AB  0   Living  1     SAB  0   TAB  0   Ectopic  0   Multiple  0   Live Births  1           Family History  Problem Relation Age of Onset  . Thyroid disease Mother   . Diabetes Father   . Stroke Father   . Thyroid disease Maternal Aunt   . Cancer Maternal Grandmother     Social History   Tobacco Use  . Smoking status: Former Smoker    Packs/day: 0.00    Types: Cigarettes  . Smokeless tobacco: Never Used  Vaping Use  . Vaping Use: Never used  Substance Use Topics  . Alcohol use: Yes  . Drug use: Yes    Frequency: 7.0 times per week    Types: Marijuana    Home  Medications Prior to Admission medications   Medication Sig Start Date End Date Taking? Authorizing Provider  doxycycline (VIBRAMYCIN) 100 MG capsule Take 1 capsule (100 mg total) by mouth 2 (two) times daily for 7 days. 10/09/19 10/16/19  Gailen Shelter, PA  ibuprofen (ADVIL,MOTRIN) 600 MG tablet Take 1 tablet (600 mg total) by mouth every 6 (six) hours as needed. Patient not taking: Reported on 10/23/2017 10/04/16   Allie Bossier, MD  nystatin cream (MYCOSTATIN) Apply 1 application topically 2 (two) times daily. 06/05/19   [name], CNM  ondansetron (ZOFRAN ODT) 4 MG disintegrating tablet Take 1 tablet (4 mg total) by mouth every 8 (eight) hours as needed for nausea or vomiting. 10/09/19   Gailen Shelter, PA    Allergies    Penicillins  Review of Systems   Review of Systems  Constitutional: Negative for fever.  HENT: Negative for congestion.   Respiratory: Negative for shortness of breath.   Cardiovascular: Negative for chest pain.  Gastrointestinal: Negative for abdominal distention.  Skin:       Abscess left buttocks   Neurological: Negative for dizziness and headaches.  Physical Exam Updated Vital Signs BP 109/65 (BP Location: Right Arm)   Pulse 80   Temp 98.4 F (36.9 C) (Oral)   Resp 16   LMP 09/24/2019   SpO2 99%   Physical Exam Vitals and nursing note reviewed.  Constitutional:      General: She is in acute distress.     Appearance: Normal appearance. She is not ill-appearing.     Comments: Acutely uncomfortable  HENT:     Head: Normocephalic and atraumatic.     Mouth/Throat:     Mouth: Mucous membranes are moist.  Eyes:     General: No scleral icterus.       Right eye: No discharge.        Left eye: No discharge.     Conjunctiva/sclera: Conjunctivae normal.  Cardiovascular:     Comments: Heart rate approximately 100 on my exam Pulmonary:     Effort: Pulmonary effort is normal.     Breath sounds: No stridor.  Abdominal:     Palpations: Abdomen is  soft.     Tenderness: There is no abdominal tenderness.  Skin:    General: Skin is warm and dry.     Comments: Approximately 6 cm circular area of erythema noted to the left buttocks with area of fluctuance centrally  Neurological:     Mental Status: She is alert and oriented to person, place, and time. Mental status is at baseline.  Psychiatric:        Mood and Affect: Mood normal.        Behavior: Behavior normal.     ED Results / Procedures / Treatments   Labs (all labs ordered are listed, but only abnormal results are displayed) Labs Reviewed - No data to display  EKG None  Radiology No results found.  Procedures .Marland KitchenIncision and Drainage  Date/Time: 10/09/2019 5:54 PM Performed by: Tedd Sias, PA Authorized by: Tedd Sias, PA   Consent:    Consent obtained:  Verbal   Consent given by:  Patient   Risks discussed:  Bleeding, incomplete drainage, pain and damage to other organs   Alternatives discussed:  No treatment Universal protocol:    Procedure explained and questions answered to patient or proxy's satisfaction: yes     Relevant documents present and verified: yes     Test results available and properly labeled: yes     Imaging studies available: yes     Required blood products, implants, devices, and special equipment available: yes     Site/side marked: yes     Immediately prior to procedure a time out was called: yes     Patient identity confirmed:  Verbally with patient Location:    Type:  Abscess   Size:  3 cm   Location:  Anogenital   Anogenital location: left gluteal region. Pre-procedure details:    Skin preparation:  Chloraprep Anesthesia (see MAR for exact dosages):    Anesthesia method:  Local infiltration   Local anesthetic:  Lidocaine 2% WITH epi Procedure type:    Complexity:  Complex Procedure details:    Incision type: parallel incisions x2.   Incision depth:  Subcutaneous   Scalpel blade:  11   Wound management:  Probed and  deloculated, irrigated with saline and extensive cleaning   Drainage:  Purulent   Drainage amount:  Copious   Wound treatment:  Drain placed (Loop drainage to think he is.  Nitrile strip used as quick.) Post-procedure details:    Patient tolerance of  procedure:  Tolerated well, no immediate complications   (including critical care time)  Medications Ordered in ED Medications  lidocaine-EPINEPHrine (XYLOCAINE W/EPI) 2 %-1:200000 (PF) injection 20 mL (20 mLs Infiltration Given 10/09/19 1705)  acetaminophen (TYLENOL) tablet 650 mg (650 mg Oral Given 10/09/19 1705)    ED Course  I have reviewed the triage vital signs and the nursing notes.  Pertinent labs & imaging results that were available during my care of the patient were reviewed by me and considered in my medical decision making (see chart for details).    MDM Rules/Calculators/A&P                          Patient is a 24 year old female with medical history detailed above.  Presented with abscess of the left buttock.  See HPI for full details.  Patient did have mild tachycardia initially however after Tylenol incision and drainage she had significant relief of pain and tachycardia resolved.  She is afebrile.  Low suspicion for sepsis.  Patient will be started on doxycycline and given Zofran as needed for nausea.  She will follow up in 2 days for wound check either here in the ED or at her care doctor office or urgent care.  She is agreeable to plan.  Is tolerating p.o.  Tolerated procedure well.  Significant drainage with incision and drainage.  I placed nitrile glove as a loop drainage. Patient given strict return precautions.  Final Clinical Impression(s) / ED Diagnoses Final diagnoses:  Abscess of buttock, left    Rx / DC Orders ED Discharge Orders         Ordered    doxycycline (VIBRAMYCIN) 100 MG capsule  2 times daily     Discontinue  Reprint     10/09/19 1753    ondansetron (ZOFRAN ODT) 4 MG disintegrating tablet   Every 8 hours PRN     Discontinue  Reprint     10/09/19 1753           Gailen Shelter, PA 10/09/19 1802    Maia Plan, MD 10/09/19 1836

## 2019-10-09 NOTE — ED Notes (Signed)
Pt has golf ball size red area to  Left buttocks very tender to touch states she waxes that area

## 2019-10-10 ENCOUNTER — Ambulatory Visit: Payer: Medicaid Other

## 2019-10-12 ENCOUNTER — Encounter (HOSPITAL_BASED_OUTPATIENT_CLINIC_OR_DEPARTMENT_OTHER): Payer: Self-pay

## 2019-10-12 ENCOUNTER — Emergency Department (HOSPITAL_BASED_OUTPATIENT_CLINIC_OR_DEPARTMENT_OTHER)
Admission: EM | Admit: 2019-10-12 | Discharge: 2019-10-12 | Disposition: A | Discharge status: Home / Self Care | Payer: Medicaid Other | Attending: Emergency Medicine | Admitting: Emergency Medicine

## 2019-10-12 ENCOUNTER — Other Ambulatory Visit: Payer: Self-pay

## 2019-10-12 DIAGNOSIS — Z4800 Encounter for change or removal of nonsurgical wound dressing: Secondary | ICD-10-CM | POA: Insufficient documentation

## 2019-10-12 DIAGNOSIS — Z880 Allergy status to penicillin: Secondary | ICD-10-CM | POA: Insufficient documentation

## 2019-10-12 DIAGNOSIS — Z79899 Other long term (current) drug therapy: Secondary | ICD-10-CM | POA: Diagnosis not present

## 2019-10-12 DIAGNOSIS — Z87891 Personal history of nicotine dependence: Secondary | ICD-10-CM | POA: Diagnosis not present

## 2019-10-12 DIAGNOSIS — Z5189 Encounter for other specified aftercare: Secondary | ICD-10-CM

## 2019-10-12 NOTE — ED Provider Notes (Signed)
[place] EMERGENCY DEPARTMENT Provider Note   CSN: [id number] Arrival date & time: 10/12/19  1530     History Chief Complaint  Patient presents with  . Wound Check    Marisa Page is a 24 y.o. female.  Marisa Page is a 24 y.o. female who is otherwise healthy, presents to the ED for wound check.  She was seen in the emergency department on 5/10 for evaluation of abscess to the left lower buttock.  Patient had incision and drainage performed and nitrile glove used to place a loop drain.  She states since then the area has been steadily improving she reports minimal pain in this area now and that she has not noted any more drainage.  She has been taking her antibiotics as directed.  She is able to sit down with less discomfort now.  No fevers.  No other aggravating or relieving factors.        Past Medical History:  Diagnosis Date  . Medical history non-contributory     Patient Active Problem List   Diagnosis Date Noted  . Women's annual routine gynecological examination 06/06/2019  . Screen for STD (sexually transmitted disease) 06/06/2019  . Cutaneous candidiasis 06/06/2019  . Pap smear for cervical cancer screening 06/06/2019  . ASCUS of cervix with negative high risk HPV 09/18/2016  . Marijuana abuse 04/26/2016    Past Surgical History:  Procedure Laterality Date  . WISDOM TOOTH EXTRACTION       OB History    Gravida  1   Para  1   Term  1   Preterm  0   AB  0   Living  1     SAB  0   TAB  0   Ectopic  0   Multiple  0   Live Births  1           Family History  Problem Relation Age of Onset  . Thyroid disease Mother   . Diabetes Father   . Stroke Father   . Thyroid disease Maternal Aunt   . Cancer Maternal Grandmother     Social History   Tobacco Use  . Smoking status: Former Smoker    Packs/day: 0.00    Types: Cigarettes  . Smokeless tobacco: Never Used  Vaping Use  . Vaping Use: Never  used  Substance Use Topics  . Alcohol use: Yes  . Drug use: Yes    Frequency: 7.0 times per week    Types: Marijuana    Home Medications Prior to Admission medications   Medication Sig Start Date End Date Taking? Authorizing Provider  doxycycline (VIBRAMYCIN) 100 MG capsule Take 1 capsule (100 mg total) by mouth 2 (two) times daily for 7 days. 10/09/19 10/16/19  Tedd Sias, PA  ibuprofen (ADVIL,MOTRIN) 600 MG tablet Take 1 tablet (600 mg total) by mouth every 6 (six) hours as needed. Patient not taking: Reported on 10/23/2017 10/04/16   Emily Filbert, MD  nystatin cream (MYCOSTATIN) Apply 1 application topically 2 (two) times daily. 06/05/19   Laury Deep, CNM  ondansetron (ZOFRAN ODT) 4 MG disintegrating tablet Take 1 tablet (4 mg total) by mouth every 8 (eight) hours as needed for nausea or vomiting. 10/09/19   Tedd Sias, PA    Allergies    Penicillins  Review of Systems   Review of Systems  Constitutional: Negative for chills and fever.  Skin: Positive for wound.    Physical Exam Updated Vital  Signs BP 127/70 (BP Location: Right Arm)   Pulse 84   Temp 98.5 F (36.9 C) (Oral)   Resp 18   Ht 5\' 8"  (1.727 m)   Wt 98.4 kg   LMP 09/24/2019   SpO2 100%   BMI 32.99 kg/m   Physical Exam Vitals and nursing note reviewed.  Constitutional:      General: She is not in acute distress.    Appearance: Normal appearance. She is well-developed. She is not ill-appearing or diaphoretic.     Comments: Well-appearing and in no distress  HENT:     Head: Normocephalic and atraumatic.  Eyes:     General:        Right eye: No discharge.        Left eye: No discharge.  Pulmonary:     Effort: Pulmonary effort is normal. No respiratory distress.  Genitourinary:    Comments: Nitrile loop drain in place at the bottom of the left buttock where I&D was performed, there is no surrounding erythema, no expressible drainage and tenderness and induration has significantly improved.   Nitrile drain removed without difficulty. Neurological:     Mental Status: She is alert.     Coordination: Coordination normal.  Psychiatric:        Mood and Affect: Mood normal.        Behavior: Behavior normal.     ED Results / Procedures / Treatments   Labs (all labs ordered are listed, but only abnormal results are displayed) Labs Reviewed - No data to display  EKG None  Radiology No results found.  Procedures Procedures (including critical care time)  Medications Ordered in ED Medications - No data to display  ED Course  I have reviewed the triage vital signs and the nursing notes.  Pertinent labs & imaging results that were available during my care of the patient were reviewed by me and considered in my medical decision making (see chart for details).    MDM Rules/Calculators/A&P                         24 year old female presents for wound check and drain removal after she was seen in the ED 3 days ago for incision and drainage of abscess to the left lower buttock.  Area has improved significantly no continued drainage induration and swelling significantly improving and patient reports discomfort has almost resolved.  She has been taking her antibiotics as directed.  The area appears improved on exam and the drain was removed without difficulty, tolerated well by patient.  Encourage patient to continue to monitor area, continue with warm compresses and take entire course of antibiotics as directed.  Final Clinical Impression(s) / ED Diagnoses Final diagnoses:  Wound check, abscess    Rx / DC Orders ED Discharge Orders    None       25 10/12/19 1814    10/14/19, MD 10/14/19 1954

## 2019-10-12 NOTE — ED Triage Notes (Signed)
Pt to ED to follow up r/t abscess that she had drained and to have "drain" removed from it.

## 2019-10-12 NOTE — Discharge Instructions (Signed)
Make sure you take entire course of antibiotics, you can continue to apply warm compresses the area.  It appears to be healing and improving well.

## 2019-11-05 ENCOUNTER — Other Ambulatory Visit: Payer: Self-pay

## 2019-11-05 ENCOUNTER — Encounter (HOSPITAL_BASED_OUTPATIENT_CLINIC_OR_DEPARTMENT_OTHER): Payer: Self-pay

## 2019-11-05 ENCOUNTER — Emergency Department (HOSPITAL_BASED_OUTPATIENT_CLINIC_OR_DEPARTMENT_OTHER)
Admission: EM | Admit: 2019-11-05 | Discharge: 2019-11-05 | Disposition: A | Discharge status: Home / Self Care | Payer: Medicaid Other | Attending: Emergency Medicine | Admitting: Emergency Medicine

## 2019-11-05 DIAGNOSIS — Z5321 Procedure and treatment not carried out due to patient leaving prior to being seen by health care provider: Secondary | ICD-10-CM | POA: Insufficient documentation

## 2019-11-05 DIAGNOSIS — K6289 Other specified diseases of anus and rectum: Secondary | ICD-10-CM | POA: Insufficient documentation

## 2019-11-05 NOTE — ED Triage Notes (Signed)
C/o rectal pain with BM/constipation x 3 days-NAD-steady gait

## 2019-12-04 ENCOUNTER — Encounter (HOSPITAL_BASED_OUTPATIENT_CLINIC_OR_DEPARTMENT_OTHER): Payer: Self-pay

## 2019-12-09 ENCOUNTER — Ambulatory Visit (INDEPENDENT_AMBULATORY_CARE_PROVIDER_SITE_OTHER): Payer: Medicaid Other | Admitting: *Deleted

## 2019-12-09 ENCOUNTER — Encounter: Payer: Self-pay | Admitting: *Deleted

## 2019-12-09 ENCOUNTER — Other Ambulatory Visit: Payer: Self-pay

## 2019-12-09 VITALS — BP 108/69 | HR 87 | Temp 98.0°F | Ht 68.0 in | Wt 215.4 lb

## 2019-12-09 DIAGNOSIS — N760 Acute vaginitis: Secondary | ICD-10-CM

## 2019-12-09 DIAGNOSIS — Z719 Counseling, unspecified: Secondary | ICD-10-CM

## 2019-12-09 NOTE — Progress Notes (Signed)
Patient in clinic today for self-swab. However, patient had other complaints of ingrown hair or questionable cysts in vaginal are. Patient reported that 2 more has popped up. Patient had history of having the ingrown hair drained. Patient also reported maybe having a nipple infection. She was seen a little while back, but did not follow up. Advised patient that she would need to see a provider for those complaints. Appointment scheduled for Thursday 12/11/19 with Marisa Page, CNM.  Clovis Pu, RN

## 2019-12-11 ENCOUNTER — Other Ambulatory Visit: Payer: Self-pay

## 2019-12-11 ENCOUNTER — Ambulatory Visit: Payer: Medicaid Other | Admitting: Obstetrics and Gynecology

## 2019-12-11 ENCOUNTER — Other Ambulatory Visit (HOSPITAL_COMMUNITY)
Admission: RE | Admit: 2019-12-11 | Discharge: 2019-12-11 | Disposition: A | Discharge status: Home / Self Care | Payer: Medicaid Other | Source: Ambulatory Visit | Attending: Obstetrics and Gynecology | Admitting: Obstetrics and Gynecology

## 2019-12-11 ENCOUNTER — Encounter: Payer: Self-pay | Admitting: Obstetrics and Gynecology

## 2019-12-11 VITALS — BP 111/72 | HR 72 | Wt 216.4 lb

## 2019-12-11 DIAGNOSIS — N926 Irregular menstruation, unspecified: Secondary | ICD-10-CM

## 2019-12-11 DIAGNOSIS — N898 Other specified noninflammatory disorders of vagina: Secondary | ICD-10-CM

## 2019-12-11 LAB — POCT URINE PREGNANCY: Preg Test, Ur: NEGATIVE

## 2019-12-11 NOTE — Progress Notes (Signed)
  GYNECOLOGY PROGRESS NOTE  History:  Marisa Page is a 24 y.o. G1P1001 presents to [facility] office today for problem gyn visit. She reports irregular bleeding after taking Plan B on 12/03/2019. She reports the VB has stopped now. She also reports a "cyst" that needed to be cut open and drained  She denies h/a, dizziness, shortness of breath, n/v, or fever/chills.    The following portions of the patient's history were reviewed and updated as appropriate: allergies, current medications, past family history, past medical history, past social history, past surgical history and problem list. Last pap smear on 06/05/2019 was normal.  Review of Systems:  Pertinent items are noted in HPI.   Objective:  Physical Exam Blood pressure 111/72, pulse 72, weight 216 lb 6.4 oz (98.2 kg). VS reviewed, nursing note reviewed,  Constitutional: well developed, well nourished, no distress HEENT: normocephalic CV: normal rate Pulm/chest wall: normal effort Breast Exam: deferred Abdomen: soft Neuro: alert and oriented x 3 Skin: warm, dry Psych: affect normal Pelvic exam: Cervix pink, visually closed, without lesion, scant white creamy discharge, vaginal walls and external genitalia normal -- no inflammation or sign of infection at or near Bartholin glands Bimanual exam: Cervix 0/long/high, firm, anterior, neg CMT, uterus nontender, nonenlarged, adnexa without tenderness, enlargement, or mass Scar tissue noted on LT buttock near gluteal fold  Assessment & Plan:  1. Irregular bleeding - POCT urine pregnancy - Advised that take Plan B will cause irregular vaginal bleeding - Explained there is nothing to do   2. Vaginal irritation - Cervicovaginal ancillary only( [place]) - RPR - HIV Antibody (routine testing w rflx) - Hepatitis C Antibody - Explained that the area she points out as being "irritated or sensitive" on her buttocks is where she was lanced   Marisa Page,  CNM 12/11/2019

## 2019-12-11 NOTE — Progress Notes (Signed)
Patient reports having an irregular cycle since taking a Plan B on 12/03/2019.

## 2019-12-12 ENCOUNTER — Other Ambulatory Visit: Payer: Self-pay | Admitting: Obstetrics and Gynecology

## 2019-12-12 DIAGNOSIS — B9689 Other specified bacterial agents as the cause of diseases classified elsewhere: Secondary | ICD-10-CM

## 2019-12-12 LAB — RPR: RPR Ser Ql: NONREACTIVE

## 2019-12-12 LAB — CERVICOVAGINAL ANCILLARY ONLY
Bacterial Vaginitis (gardnerella): POSITIVE — AB
Candida Glabrata: NEGATIVE
Candida Vaginitis: NEGATIVE
Chlamydia: NEGATIVE
Comment: NEGATIVE
Comment: NEGATIVE
Comment: NEGATIVE
Comment: NEGATIVE
Comment: NEGATIVE
Comment: NORMAL
Neisseria Gonorrhea: NEGATIVE
Trichomonas: NEGATIVE

## 2019-12-12 LAB — HEPATITIS C ANTIBODY: Hep C Virus Ab: <0.1 s/co ratio (ref 0.0–0.9)

## 2019-12-12 LAB — HIV ANTIBODY (ROUTINE TESTING W REFLEX): HIV Screen 4th Generation wRfx: NONREACTIVE

## 2019-12-12 MED ORDER — METRONIDAZOLE 500 MG PO TABS: 500.0000 mg | ORAL_TABLET | Freq: Two times a day (BID) | ORAL | 0 refills | Status: DC

## 2019-12-12 NOTE — Progress Notes (Signed)
Pt notified via My Chart of BV and Rx sent

## 2020-04-12 ENCOUNTER — Encounter: Payer: Self-pay | Admitting: General Practice

## 2020-05-13 ENCOUNTER — Ambulatory Visit: Payer: Medicaid Other | Admitting: Obstetrics and Gynecology

## 2020-05-13 ENCOUNTER — Telehealth: Payer: Self-pay | Admitting: Obstetrics and Gynecology

## 2020-07-19 ENCOUNTER — Ambulatory Visit (INDEPENDENT_AMBULATORY_CARE_PROVIDER_SITE_OTHER): Payer: Medicaid Other | Admitting: *Deleted

## 2020-07-19 ENCOUNTER — Other Ambulatory Visit: Payer: Self-pay

## 2020-07-19 DIAGNOSIS — Z3200 Encounter for pregnancy test, result unknown: Secondary | ICD-10-CM

## 2020-07-19 LAB — POCT PREGNANCY, URINE: Preg Test, Ur: POSITIVE — AB

## 2020-07-19 NOTE — Progress Notes (Signed)
Patient was assessed and managed by nursing staff during this encounter. I have reviewed the chart and agree with the documentation and plan. I have also made any necessary editorial changes.  Warden Fillers, MD 07/19/2020 5:54 PM

## 2020-07-19 NOTE — Progress Notes (Signed)
Pt submitted urine for pregnancy test. I called her and informed of positive result. She reports LMP 05/13/20 which yields EDD 02/17/21, now 9w 4d. Pt denies having vaginal bleeding or abdominal pain. She was advised to begin taking OTC prenatal vitamins and to call [facility] to schedule prenatal care. She was instructed to go to [facility] if she develops heavy vaginal bleeding or abdominal pain. Pt voiced understanding of all information and instructions given.

## 2020-07-22 NOTE — Telephone Encounter (Signed)
Review completed.

## 2020-07-23 ENCOUNTER — Encounter (HOSPITAL_COMMUNITY): Payer: Self-pay | Admitting: Obstetrics and Gynecology

## 2020-07-23 ENCOUNTER — Inpatient Hospital Stay (HOSPITAL_COMMUNITY)
Admission: AD | Admit: 2020-07-23 | Discharge: 2020-07-23 | Disposition: A | Discharge status: Home / Self Care | Payer: Medicaid Other | Attending: Obstetrics and Gynecology | Admitting: Obstetrics and Gynecology

## 2020-07-23 ENCOUNTER — Other Ambulatory Visit: Payer: Self-pay

## 2020-07-23 ENCOUNTER — Inpatient Hospital Stay (HOSPITAL_COMMUNITY): Payer: Medicaid Other

## 2020-07-23 DIAGNOSIS — O26891 Other specified pregnancy related conditions, first trimester: Secondary | ICD-10-CM | POA: Insufficient documentation

## 2020-07-23 DIAGNOSIS — O209 Hemorrhage in early pregnancy, unspecified: Secondary | ICD-10-CM | POA: Diagnosis present

## 2020-07-23 DIAGNOSIS — Z880 Allergy status to penicillin: Secondary | ICD-10-CM | POA: Insufficient documentation

## 2020-07-23 DIAGNOSIS — O3680X0 Pregnancy with inconclusive fetal viability, not applicable or unspecified: Secondary | ICD-10-CM | POA: Diagnosis not present

## 2020-07-23 DIAGNOSIS — Z3A10 10 weeks gestation of pregnancy: Secondary | ICD-10-CM

## 2020-07-23 DIAGNOSIS — Z87891 Personal history of nicotine dependence: Secondary | ICD-10-CM | POA: Insufficient documentation

## 2020-07-23 DIAGNOSIS — O4691 Antepartum hemorrhage, unspecified, first trimester: Secondary | ICD-10-CM | POA: Diagnosis not present

## 2020-07-23 DIAGNOSIS — R109 Unspecified abdominal pain: Secondary | ICD-10-CM | POA: Insufficient documentation

## 2020-07-23 LAB — CBC
HCT: 33.8 % — ABNORMAL LOW (ref 36.0–46.0)
Hemoglobin: 11.7 g/dL — ABNORMAL LOW (ref 12.0–15.0)
MCH: 31 pg (ref 26.0–34.0)
MCHC: 34.6 g/dL (ref 30.0–36.0)
MCV: 89.7 fL (ref 80.0–100.0)
Platelets: 244 10*3/uL (ref 150–400)
RBC: 3.77 MIL/uL — ABNORMAL LOW (ref 3.87–5.11)
RDW: 13.4 % (ref 11.5–15.5)
WBC: 8.3 10*3/uL (ref 4.0–10.5)
nRBC: 0 % (ref 0.0–0.2)

## 2020-07-23 LAB — WET PREP, GENITAL
Clue Cells Wet Prep HPF POC: NONE SEEN
Sperm: NONE SEEN
Trich, Wet Prep: NONE SEEN
Yeast Wet Prep HPF POC: NONE SEEN

## 2020-07-23 LAB — URINALYSIS, ROUTINE W REFLEX MICROSCOPIC
Bacteria, UA: NONE SEEN
Bilirubin Urine: NEGATIVE
Glucose, UA: NEGATIVE mg/dL
Ketones, ur: NEGATIVE mg/dL
Nitrite: NEGATIVE
Protein, ur: NEGATIVE mg/dL
Specific Gravity, Urine: 1.015 (ref 1.005–1.030)
pH: 6 (ref 5.0–8.0)

## 2020-07-23 LAB — ABO/RH: ABO/RH(D): A POS

## 2020-07-23 LAB — HCG, QUANTITATIVE, PREGNANCY: hCG, Beta Chain, Quant, S: 285 m[IU]/mL — ABNORMAL HIGH (ref <5)

## 2020-07-23 NOTE — MAU Note (Signed)
Marisa Page is a 25 y.o. at [redacted]w[redacted]d here in MAU reporting: this AM when she went to the bathroom she saw some light pink spotting. No pain.  LMP: 05/13/20 approximately  Onset of complaint: today  Pain score: 0/10  Vitals:   07/23/20 1026  BP: 117/73  Pulse: 60  Resp: 16  Temp: 98.2 F (36.8 C)  SpO2: 96%     Lab orders placed from triage: UA

## 2020-07-23 NOTE — Discharge Instructions (Signed)
Return to care   If you have heavier bleeding that soaks through more that 2 pads per hour for an hour or more  If you bleed so much that you feel like you might pass out or you do pass out  If you have significant abdominal pain that is not improved with Tylenol        Vaginal Bleeding During Pregnancy, First Trimester A small amount of bleeding from the vagina is common during early pregnancy. This kind of bleeding is also called spotting. Sometimes the bleeding is normal and does not cause problems. At other times, though, bleeding may be a sign of something serious. Normal bleeding in pregnancy can happen:  When the fertilized egg attaches itself to your womb.  When blood vessels change because of the pregnancy.  When you have pelvic exams.  When you have sex. Abnormal bleeding can happen:  When you have an infection.  When you have growths in your womb. The growths are called polyps.  If you are having a miscarriage or at risk of having one.  If you have other problems in your pregnancy. Tell your doctor right away about any bleeding from your vagina. Follow these instructions at home: Watch your bleeding  Watch your condition for any changes. Let your doctor know if you are worried about something.  Try to know what causes your bleeding. Ask yourself these questions: ? Does the bleeding start on its own? ? Does the bleeding start after something is done, such as sex or a pelvic exam?  Use a diary to write the things you see about your bleeding. Write in your diary: ? If the bleeding flows freely without stopping, or if it starts and stops, and then starts again. ? If the bleeding is heavy or light. ? How many pads you use in a day and how much blood is in them.  Tell your doctor if you pass tissue. He or she may want to see it.   Activity  Follow your doctor's instructions about how active you can be. Ask what activities are safe for you.  Do not have sex or  orgasms until your doctor says that this is safe.  If needed, make plans for someone to help with your normal activities. General instructions  Take over-the-counter and prescription medicines only as told by your doctor.  Do not take aspirin because it can cause bleeding.  Do not use tampons.  Do not douche.  Keep all follow-up visits. Contact a doctor if:  You have vaginal bleeding at any time while you are pregnant.  You have cramps.  You have a fever or chills. Get help right away if:  You have very bad cramps in your back or belly (abdomen).  You pass large clots or a lot of tissue from your vagina.  Your bleeding gets worse.  You feel light-headed.  You feel weak.  You pass out (faint).  You have chills.  You are leaking fluid from your vagina.  You have a gush of fluid from your vagina. Summary  Sometimes vaginal bleeding during pregnancy is normal and does not cause problems. At other times, bleeding may be a sign of something serious.  Tell your doctor right away about any bleeding from your vagina.  Follow your doctor's instructions about how active you can be. You may need someone to help you with your normal activities.  Keep all follow-up visits. This information is not intended to replace advice given to you  by your health care provider. Make sure you discuss any questions you have with your health care provider. Document Revised: 01/08/2020 Document Reviewed: 01/08/2020 Elsevier Patient Education  2021 [employer].

## 2020-07-23 NOTE — MAU Provider Note (Signed)
History     CSN: [id number]  Arrival date and time: 07/23/20 1002   Event Date/Time   First Provider Initiated Contact with Patient 07/23/20 1149      Chief Complaint  Patient presents with  . Vaginal Bleeding   HPI Marisa Page is a 25 y.o. G2P1001 at [redacted]w[redacted]d who presents with vaginal bleeding. Symptoms started this morning. Was initially pink spotting and is now brown. Does reports some intermittent lower abdominal cramping. Currently denies pain. Denies dysuria, vaginal discharge, n/v/d. Has intercourse yesterday. Plans on going to [facility] for prenatal care.   OB History    Gravida  2   Para  1   Term  1   Preterm  0   AB  0   Living  1     SAB  0   IAB  0   Ectopic  0   Multiple      Live Births  1           Past Medical History:  Diagnosis Date  . Medical history non-contributory     Past Surgical History:  Procedure Laterality Date  . WISDOM TOOTH EXTRACTION      Family History  Problem Relation Age of Onset  . Thyroid disease Mother   . Diabetes Father   . Stroke Father   . Thyroid disease Maternal Aunt   . Cancer Maternal Grandmother     Social History   Tobacco Use  . Smoking status: Former Smoker    Packs/day: 0.00    Types: Cigarettes  . Smokeless tobacco: Never Used  Vaping Use  . Vaping Use: Never used  Substance Use Topics  . Alcohol use: Yes    Comment: occ  . Drug use: Yes    Frequency: 7.0 times per week    Types: Marijuana    Allergies:  Allergies  Allergen Reactions  . Penicillins Hives    Has patient had a PCN reaction causing immediate rash, facial/tongue/throat swelling, SOB or lightheadedness with hypotension: Unknown Has patient had a PCN reaction causing severe rash involving mucus membranes or skin necrosis: Unknown Has patient had a PCN reaction that required hospitalization: No Has patient had a PCN reaction occurring within the last 10 years: No If all of the above answers are "NO",  then may proceed with Cephalosporin use.     No medications prior to admission.    Review of Systems  Constitutional: Negative.   Gastrointestinal: Positive for abdominal pain. Negative for constipation, diarrhea, nausea and vomiting.  Genitourinary: Positive for vaginal bleeding. Negative for dysuria and vaginal discharge.   Physical Exam   Blood pressure 131/80, pulse 61, temperature 98.2 F (36.8 C), temperature source Oral, resp. rate 13, height 5\' 8"  (1.727 m), weight 98.8 kg, last menstrual period 05/13/2020, SpO2 96 %.  Physical Exam Vitals and nursing note reviewed. Exam conducted with a chaperone present.  Constitutional:      General: She is not in acute distress.    Appearance: Normal appearance. She is normal weight.  HENT:     Head: Normocephalic and atraumatic.  Pulmonary:     Effort: Pulmonary effort is normal. No respiratory distress.  Abdominal:     General: Abdomen is flat. There is no distension.     Palpations: Abdomen is soft.     Tenderness: There is no abdominal tenderness. There is no guarding.  Genitourinary:    Cervix: Normal.     Uterus: Normal. Not enlarged and not tender.  Comments: No blood. Minimal amount of tan discharge. Cervix closed.  Skin:    General: Skin is warm and dry.  Neurological:     Mental Status: She is alert.  Psychiatric:        Mood and Affect: Mood normal.        Behavior: Behavior normal.     MAU Course  Procedures Results for orders placed or performed during the hospital encounter of 07/23/20 (from the past 24 hour(s))  Urinalysis, Routine w reflex microscopic Urine, Clean Catch     Status: Abnormal   Collection Time: 07/23/20 11:00 AM  Result Value Ref Range   Color, Urine STRAW (A) YELLOW   APPearance CLEAR CLEAR   Specific Gravity, Urine 1.015 1.005 - 1.030   pH 6.0 5.0 - 8.0   Glucose, UA NEGATIVE NEGATIVE mg/dL   Hgb urine dipstick MODERATE (A) NEGATIVE   Bilirubin Urine NEGATIVE NEGATIVE   Ketones,  ur NEGATIVE NEGATIVE mg/dL   Protein, ur NEGATIVE NEGATIVE mg/dL   Nitrite NEGATIVE NEGATIVE   Leukocytes,Ua TRACE (A) NEGATIVE   RBC / HPF 0-5 0 - 5 RBC/hpf   WBC, UA 0-5 0 - 5 WBC/hpf   Bacteria, UA NONE SEEN NONE SEEN   Squamous Epithelial / LPF 0-5 0 - 5  CBC     Status: Abnormal   Collection Time: 07/23/20 12:10 PM  Result Value Ref Range   WBC 8.3 4.0 - 10.5 K/uL   RBC 3.77 (L) 3.87 - 5.11 MIL/uL   Hemoglobin 11.7 (L) 12.0 - 15.0 g/dL   HCT 63.0 (L) 16.0 - 10.9 %   MCV 89.7 80.0 - 100.0 fL   MCH 31.0 26.0 - 34.0 pg   MCHC 34.6 30.0 - 36.0 g/dL   RDW 32.3 55.7 - 32.2 %   Platelets 244 150 - 400 K/uL   nRBC 0.0 0.0 - 0.2 %  ABO/Rh     Status: None   Collection Time: 07/23/20 12:10 PM  Result Value Ref Range   ABO/RH(D) A POS    No rh immune globuloin      NOT A RH IMMUNE GLOBULIN CANDIDATE, PT RH POSITIVE Performed at [facility], [address] [place], [place] [address]   hCG, quantitative, pregnancy     Status: Abnormal   Collection Time: 07/23/20 12:10 PM  Result Value Ref Range   hCG, Beta Chain, Quant, S 285 (H) <5 mIU/mL  Wet prep, genital     Status: Abnormal   Collection Time: 07/23/20 12:13 PM   Specimen: Vaginal  Result Value Ref Range   Yeast Wet Prep HPF POC NONE SEEN NONE SEEN   Trich, Wet Prep NONE SEEN NONE SEEN   Clue Cells Wet Prep HPF POC NONE SEEN NONE SEEN   WBC, Wet Prep HPF POC MANY (A) NONE SEEN   Sperm NONE SEEN    US OB LESS THAN 14 WEEKS WITH OB TRANSVAGINAL  Result Date: 07/23/2020 CLINICAL DATA:  Spotting. Positive urine pregnancy test on 07/19/2020 EXAM: OBSTETRIC <14 WK [place] AND TRANSVAGINAL OB US TECHNIQUE: Both transabdominal and transvaginal ultrasound examinations were performed for complete evaluation of the gestation as well as the maternal uterus, adnexal regions, and pelvic cul-de-sac. Transvaginal technique was performed to assess early pregnancy. COMPARISON:  None. FINDINGS: Intrauterine gestational sac: Tiny cystic  structure in the fundal aspect of the endometrial canal may reflect an early intrauterine gestational sac, although is nonspecific. Yolk sac:  Not Visualized. Embryo:  Not Visualized. Cardiac Activity: Not  Visualized. MSD: 2.9 mm   4 w   6 d Subchorionic hemorrhage:  None visualized. Maternal uterus/adnexae: Unremarkable appearance of the bilateral ovaries. No free fluid within the pelvis. IMPRESSION: Possible early intrauterine gestational sac, but no yolk sac, fetal pole, or cardiac activity yet visualized. Recommend follow-up quantitative B-HCG levels and follow-up US in 14 days to assess viability. This recommendation follows SRU consensus guidelines: Diagnostic Criteria for Nonviable Pregnancy Early in the First Trimester. Malva Limes Med 2013; 564:3329-51. Electronically Signed   By: Duanne Guess D.O.   On: 07/23/2020 13:19   MDM +UPT UA, wet prep, GC/chlamydia, CBC, ABO/Rh, quant hCG, and [place] today to rule out ectopic pregnancy which can be life threatening.   RH positive  Spec exam performed; no blood. Cervix closed. Benign abdominal exam.   Ultrasound shows no IUP identified and no adnexal masses. HCG today is 285. EDD is based on an unsure LMP. Can't exclude miscarriage of ectopic pregnancy. Will bring back in 48 hrs for repeat HCG.   Assessment and Plan   1. Pregnancy of unknown anatomic location   2. Vaginal bleeding in pregnancy, first trimester    -ectopic precautions  -return Sunday for repeat HCG -GC/CT pending  Judeth Horn 07/23/2020, 2:21 PM

## 2020-07-25 ENCOUNTER — Inpatient Hospital Stay (HOSPITAL_COMMUNITY): Payer: Medicaid Other

## 2020-07-25 ENCOUNTER — Inpatient Hospital Stay (HOSPITAL_COMMUNITY)
Admission: AD | Admit: 2020-07-25 | Discharge: 2020-07-25 | Disposition: A | Discharge status: Home / Self Care | Payer: Medicaid Other | Attending: Obstetrics & Gynecology | Admitting: Obstetrics & Gynecology

## 2020-07-25 ENCOUNTER — Other Ambulatory Visit: Payer: Self-pay

## 2020-07-25 DIAGNOSIS — O3680X0 Pregnancy with inconclusive fetal viability, not applicable or unspecified: Secondary | ICD-10-CM | POA: Diagnosis not present

## 2020-07-25 DIAGNOSIS — K5900 Constipation, unspecified: Secondary | ICD-10-CM | POA: Insufficient documentation

## 2020-07-25 DIAGNOSIS — O99611 Diseases of the digestive system complicating pregnancy, first trimester: Secondary | ICD-10-CM | POA: Insufficient documentation

## 2020-07-25 DIAGNOSIS — O0281 Inappropriate change in quantitative human chorionic gonadotropin (hCG) in early pregnancy: Secondary | ICD-10-CM

## 2020-07-25 DIAGNOSIS — Z3A10 10 weeks gestation of pregnancy: Secondary | ICD-10-CM

## 2020-07-25 LAB — HCG, QUANTITATIVE, PREGNANCY: hCG, Beta Chain, Quant, S: 280 m[IU]/mL — ABNORMAL HIGH (ref <5)

## 2020-07-25 NOTE — MAU Note (Signed)
Pt reports to mau for follow up blood work.  Pt denies any changes to bleeding or pain.

## 2020-07-25 NOTE — MAU Provider Note (Signed)
S: 24 y.o. G2P1001 @[redacted]w[redacted]d  by LMP presents to MAU for repeat hcg.  She reports mild lower abdominal cramping, unchanged from her MAU visit on 07/23/20. She denies vaginal bleeding today.  She is having rectal pain and is concerned she has a hemorrhoid. She reports recent constipation.  Her quant hcg on 07/23/20 was 285 and ultrasound showed no visible IUP or ectopic.  HPI  O: BP (!) 115/57 (BP Location: Right Arm)   Pulse 83   Temp 98.1 F (36.7 C) (Oral)   Resp 15   LMP 05/13/2020 (Approximate)   SpO2 97%   VS reviewed, nursing note reviewed,  Constitutional: well developed, well nourished, no distress HEENT: normocephalic CV: normal rate Pulm/chest wall: normal effort Abdomen: soft Neuro: alert and oriented x 3 Skin: warm, dry Psych: affect normal  Results for orders placed or performed during the hospital encounter of 07/25/20 (from the past 24 hour(s))  hCG, quantitative, pregnancy     Status: Abnormal   Collection Time: 07/25/20  3:24 PM  Result Value Ref Range   hCG, Beta Chain, Quant, S 280 (H) <5 mIU/mL    --/--/A POS (03/25 1210)  MDM: Ordered labs/reviewed results.  Quant hcg dropped slightly from 285 to 280.  Pt with mild abdominal cramping, unchanged since previous MAU visit on 07/23/20.  Consult Dr 07/25/20 with inappropriate change in hcg. Debroah Loop repeated today and probably gestational sac seen without yolk sac or fetal pole, similar to previous US.  Findings today could represent a normal early pregnancy, but this is unlikely.  They could also represent spontaneous abortion or ectopic pregnancy which can be life-threatening. Given inappropriate rise in hcg, MTX treatment was offered today vs repeat hcg in 48 hours. Pt desires to repeat hcg in 48 hours. Ectopic precautions were given to the patient with plan to return in 48 hours for repeat quant hcg to evaluate pregnancy development.  A:  1. Inappropriate change in quantitative hCG in early pregnancy   2. Pregnancy of  unknown anatomic location   3. Constipation during pregnancy in first trimester     P: D/C home with ectopic/bleeding precautions F/U with hcg in 48 hours in MAU Return to MAU as needed for emergencies  LEFTWICH-KIRBY, LISA, CNM 2:18 PM

## 2020-07-26 LAB — GC/CHLAMYDIA PROBE AMP (~~LOC~~) NOT AT ARMC
Chlamydia: NEGATIVE
Comment: NEGATIVE
Comment: NORMAL
Neisseria Gonorrhea: NEGATIVE

## 2020-07-27 ENCOUNTER — Inpatient Hospital Stay (HOSPITAL_COMMUNITY)
Admission: AD | Admit: 2020-07-27 | Discharge: 2020-07-27 | Disposition: A | Discharge status: Home / Self Care | Payer: Medicaid Other | Attending: Obstetrics and Gynecology | Admitting: Obstetrics and Gynecology

## 2020-07-27 ENCOUNTER — Telehealth: Payer: Self-pay | Admitting: Obstetrics and Gynecology

## 2020-07-27 ENCOUNTER — Other Ambulatory Visit: Payer: Self-pay

## 2020-07-27 DIAGNOSIS — O3680X0 Pregnancy with inconclusive fetal viability, not applicable or unspecified: Secondary | ICD-10-CM | POA: Diagnosis not present

## 2020-07-27 DIAGNOSIS — O209 Hemorrhage in early pregnancy, unspecified: Secondary | ICD-10-CM | POA: Insufficient documentation

## 2020-07-27 DIAGNOSIS — Z6790 Unspecified blood type, Rh positive: Secondary | ICD-10-CM

## 2020-07-27 DIAGNOSIS — Z3A10 10 weeks gestation of pregnancy: Secondary | ICD-10-CM | POA: Diagnosis not present

## 2020-07-27 LAB — COMPREHENSIVE METABOLIC PANEL
ALT: 24 U/L (ref 0–44)
AST: 18 U/L (ref 15–41)
Albumin: 3.8 g/dL (ref 3.5–5.0)
Alkaline Phosphatase: 62 U/L (ref 38–126)
Anion gap: 6 (ref 5–15)
BUN: 14 mg/dL (ref 6–20)
CO2: 25 mmol/L (ref 22–32)
Calcium: 10.1 mg/dL (ref 8.9–10.3)
Chloride: 106 mmol/L (ref 98–111)
Creatinine, Ser: 0.84 mg/dL (ref 0.44–1.00)
GFR, Estimated: >60 mL/min (ref >60)
Glucose, Bld: 99 mg/dL (ref 70–99)
Potassium: 4 mmol/L (ref 3.5–5.1)
Sodium: 137 mmol/L (ref 135–145)
Total Bilirubin: 0.3 mg/dL (ref 0.3–1.2)
Total Protein: 7.4 g/dL (ref 6.5–8.1)

## 2020-07-27 LAB — CBC
HCT: 36.4 % (ref 36.0–46.0)
Hemoglobin: 12.2 g/dL (ref 12.0–15.0)
MCH: 30.6 pg (ref 26.0–34.0)
MCHC: 33.5 g/dL (ref 30.0–36.0)
MCV: 91.2 fL (ref 80.0–100.0)
Platelets: 265 10*3/uL (ref 150–400)
RBC: 3.99 MIL/uL (ref 3.87–5.11)
RDW: 13.5 % (ref 11.5–15.5)
WBC: 10.9 10*3/uL — ABNORMAL HIGH (ref 4.0–10.5)
nRBC: 0 % (ref 0.0–0.2)

## 2020-07-27 LAB — HCG, QUANTITATIVE, PREGNANCY: hCG, Beta Chain, Quant, S: 272 m[IU]/mL — ABNORMAL HIGH (ref <5)

## 2020-07-27 NOTE — Telephone Encounter (Signed)
Spoke with Marisa Page about needing to move her appointment to April 5th. She agreered to the appointment.

## 2020-07-27 NOTE — MAU Note (Signed)
Patient reports to MAU for follow-up HCG. Reports that yesterday she went to the restroom and an irregular shaped clot came out, which she brought with her. Denies any pain and reports lighter bleeding than previously.   Today's Vitals   07/27/20 1920  BP: 114/69  Pulse: 93  Resp: 14  Temp: 98.1 F (36.7 C)  TempSrc: Oral  SpO2: 99%  Weight: 96.6 kg  Height: 5\' 8"  (1.727 m)

## 2020-07-27 NOTE — MAU Provider Note (Signed)
Subjective:  Marisa Page is a 25 y.o. G2P1001 at [redacted]w[redacted]d who presents today for FU BHCG. She was seen on 07/23/2020 with hCG 285 and no IUP on [place]. Patient was seen again on 07/25/2020 with hCG 280 and again no IUP on [place]. She reports vaginal bleeding but states it is significantly less. She denies abdominal or pelvic pain. Patient also reports she passed something at home that she thinks is tissue and she brought it to MAU for evaluation. Tissue sent to pathology. Note made that patient might need MTX  Today if hCG continues to be abnormal.  Objective:  Physical Exam  Nursing note and vitals reviewed.  Patient Vitals for the past 24 hrs:  BP Temp Temp src Pulse Resp SpO2 Height Weight  07/27/20 1920 114/69 98.1 F (36.7 C) Oral 93 14 99 % 5\' 8"  (1.727 m) 96.6 kg   Constitutional: She is oriented to person, place, and time. She appears well-developed and well-nourished. No distress.  HENT:  Head: Normocephalic.  Cardiovascular: Normal rate.  Respiratory: Effort normal.  GI: Soft. There is no tenderness.  Neurological: She is alert and oriented to person, place, and time. Skin: Skin is warm and dry.  Psychiatric: She has a normal mood and affect.   Results for orders placed or performed during the hospital encounter of 07/27/20 (from the past 24 hour(s))  CBC     Status: Abnormal   Collection Time: 07/27/20  7:45 PM  Result Value Ref Range   WBC 10.9 (H) 4.0 - 10.5 K/uL   RBC 3.99 3.87 - 5.11 MIL/uL   Hemoglobin 12.2 12.0 - 15.0 g/dL   HCT 07/29/20 75.6 - 43.3 %   MCV 91.2 80.0 - 100.0 fL   MCH 30.6 26.0 - 34.0 pg   MCHC 33.5 30.0 - 36.0 g/dL   RDW 29.5 18.8 - 41.6 %   Platelets 265 150 - 400 K/uL   nRBC 0.0 0.0 - 0.2 %  Comprehensive metabolic panel     Status: None   Collection Time: 07/27/20  7:45 PM  Result Value Ref Range   Sodium 137 135 - 145 mmol/L   Potassium 4.0 3.5 - 5.1 mmol/L   Chloride 106 98 - 111 mmol/L   CO2 25 22 - 32 mmol/L   Glucose, Bld 99 70 - 99  mg/dL   BUN 14 6 - 20 mg/dL   Creatinine, Ser 07/29/20 0.44 - 1.00 mg/dL   Calcium 3.01 8.9 - 60.1 mg/dL   Total Protein 7.4 6.5 - 8.1 g/dL   Albumin 3.8 3.5 - 5.0 g/dL   AST 18 15 - 41 U/L   ALT 24 0 - 44 U/L   Alkaline Phosphatase 62 38 - 126 U/L   Total Bilirubin 0.3 0.3 - 1.2 mg/dL   GFR, Estimated 09.3 >23 mL/min   Anion gap 6 5 - 15  hCG, quantitative, pregnancy     Status: Abnormal   Collection Time: 07/27/20  8:22 PM  Result Value Ref Range   hCG, Beta Chain, Quant, S 272 (H) <5 mIU/mL    Assessment/Plan: Pregnancy of unknown location HCG 285 --> 280 --> 272 HCG did not rise appropriately Consulted with Dr. 07/29/20 on slowly decreasing hCG. Per Dr. Vergie Living, plan to schedule patient for STAT hCG in 48 hours. However, if pathology shows chorionic villi, then we know it is not an ectopic and hCG can be cancelled. Per Dr. Vergie Living, no repeat Vergie Living is indicated tonight in MAU. FU in 2 days  for: repeat hCG.  Pt discharged to home in stable condition  Giles Currie, Odie Sera, NP  9:36 PM 07/27/2020

## 2020-07-27 NOTE — Discharge Instructions (Signed)
Miscarriage A miscarriage is the loss of pregnancy before the 20th week. Most miscarriages happen during the first 3 months of pregnancy. Sometimes, a miscarriage can happen before a woman knows that she is pregnant. Having a miscarriage can be an emotional experience. If you have had a miscarriage, talk with your health care provider about any questions you may have about the loss of your baby, the grieving process, and your plans for future pregnancy. What are the causes? Many times, the cause of a miscarriage is not known. What increases the risk? The following factors may make a pregnant woman more likely to have a miscarriage: Certain medical conditions  Conditions that affect the hormone balance in the body, such as thyroid disease or polycystic ovary syndrome.  Diabetes.  Autoimmune disorders.  Infections.  Bleeding disorders.  Obesity. Lifestyle factors  Using products with tobacco or nicotine in them or being exposed to tobacco smoke.  Having alcohol.  Having large amounts of caffeine.  Recreational drug use. Problems with reproductive organs or structures  Cervical insufficiency. This is when the lowest part of the uterus (cervix) opens and thins before pregnancy is at term.  Having a condition called Asherman syndrome. This syndrome causes scarring in the uterus or causes the uterus to be abnormal in structure.  Fibrous growths, called fibroids, in the uterus.  Congenital abnormalities. These problems are present at birth.  Infection of the cervix or uterus. Personal or medical history  Injury (trauma).  Having had a miscarriage before.  Being younger than age 18 or older than age 35.  Exposure to harmful substances in the environment. This may include radiation or heavy metals, such as lead.  Use of certain medicines. What are the signs or symptoms? Symptoms of this condition include:  Vaginal bleeding or spotting, with or without cramps or  pain.  Pain or cramping in the abdomen or lower back.  Fluid or tissue coming out of the vagina. How is this diagnosed? This condition may be diagnosed based on:  A physical exam.  Ultrasound.  Lab tests, such as blood tests, urine tests, or swabs for infection. How is this treated? Treatment for a miscarriage is sometimes not needed if all the pregnancy tissue that was in the uterus comes out on its own, and there are no other problems such as infection or heavy bleeding. In other cases, this condition may be treated with:  Dilation and curettage (D&C). In this procedure, the cervix is stretched open and any remaining pregnancy tissue is removed from the lining of the uterus (endometrium).  Medicines. These may include: ? Antibiotic medicine, to treat infection. ? Medicine to help any remaining pregnancy tissue come out of the body. ? Medicine to reduce (contract) the size of the uterus. These medicines may be given if there is a lot of bleeding. If you have Rh-negative blood, you may be given an injection of a medicine called Rho(D) immune globulin. This medicine helps prevent problems with future pregnancies. Follow these instructions at home: Medicines  Take over-the-counter and prescription medicines only as told by your health care provider.  If you were prescribed antibiotic medicine, take it as told by your health care provider. Do not stop taking the antibiotic even if you start to feel better. Activity  Rest as told by your health care provider. Ask your health care provider what activities are safe for you.  Have someone help with home and family responsibilities during this time. General instructions  Monitor how much tissue   or blood clot material comes out of the vagina.  Do not have sex, douche, or put anything, such as tampons, in your vagina until your health care provider says it is okay.  To help you and your partner with the grieving process, talk with your  health care provider or get counseling.  When you are ready, meet with your health care provider to discuss any important steps you should take for your health. Also, discuss steps you should take to have a healthy pregnancy in the future.  Keep all follow-up visits. This is important.   Where to find more information  The [employer] of [employer]ians and [employer]necologists: acog.org  [place] Department of Health and Cytogeneticist of Women's Health: http://hoffman.com/ Contact a health care provider if:  You have a fever or chills.  There is bad-smelling fluid coming from the vagina.  You have more bleeding instead of less.  Tissue or blood clots come out of your vagina. Get help right away if:  You have severe cramps or pain in your back or abdomen.  Heavy bleeding soaks through 2 large sanitary pads an hour for more than 2 hours.  You become light-headed or weak.  You faint.  You feel sad, and your sadness takes over your thoughts.  You think about hurting yourself. If you ever feel like you may hurt yourself or others, or have thoughts about taking your own life, get help right away. Go to your nearest emergency department or:  Call your local emergency services (911 in the [place]  Call a suicide crisis helpline, such as the National Suicide Prevention Lifeline at (573)013-6954. This is open 24 hours a day in the [place]  Text the Crisis Text Line at (571)544-8576 (in the [place] Summary  Most miscarriages happen in the first 3 months of pregnancy. Sometimes miscarriage happens before a woman knows that she is pregnant.  Follow instructions from your health care provider about medicines and activity.  To help you and your partner with grieving, talk with your health care provider or get counseling.  Keep all follow-up visits. This information is not intended to replace advice given to you by your health care provider. Make sure you discuss any questions you  have with your health care provider. Document Revised: 10/17/2019 Document Reviewed: 10/17/2019 Elsevier Patient Education  2021 Elsevier Inc.        Ectopic Pregnancy  An ectopic pregnancy happens when a fertilized egg attaches (implants) outside the uterus. In a normal pregnancy, a fertilized egg implants in the uterus. An ectopic pregnancy cannot develop into a healthy baby. Most ectopic pregnancies occur in one of the fallopian tubes, which is where an egg travels from an ovary to get to the uterus. This is called a tubal pregnancy. An ectopic pregnancy can also happen on an ovary, on the cervix, or in the abdomen. When a fertilized egg implants on tissue outside the uterus and begins to grow, it may cause the tissue to tear or burst. This is known as a ruptured ectopic pregnancy. The tear or burst causes internal bleeding. This may cause intense pain in the abdomen. An ectopic pregnancy is a medical emergency and can be life-threatening. What are the causes? The most common cause of this condition is damage to one of the fallopian tubes. A fallopian tube may be narrowed or blocked, and that stops the fertilized egg from reaching the uterus. Sometimes, the cause of this condition is not known. What increases the risk? The  following factors may make you more likely to develop this condition:  Having gone through infertility treatment before.  Having had an ectopic pregnancy before.  Having had surgery to have the fallopian tubes tied.  Becoming pregnant while using an intrauterine device for birth control.  Taking birth control pills before the age of 42. Other risk factors include:  Smoking.  Alcohol use.  History of DES exposure. DES is a medicine that was used until 1971 and affected babies whose mothers took the medicine. What are the signs or symptoms? Common symptoms of this condition include:  Missing a menstrual period.  Nausea or tiredness.  Tender  breasts.  Other normal pregnancy symptoms. Other symptoms may include:  Pain during sex.  Vaginal bleeding or spotting.  Cramping or pain in the lower abdomen.  A fast heartbeat, low blood pressure, and sweating.  Pain or increased pressure while having a bowel movement. Symptoms of a ruptured ectopic pregnancy and internal bleeding may include:  Sudden, severe pain in the abdomen.  Dizziness, weakness, feeling light-headed, or fainting.  Pain in the shoulder or neck area. How is this diagnosed? This condition is diagnosed by:  A blood test to check for the pregnancy hormone.  A pelvic exam to find painful areas or a mass in the abdomen.  Ultrasound. A probe is inserted into the vagina to see if there is a pregnancy in or outside the uterus.  Taking a sample of tissue from the uterus.  Surgery to look closely at the fallopian tubes through an incision in the abdomen. How is this treated? This condition is usually treated with medicine or surgery. Sometimes, ectopic pregnancies can resolve on their own, under close monitoring by your health care provider. Medicine A medicine called methotrexate may be given to cause the pregnancy tissue to be absorbed. The medicine may be given if:  The diagnosis is made early, with no signs of active bleeding.  The fallopian tube has not torn or burst. You will need blood tests to make sure the medicine is working. It may take 4-6 weeks for the pregnancy tissues to be absorbed. Surgery Surgery may be performed to:  Remove the pregnancy tissue.  Stop internal bleeding.  Remove part or all of the fallopian tube.  Remove the uterus. This is rare. After surgery, you may need to have blood tests to make sure the surgery worked. Follow these instructions at home: Medicines  Take over-the-counter and prescription medicines only as told by your health care provider.  Ask your health care provider if the medicine prescribed to  you: ? Requires you to avoid driving or using machinery. ? Can cause constipation. You may need to take these actions to prevent or treat constipation:  Drink enough fluid to keep your urine pale yellow.  Take over-the-counter or prescription medicines.  Eat foods that are high in fiber, such as beans, whole grains, and fresh fruits and vegetables.  Limit foods that are high in fat and processed sugars, such as fried or sweet foods. General instructions  Rest or limit your activity, if told by your health care provider.  Do not have sex or put anything in your vagina, such as tampons or douches, for 6 weeks or until your health care provider says it is safe.  Do not lift anything that is heavier than 10 lb (4.5 kg), or the limit that you are told, until your health care provider says that it is safe.  Return to your normal activities as  told by your health care provider. Ask your health care provider what activities are safe for you.  Keep all follow-up visits. This is important. Contact a health care provider if:  You have a fever or chills.  You have nausea and vomiting. Get help right away if:  Your pain gets worse or is not relieved by medicine.  You feel dizzy or weak.  You feel light-headed or you faint.  You have sudden, severe pain in your abdomen.  You have sudden pain in the shoulder or neck area. Summary  An ectopic pregnancy happens when a fertilized egg implants outside the uterus. Most ectopic pregnancies occur in one of the fallopian tubes.  An ectopic pregnancy is a medical emergency and can be life-threatening.  The most common cause of this condition is damage to one of the fallopian tubes.  This condition is usually treated with medicine or surgery. Some ectopic pregnancies resolve on their own, under close monitoring by your health care provider. This information is not intended to replace advice given to you by your health care provider. Make sure  you discuss any questions you have with your health care provider. Document Revised: 07/29/2019 Document Reviewed: 07/29/2019 Elsevier Patient Education  2021 Elsevier Inc.        Ruptured Ectopic Pregnancy  An ectopic pregnancy happens when a fertilized egg attaches (implants) outside the uterus, usually in one of the fallopian tubes. This is where an egg travels from an ovary to get to the uterus. An ectopic pregnancy cannot develop into a healthy baby. When a fertilized egg implants on tissue outside the uterus and begins to grow, it may cause the tissue to tear or burst. This is known as a ruptured ectopic pregnancy. The tear or burst causes internal bleeding. This may cause intense pain in the abdomen. A ruptured ectopic pregnancy can affect the ability to have children (fertility), depending on damage it causes to the reproductive organs. A ruptured ectopic pregnancy is a medical emergency. If not treated right away, it can lead to blood loss or shock, and it can be life-threatening. What are the causes? An ectopic pregnancy ruptures because it is growing in a spot that is not meant to expand and support the growth of a pregnancy. What increases the risk? You are more likely to have a ruptured ectopic pregnancy if:  You have an ectopic pregnancy, but you do not have any symptoms and the pregnancy is not found early enough to treat it before it ruptures.  You have nonsurgical treatment of an ectopic pregnancy.  You choose not to have any treatment for an ectopic pregnancy. What are the signs or symptoms? Symptoms of a ruptured ectopic pregnancy and internal bleeding may include:  Sudden, severe pain in the abdomen.  Feeling dizzy, weak, or light-headed.  Fainting.  Pain in the shoulder or neck area. How is this diagnosed? This condition is diagnosed based on your medical history, symptoms, a physical exam, and testing. Testing may include an ultrasound and blood tests. How  is this treated? This condition is treated with IV fluids and emergency surgery to remove the ectopic pregnancy and repair the area where the rupture occurred. If a lot of blood was lost, donated blood may be needed (blood transfusion). You may receive a Rho (D) immune globulin shot if you are Rh negative and your baby's father is Rh positive, or if the Rh type of the father is unknown. This shot is given to prevent Rh problems in  future pregnancies. You may receive other medicines. Summary  An ectopic pregnancy happens when a fertilized egg attaches (implants) outside the uterus, usually in one of the fallopian tubes. When a fertilized egg implants on tissue outside the uterus and begins to grow, it may cause the tissue to tear or burst. This is known as a ruptured ectopic pregnancy.  A ruptured ectopic pregnancy is a medical emergency. If not treated right away, it can lead to blood loss or shock, and it can be life-threatening.  This condition is treated with IV fluids and emergency surgery to remove the ectopic pregnancy and repair the area where the rupture occurred. If a lot of blood was lost, donated blood may be needed. This information is not intended to replace advice given to you by your health care provider. Make sure you discuss any questions you have with your health care provider. Document Revised: 07/29/2019 Document Reviewed: 07/29/2019 Elsevier Patient Education  2021 [employer].

## 2020-07-29 LAB — SURGICAL PATHOLOGY

## 2020-07-30 ENCOUNTER — Other Ambulatory Visit (INDEPENDENT_AMBULATORY_CARE_PROVIDER_SITE_OTHER): Payer: Medicaid Other

## 2020-07-30 ENCOUNTER — Other Ambulatory Visit: Payer: Self-pay

## 2020-07-30 DIAGNOSIS — O26851 Spotting complicating pregnancy, first trimester: Secondary | ICD-10-CM

## 2020-07-30 DIAGNOSIS — O3680X0 Pregnancy with inconclusive fetal viability, not applicable or unspecified: Secondary | ICD-10-CM

## 2020-07-30 LAB — BETA HCG QUANT (REF LAB): hCG Quant: 34 m[IU]/mL

## 2020-07-30 NOTE — Progress Notes (Signed)
History   Chief Complaint: Marisa Page is  25 y.o. G2P1001 Patient's last menstrual period was 05/13/2020 (approximate). Patient is here for follow up of quantitative HCG and ongoing surveillance of pregnancy status. She is [redacted]w[redacted]d weeks gestation by LMP.    Since her last visit, the patient is without new complaint.   The patient reports bleeding as spotting, denies pain.  Her previous Quantitative HCG values are: 07/30/20 - 34 07/27/20 - 272 07/25/20 - 280 07/23/20 - 285  US OB TV on 07/25/20 IMPRESSION: Probable early intrauterine gestational sac, but no yolk sac, fetal pole, or cardiac activity yet visualized. Recommend follow-up quantitative B-HCG levels and follow-up US in 14 days to assess viability. This recommendation follows SRU consensus guidelines: Diagnostic Criteria for Nonviable Pregnancy Early in the First Trimester.   Physical Exam  Last menstrual period 05/13/2020.  Assessment:  101w1d weeks gestation here for ongoing surveillance of pregnancy.  Plan: Will contact patient today with a plan once quant results have been reviewed by the provider.  Update - HCG quant is dropping as it should  Weekly HCG quants until negative

## 2020-07-30 NOTE — Progress Notes (Signed)
Patient was assessed and managed by nursing staff during this encounter. I have reviewed the chart and agree with the documentation and plan. I have also made any necessary editorial changes.  Jaynie Collins, MD 07/30/2020 10:37 AM

## 2020-08-02 ENCOUNTER — Telehealth: Payer: Medicaid Other

## 2020-08-03 ENCOUNTER — Telehealth: Payer: Medicaid Other

## 2020-08-06 ENCOUNTER — Other Ambulatory Visit: Payer: Self-pay

## 2020-08-06 ENCOUNTER — Other Ambulatory Visit: Payer: Medicaid Other

## 2020-08-06 DIAGNOSIS — O3680X0 Pregnancy with inconclusive fetal viability, not applicable or unspecified: Secondary | ICD-10-CM

## 2020-08-07 LAB — BETA HCG QUANT (REF LAB): hCG Quant: 1 m[IU]/mL

## 2020-08-09 ENCOUNTER — Ambulatory Visit (INDEPENDENT_AMBULATORY_CARE_PROVIDER_SITE_OTHER): Payer: Medicaid Other | Admitting: Licensed Clinical Social Worker

## 2020-08-09 DIAGNOSIS — F4321 Adjustment disorder with depressed mood: Secondary | ICD-10-CM | POA: Diagnosis not present

## 2020-08-11 NOTE — BH Specialist Note (Addendum)
Integrated Behavioral Health via Telemedicine Visit  08/11/2020 Marisa Page [id number]  Number of Integrated Behavioral Health visits: 1 Session Start time: 3:00pm Session End time: 3:17pm Total time: 17 mins via mychart   Referring Provider: hospital discharge  Patient/Family location: home  [facility] Provider location: [place] All persons participating in visit: Pt N Marisa Page and LCSWA A. Jari Carollo Types of Service: Individual psychotherapy and Video visit  I connected with Marisa Page and/or Marisa Page's n/a via  Telephone or Video Enabled Telemedicine Application  (Video is Caregility application) and verified that I am speaking with the correct person using two identifiers. Discussed confidentiality: Yes   I discussed the limitations of telemedicine and the availability of in person appointments.  Discussed there is a possibility of technology failure and discussed alternative modes of communication if that failure occurs.  I discussed that engaging in this telemedicine visit, they consent to the provision of behavioral healthcare and the services will be billed under their insurance.  Patient and/or legal guardian expressed understanding and consented to Telemedicine visit: Yes   Presenting Concerns: Patient and/or family reports the following symptoms/concerns: mood check  Duration of problem: 2 weeks ; Severity of problem: mild  Patient and/or Family's Strengths/Protective Factors: Concrete supports in place (healthy food, safe environments, etc.)  Goals Addressed: Patient will: 1.  Reduce symptoms of: depression  2.  Increase knowledge and/or ability of: coping skills  3.  Demonstrate ability to: Increase healthy adjustment to current life circumstances  Progress towards Goals: Ongoing  Interventions: Interventions utilized:  Supportive Counseling Standardized Assessments completed: n/a  Assessment: Patient currently  experiencing grief associated with pregnancy loss   Patient may benefit from support group  Plan: 1. Follow up with behavioral health clinician on :prn 2. Behavioral recommendations: engage in support group with [facility] 3. Referral(s): Integrated [employer] (In Clinic)  I discussed the assessment and treatment plan with the patient and/or parent/guardian. They were provided an opportunity to ask questions and all were answered. They agreed with the plan and demonstrated an understanding of the instructions.   They were advised to call back or seek an in-person evaluation if the symptoms worsen or if the condition fails to improve as anticipated.  Gwyndolyn Saxon, LCSW

## 2020-08-12 NOTE — Addendum Note (Signed)
Addended by: Gwyndolyn Saxon on: 08/12/2020 10:52 AM   Modules accepted: Level of Service

## 2021-01-17 ENCOUNTER — Other Ambulatory Visit: Payer: Self-pay

## 2021-01-17 ENCOUNTER — Ambulatory Visit (INDEPENDENT_AMBULATORY_CARE_PROVIDER_SITE_OTHER): Payer: Medicaid Other

## 2021-01-17 ENCOUNTER — Other Ambulatory Visit (HOSPITAL_COMMUNITY)
Admission: RE | Admit: 2021-01-17 | Discharge: 2021-01-17 | Disposition: A | Discharge status: Home / Self Care | Payer: Medicaid Other | Source: Ambulatory Visit | Attending: Obstetrics and Gynecology | Admitting: Obstetrics and Gynecology

## 2021-01-17 VITALS — BP 129/76 | HR 69 | Temp 97.9°F | Ht 68.0 in | Wt 220.6 lb

## 2021-01-17 DIAGNOSIS — Z3201 Encounter for pregnancy test, result positive: Secondary | ICD-10-CM

## 2021-01-17 DIAGNOSIS — Z3A01 Less than 8 weeks gestation of pregnancy: Secondary | ICD-10-CM | POA: Insufficient documentation

## 2021-01-17 DIAGNOSIS — N926 Irregular menstruation, unspecified: Secondary | ICD-10-CM | POA: Diagnosis present

## 2021-01-17 DIAGNOSIS — Z3480 Encounter for supervision of other normal pregnancy, unspecified trimester: Secondary | ICD-10-CM

## 2021-01-17 DIAGNOSIS — O09299 Supervision of pregnancy with other poor reproductive or obstetric history, unspecified trimester: Secondary | ICD-10-CM

## 2021-01-17 LAB — POCT URINE PREGNANCY: Preg Test, Ur: POSITIVE — AB

## 2021-01-17 NOTE — Progress Notes (Addendum)
     Location: [facility]  Patient: Marisa Page Provider: Edd Arbour, CNM  PRENATAL INTAKE SUMMARY  Ms. Tawny Hopping presents today New OB Nurse Interview.  OB History     Gravida  3   Para  1   Term  1   Preterm  0   AB  1   Living  1      SAB  1   IAB  0   Ectopic  0   Multiple      Live Births  1          I have reviewed the patient's medical, obstetrical, social, and family histories, medications, and available lab results.  SUBJECTIVE She has complaints of discharge Clear   OBJECTIVE Initial Nurse interview for history/labs (New OB)  EDD: 09/16/2021 GA: [redacted]w[redacted]d G2P1011 Fht- Not accessed due to GA   GENERAL APPEARANCE: alert, well appearing, in no apparent distress, oriented to person, place and time   ASSESSMENT Normal pregnancy Positive Urine Pregnancy Test   PLAN Prenatal care:  Clinic [facility] Pnl/HIV/Hep C OB Urine Culture A1C Self Vaginal Swab  Panorama and Horizon done   Follow Up Instructions:   I discussed the assessment and treatment plan with the patient. The patient was provided an opportunity to ask questions and all were answered. The patient agreed with the plan and demonstrated an understanding of the instructions.   The patient was advised to call back or seek an in-person evaluation if the symptoms worsen or if the condition fails to improve as anticipated.  I provided 40 minutes of  face-to-face time during this encounter.  Gearldine Shown, CMA

## 2021-01-17 NOTE — Progress Notes (Deleted)
[name] presents today for UPT. She has no unusual complaints. LMP:    OBJECTIVE: Appears well, in no apparent distress.  OB History     Gravida  3   Para  1   Term  1   Preterm  0   AB  1   Living  1      SAB  1   IAB  0   Ectopic  0   Multiple      Live Births  1          Home UPT Result: In-Office UPT result: I have reviewed the patient's medical, obstetrical, social, and family histories, and medications.   ASSESSMENT: Positive pregnancy test  PLAN Prenatal care to be completed at:

## 2021-01-18 LAB — OBSTETRIC PANEL, INCLUDING HIV
Antibody Screen: NEGATIVE
Basophils Absolute: 0 10*3/uL (ref 0.0–0.2)
Basos: 0 %
EOS (ABSOLUTE): 0.1 10*3/uL (ref 0.0–0.4)
Eos: 1 %
HIV Screen 4th Generation wRfx: NONREACTIVE
Hematocrit: 37.8 % (ref 34.0–46.6)
Hemoglobin: 12.9 g/dL (ref 11.1–15.9)
Hepatitis B Surface Ag: NEGATIVE
Immature Grans (Abs): 0 10*3/uL (ref 0.0–0.1)
Immature Granulocytes: 0 %
Lymphocytes Absolute: 5 10*3/uL — ABNORMAL HIGH (ref 0.7–3.1)
Lymphs: 50 %
MCH: 30.6 pg (ref 26.6–33.0)
MCHC: 34.1 g/dL (ref 31.5–35.7)
MCV: 90 fL (ref 79–97)
Monocytes Absolute: 0.6 10*3/uL (ref 0.1–0.9)
Monocytes: 6 %
Neutrophils Absolute: 4.4 10*3/uL (ref 1.4–7.0)
Neutrophils: 43 %
Platelets: 267 10*3/uL (ref 150–450)
RBC: 4.21 x10E6/uL (ref 3.77–5.28)
RDW: 12.6 % (ref 11.7–15.4)
RPR Ser Ql: NONREACTIVE
Rh Factor: POSITIVE
Rubella Antibodies, IGG: 8.63 index (ref >0.99)
WBC: 10.2 10*3/uL (ref 3.4–10.8)

## 2021-01-18 LAB — CERVICOVAGINAL ANCILLARY ONLY
Bacterial Vaginitis (gardnerella): NEGATIVE
Candida Glabrata: NEGATIVE
Candida Vaginitis: NEGATIVE
Comment: NEGATIVE
Comment: NEGATIVE
Comment: NEGATIVE
Comment: NEGATIVE
Trichomonas: NEGATIVE

## 2021-01-18 LAB — HEMOGLOBIN A1C
Est. average glucose Bld gHb Est-mCnc: 108 mg/dL
Hgb A1c MFr Bld: 5.4 % (ref 4.8–5.6)

## 2021-01-18 LAB — HEPATITIS C ANTIBODY: Hep C Virus Ab: <0.1 s/co ratio (ref 0.0–0.9)

## 2021-01-19 ENCOUNTER — Ambulatory Visit: Payer: Medicaid Other | Admitting: Certified Nurse Midwife

## 2021-01-21 LAB — URINE CULTURE, OB REFLEX

## 2021-01-21 LAB — CULTURE, OB URINE

## 2021-01-24 ENCOUNTER — Other Ambulatory Visit: Payer: Self-pay | Admitting: Certified Nurse Midwife

## 2021-01-24 ENCOUNTER — Telehealth: Payer: Self-pay | Admitting: Certified Nurse Midwife

## 2021-01-24 DIAGNOSIS — O2341 Unspecified infection of urinary tract in pregnancy, first trimester: Secondary | ICD-10-CM

## 2021-01-24 MED ORDER — CEFADROXIL 500 MG PO CAPS: 500.0000 mg | ORAL_CAPSULE | Freq: Two times a day (BID) | ORAL | 0 refills | Status: DC

## 2021-01-24 NOTE — Telephone Encounter (Signed)
Received a call from patient stating she has had COVID for the past 3 days. Today she is complaining of having cramps. She wants to know what she should do.

## 2021-01-26 ENCOUNTER — Encounter (HOSPITAL_COMMUNITY): Payer: Self-pay | Admitting: Obstetrics & Gynecology

## 2021-01-26 ENCOUNTER — Inpatient Hospital Stay (HOSPITAL_COMMUNITY)
Admission: AD | Admit: 2021-01-26 | Discharge: 2021-01-27 | Disposition: A | Discharge status: Home / Self Care | Payer: Medicaid Other | Attending: Obstetrics & Gynecology | Admitting: Obstetrics & Gynecology

## 2021-01-26 ENCOUNTER — Other Ambulatory Visit: Payer: Self-pay

## 2021-01-26 ENCOUNTER — Inpatient Hospital Stay (HOSPITAL_COMMUNITY): Payer: Medicaid Other

## 2021-01-26 DIAGNOSIS — O209 Hemorrhage in early pregnancy, unspecified: Secondary | ICD-10-CM

## 2021-01-26 DIAGNOSIS — O98511 Other viral diseases complicating pregnancy, first trimester: Secondary | ICD-10-CM | POA: Diagnosis not present

## 2021-01-26 DIAGNOSIS — Z3A01 Less than 8 weeks gestation of pregnancy: Secondary | ICD-10-CM | POA: Diagnosis not present

## 2021-01-26 DIAGNOSIS — N3001 Acute cystitis with hematuria: Secondary | ICD-10-CM

## 2021-01-26 DIAGNOSIS — U071 COVID-19: Secondary | ICD-10-CM | POA: Diagnosis not present

## 2021-01-26 DIAGNOSIS — O418X10 Other specified disorders of amniotic fluid and membranes, first trimester, not applicable or unspecified: Secondary | ICD-10-CM

## 2021-01-26 DIAGNOSIS — N390 Urinary tract infection, site not specified: Secondary | ICD-10-CM | POA: Insufficient documentation

## 2021-01-26 DIAGNOSIS — Z880 Allergy status to penicillin: Secondary | ICD-10-CM | POA: Diagnosis not present

## 2021-01-26 DIAGNOSIS — O3680X0 Pregnancy with inconclusive fetal viability, not applicable or unspecified: Secondary | ICD-10-CM | POA: Insufficient documentation

## 2021-01-26 DIAGNOSIS — O2341 Unspecified infection of urinary tract in pregnancy, first trimester: Secondary | ICD-10-CM | POA: Diagnosis not present

## 2021-01-26 DIAGNOSIS — O208 Other hemorrhage in early pregnancy: Secondary | ICD-10-CM | POA: Diagnosis not present

## 2021-01-26 LAB — COMPREHENSIVE METABOLIC PANEL
ALT: 18 U/L (ref 0–44)
AST: 19 U/L (ref 15–41)
Albumin: 3.7 g/dL (ref 3.5–5.0)
Alkaline Phosphatase: 58 U/L (ref 38–126)
Anion gap: 8 (ref 5–15)
BUN: 11 mg/dL (ref 6–20)
CO2: 24 mmol/L (ref 22–32)
Calcium: 10.6 mg/dL — ABNORMAL HIGH (ref 8.9–10.3)
Chloride: 102 mmol/L (ref 98–111)
Creatinine, Ser: 0.81 mg/dL (ref 0.44–1.00)
GFR, Estimated: >60 mL/min (ref >60)
Glucose, Bld: 98 mg/dL (ref 70–99)
Potassium: 3.6 mmol/L (ref 3.5–5.1)
Sodium: 134 mmol/L — ABNORMAL LOW (ref 135–145)
Total Bilirubin: 0.7 mg/dL (ref 0.3–1.2)
Total Protein: 7.3 g/dL (ref 6.5–8.1)

## 2021-01-26 LAB — CBC
HCT: 35.3 % — ABNORMAL LOW (ref 36.0–46.0)
Hemoglobin: 11.7 g/dL — ABNORMAL LOW (ref 12.0–15.0)
MCH: 29.8 pg (ref 26.0–34.0)
MCHC: 33.1 g/dL (ref 30.0–36.0)
MCV: 90.1 fL (ref 80.0–100.0)
Platelets: 224 10*3/uL (ref 150–400)
RBC: 3.92 MIL/uL (ref 3.87–5.11)
RDW: 13 % (ref 11.5–15.5)
WBC: 9.6 10*3/uL (ref 4.0–10.5)
nRBC: 0 % (ref 0.0–0.2)

## 2021-01-26 NOTE — MAU Note (Signed)
Began bleeding approximately 30 minutes ago. States a pinkish color. Denies intercourse in last 24 hours.

## 2021-01-26 NOTE — MAU Provider Note (Addendum)
Chief Complaint: Vaginal Bleeding   Event Date/Time   First Provider Initiated Contact with Patient 01/26/21 2229        SUBJECTIVE HPI: Marisa Page is a 25 y.o. G3P1011 at [redacted]w[redacted]d by LMP who presents to maternity admissions reporting vaginal bleeding for the past half hour.  Very mild cramping in LLQ.  Had had Covid for 3 days, but not many symptoms with it right now. Recently had a miscarriage earlier this year. . She denies vaginal itching/burning, urinary symptoms, h/a, dizziness, n/v, or fever/chills.   Treated for UTI (confirmed by culture), but stopped Duracef due to throat itching (allergic to PCN)  Vaginal Bleeding The patient's primary symptoms include pelvic pain and vaginal bleeding. The patient's pertinent negatives include no genital itching, genital lesions or genital odor. This is a new problem. The current episode started today. The pain is mild. The problem affects the left side. She is pregnant. Associated symptoms include abdominal pain (mild LLQ cramping). Pertinent negatives include no back pain, chills, constipation, diarrhea, dysuria or fever. The vaginal discharge was bloody. The vaginal bleeding is lighter than menses. She has not been passing clots. She has not been passing tissue. Nothing aggravates the symptoms. She has tried nothing for the symptoms.   RN note: Began bleeding approximately 30 minutes ago. States a pinkish color. Denies intercourse in last 24 hours  Past Medical History:  Diagnosis Date   Medical history non-contributory    Past Surgical History:  Procedure Laterality Date   WISDOM TOOTH EXTRACTION     Social History   Socioeconomic History   Marital status: Significant Other    Spouse name: [name]   Number of children: 1   Years of education: Not on file   Highest education level: Some college, no degree  Occupational History   Occupation: Therapist, occupational    Comment: Press photographer  Tobacco Use   Smoking status: Never    Smokeless tobacco: Never  Vaping Use   Vaping Use: Never used  Substance and Sexual Activity   Alcohol use: Not Currently    Comment: occ   Drug use: Yes    Frequency: 14.0 times per week    Types: Marijuana    Comment: Last time 01/16/2021   Sexual activity: Yes    Birth control/protection: None  Other Topics Concern   Not on file  Social History Narrative   ** Merged History Encounter **       Social Determinants of Health   Financial Resource Strain: Low Risk    Difficulty of Paying Living Expenses: Not hard at all  Food Insecurity: No Food Insecurity   Worried About Programme researcher, broadcasting/film/video in the Last Year: Never true   Ran Out of Food in the Last Year: Never true  Transportation Needs: No Transportation Needs   Lack of Transportation (Medical): No   Lack of Transportation (Non-Medical): No  Physical Activity: Sufficiently Active   Days of Exercise per Week: 7 days   Minutes of Exercise per Session: 30 min  Stress: No Stress Concern Present   Feeling of Stress : Not at all  Social Connections: Moderately Isolated   Frequency of Communication with Friends and Family: More than three times a week   Frequency of Social Gatherings with Friends and Family: More than three times a week   Attends Religious Services: Never   Database administrator or Organizations: No   Attends Banker Meetings: Never   Marital Status: Living with partner  Intimate Partner Violence: Not At Risk   Fear of Current or Ex-Partner: No   Emotionally Abused: No   Physically Abused: No   Sexually Abused: No   No current facility-administered medications on file prior to encounter.   Current Outpatient Medications on File Prior to Encounter  Medication Sig Dispense Refill   acetaminophen (TYLENOL) 325 MG tablet Take 650 mg by mouth every 6 (six) hours as needed (pain).     cefadroxil (DURICEF) 500 MG capsule Take 1 capsule (500 mg total) by mouth 2 (two) times daily. 14 capsule 0    Prenatal Vit-Fe Fumarate-FA (PRENATAL MULTIVITAMIN) TABS tablet Take 1 tablet by mouth daily at 12 noon.     Allergies  Allergen Reactions   Penicillins Hives    Has patient had a PCN reaction causing immediate rash, facial/tongue/throat swelling, SOB or lightheadedness with hypotension: Unknown Has patient had a PCN reaction causing severe rash involving mucus membranes or skin necrosis: Unknown Has patient had a PCN reaction that required hospitalization: No Has patient had a PCN reaction occurring within the last 10 years: No If all of the above answers are "NO", then may proceed with Cephalosporin use.     I have reviewed patient's Past Medical Hx, Surgical Hx, Family Hx, Social Hx, medications and allergies.   ROS:  Review of Systems  Constitutional:  Negative for chills and fever.  Gastrointestinal:  Positive for abdominal pain (mild LLQ cramping). Negative for constipation and diarrhea.  Genitourinary:  Positive for pelvic pain and vaginal bleeding. Negative for dysuria.  Musculoskeletal:  Negative for back pain.  Review of Systems  Other systems negative   Physical Exam  Physical Exam Patient Vitals for the past 24 hrs:  BP Temp Pulse SpO2  01/26/21 2215 126/70 98 F (36.7 C) 60 99 %   Constitutional: Well-developed, well-nourished female in no acute distress.  Cardiovascular: normal rate Respiratory: normal effort GI: Abd soft, non-tender. Pos BS x 4 MS: Extremities nontender, no edema, normal ROM Neurologic: Alert and oriented x 4.  GU: Neg CVAT.  PELVIC EXAM: deferred in lieu of [place] since bleeding is very light  LAB RESULTS Results for orders placed or performed during the hospital encounter of 01/26/21 (from the past 24 hour(s))  CBC     Status: Abnormal   Collection Time: 01/26/21 10:50 PM  Result Value Ref Range   WBC 9.6 4.0 - 10.5 K/uL   RBC 3.92 3.87 - 5.11 MIL/uL   Hemoglobin 11.7 (L) 12.0 - 15.0 g/dL   HCT 19.4 (L) 17.4 - 08.1 %   MCV 90.1 80.0 -  100.0 fL   MCH 29.8 26.0 - 34.0 pg   MCHC 33.1 30.0 - 36.0 g/dL   RDW 44.8 18.5 - 63.1 %   Platelets 224 150 - 400 K/uL   nRBC 0.0 0.0 - 0.2 %  Comprehensive metabolic panel     Status: Abnormal   Collection Time: 01/26/21 10:50 PM  Result Value Ref Range   Sodium 134 (L) 135 - 145 mmol/L   Potassium 3.6 3.5 - 5.1 mmol/L   Chloride 102 98 - 111 mmol/L   CO2 24 22 - 32 mmol/L   Glucose, Bld 98 70 - 99 mg/dL   BUN 11 6 - 20 mg/dL   Creatinine, Ser 4.97 0.44 - 1.00 mg/dL   Calcium 02.6 (H) 8.9 - 10.3 mg/dL   Total Protein 7.3 6.5 - 8.1 g/dL   Albumin 3.7 3.5 - 5.0 g/dL   AST 19 15 -  41 U/L   ALT 18 0 - 44 U/L   Alkaline Phosphatase 58 38 - 126 U/L   Total Bilirubin 0.7 0.3 - 1.2 mg/dL   GFR, Estimated >92 >42 mL/min   Anion gap 8 5 - 15  hCG, quantitative, pregnancy     Status: Abnormal   Collection Time: 01/26/21 10:50 PM  Result Value Ref Range   hCG, Beta Chain, Quant, S 47,454 (H) <5 mIU/mL     A/Positive/-- (09/19 1155)  IMAGING US OB LESS THAN 14 WEEKS WITH OB TRANSVAGINAL  Result Date: 01/26/2021 CLINICAL DATA:  Vaginal bleeding EXAM: OBSTETRIC <14 WK [place] AND TRANSVAGINAL OB US TECHNIQUE: Both transabdominal and transvaginal ultrasound examinations were performed for complete evaluation of the gestation as well as the maternal uterus, adnexal regions, and pelvic cul-de-sac. Transvaginal technique was performed to assess early pregnancy. COMPARISON:  None. FINDINGS: Intrauterine gestational sac: Single Yolk sac:  Visualized. Embryo:  Visualized. Cardiac Activity: Visualized. Heart Rate: 140 bpm CRL:  6.5 mm   6 w   3 d                  [place] EDC: 09/18/2021 Subchorionic hemorrhage: Large subchorionic hemorrhage along the right superior aspect of the gestational sac measuring 1.8 x 1.0 x 2.8 cm. Maternal uterus/adnexae: Right ovary measures 2.1 x 1.5 x 1.4 cm and the left ovary measures 4.0 x 2.3 x 2.7 cm. No adnexal masses. Trace free fluid. IMPRESSION: 1. Single live intrauterine  pregnancy as above estimated age 62 weeks and 3 days. 2. Large subchorionic hemorrhage. Electronically Signed   By: Sharlet Salina M.D.   On: 01/26/2021 23:23     MAU Management/MDM: Ordered usual first trimester r/o ectopic labs.   Pelvic exam and cultures done last visit Will check baseline Ultrasound to rule out ectopic.  This bleeding/pain can represent a normal pregnancy with bleeding, spontaneous abortion or even an ectopic which can be life-threatening.  The process as listed above helps to determine which of these is present.  Reviewed results  Reassuring to rule out etopic and SAB  Discussed [facility] has an increased risk of SAB.  Recommend pelvic rest.  Recommend supportive care for Covid  ASSESSMENT 1. Bleeding in early pregnancy   2. Pregnancy of unknown anatomic location   3.    Single IUP at [redacted]w[redacted]d 4.    Large subchorionic hemorrhage 5.    Covid 6.    UTI (proteus)  PLAN Discharge home Has appt at office in November Bleeding precautions Pelvic rest Rx Macrobid for UTI Isolation and supportive care for Covid  Pt stable at time of discharge. Encouraged to return here if she develops worsening of symptoms, increase in pain, fever, or other concerning symptoms.    Wynelle Bourgeois CNM, MSN Certified Nurse-Midwife 01/26/2021  10:29 PM

## 2021-01-27 LAB — HCG, QUANTITATIVE, PREGNANCY: hCG, Beta Chain, Quant, S: 47454 m[IU]/mL — ABNORMAL HIGH (ref <5)

## 2021-01-27 MED ORDER — NITROFURANTOIN MONOHYD MACRO 100 MG PO CAPS: 100.0000 mg | ORAL_CAPSULE | Freq: Two times a day (BID) | ORAL | 1 refills | Status: DC

## 2021-02-04 ENCOUNTER — Other Ambulatory Visit (HOSPITAL_COMMUNITY)
Admission: RE | Admit: 2021-02-04 | Discharge: 2021-02-04 | Disposition: A | Discharge status: Home / Self Care | Payer: Medicaid Other | Source: Ambulatory Visit | Attending: Certified Nurse Midwife | Admitting: Certified Nurse Midwife

## 2021-02-04 ENCOUNTER — Other Ambulatory Visit: Payer: Self-pay

## 2021-02-04 ENCOUNTER — Ambulatory Visit (INDEPENDENT_AMBULATORY_CARE_PROVIDER_SITE_OTHER): Payer: Medicaid Other | Admitting: Certified Nurse Midwife

## 2021-02-04 VITALS — BP 109/67 | HR 74 | Wt 219.4 lb

## 2021-02-04 DIAGNOSIS — N898 Other specified noninflammatory disorders of vagina: Secondary | ICD-10-CM | POA: Diagnosis present

## 2021-02-04 DIAGNOSIS — O26891 Other specified pregnancy related conditions, first trimester: Secondary | ICD-10-CM | POA: Diagnosis not present

## 2021-02-04 DIAGNOSIS — O09299 Supervision of pregnancy with other poor reproductive or obstetric history, unspecified trimester: Secondary | ICD-10-CM

## 2021-02-04 DIAGNOSIS — O219 Vomiting of pregnancy, unspecified: Secondary | ICD-10-CM

## 2021-02-04 DIAGNOSIS — Z3480 Encounter for supervision of other normal pregnancy, unspecified trimester: Secondary | ICD-10-CM

## 2021-02-04 DIAGNOSIS — Z3A01 Less than 8 weeks gestation of pregnancy: Secondary | ICD-10-CM

## 2021-02-04 MED ORDER — ONDANSETRON HCL 4 MG PO TABS
4.0000 mg | ORAL_TABLET | Freq: Once | ORAL | Status: AC
  Administered 2021-02-04: 4 mg via ORAL

## 2021-02-04 MED ORDER — ONDANSETRON 4 MG PO TBDP: 4.0000 mg | ORAL_TABLET | Freq: Four times a day (QID) | ORAL | 0 refills | Status: DC | PRN

## 2021-02-04 NOTE — Progress Notes (Signed)
SUBJECTIVE:  25 y.o. female complains of nausea, vomiting and brown vaginal discharge with irritation. Denies vaginal bleeding or significant pelvic pain or fever. No UTI symptoms. Denies history of known exposure to STD.  Patient's last menstrual period was 12/10/2020 (exact date).  OBJECTIVE:  She appears well, afebrile. Urine dipstick: not done.  ASSESSMENT:  Vaginal Discharge    PLAN:  GC, chlamydia, trichomonas, BVAG, CVAG probe sent to lab. Treatment: To be determined once lab results are received ROV prn if symptoms persist or worsen.

## 2021-02-09 LAB — CERVICOVAGINAL ANCILLARY ONLY
Bacterial Vaginitis (gardnerella): NEGATIVE
Candida Glabrata: NEGATIVE
Candida Vaginitis: NEGATIVE
Chlamydia: NEGATIVE
Comment: NEGATIVE
Comment: NEGATIVE
Comment: NEGATIVE
Comment: NEGATIVE
Comment: NEGATIVE
Comment: NORMAL
Neisseria Gonorrhea: NEGATIVE
Trichomonas: NEGATIVE

## 2021-02-23 ENCOUNTER — Other Ambulatory Visit: Payer: Self-pay

## 2021-02-23 DIAGNOSIS — Z3480 Encounter for supervision of other normal pregnancy, unspecified trimester: Secondary | ICD-10-CM

## 2021-02-23 DIAGNOSIS — O219 Vomiting of pregnancy, unspecified: Secondary | ICD-10-CM

## 2021-02-23 MED ORDER — ONDANSETRON 4 MG PO TBDP: 4.0000 mg | ORAL_TABLET | Freq: Three times a day (TID) | ORAL | 0 refills | Status: DC | PRN

## 2021-03-09 ENCOUNTER — Ambulatory Visit (INDEPENDENT_AMBULATORY_CARE_PROVIDER_SITE_OTHER): Payer: Medicaid Other | Admitting: Certified Nurse Midwife

## 2021-03-09 ENCOUNTER — Other Ambulatory Visit: Payer: Self-pay

## 2021-03-09 VITALS — BP 109/73 | HR 75 | Wt 214.8 lb

## 2021-03-09 DIAGNOSIS — Z3480 Encounter for supervision of other normal pregnancy, unspecified trimester: Secondary | ICD-10-CM

## 2021-03-09 DIAGNOSIS — Z3A12 12 weeks gestation of pregnancy: Secondary | ICD-10-CM

## 2021-03-09 DIAGNOSIS — O26891 Other specified pregnancy related conditions, first trimester: Secondary | ICD-10-CM | POA: Diagnosis not present

## 2021-03-09 DIAGNOSIS — M25559 Pain in unspecified hip: Secondary | ICD-10-CM | POA: Diagnosis not present

## 2021-03-09 DIAGNOSIS — Z3491 Encounter for supervision of normal pregnancy, unspecified, first trimester: Secondary | ICD-10-CM | POA: Diagnosis not present

## 2021-03-09 MED ORDER — PREPLUS 27-1 MG PO TABS: 1.0000 | ORAL_TABLET | Freq: Every day | ORAL | 12 refills | Status: DC

## 2021-03-09 MED ORDER — BLOOD PRESSURE MONITOR AUTOMAT DEVI: 0 refills | Status: DC

## 2021-03-09 NOTE — Progress Notes (Signed)
History:   Marisa Page is a 25 y.o. G3P1011 at [redacted]w[redacted]d by LMP, early ultrasound being seen today for her first obstetrical visit.  Her obstetrical history is not significant, she had an uncomplicated pregnancy with vaginal delivery in 2018. Patient does intend to breast feed. Pregnancy history fully reviewed.  Patient reports  improved nausea. Was having bad n/v and feeling depressed about her energy level and inability to keep food down, but this has improved in the last week. Having a lot of "hip pain" which is improved by stretching, she has also gotten a new pillow for sleep .      HISTORY: OB History  Gravida Para Term Preterm AB Living  3 1 1  0 1 1  SAB IAB Ectopic Multiple Live Births  1 0 0 0 1    # Outcome Date GA Lbr Len/2nd Weight Sex Delivery Anes PTL Lv  3 Current           2 SAB 07/27/20          1 Term 10/03/16 [redacted]w[redacted]d  6 lb 1 oz (2.75 kg) M Vag-Spont  N LIV    Last pap smear was done 06/2019 and was normal  Past Medical History:  Diagnosis Date   Medical history non-contributory    Past Surgical History:  Procedure Laterality Date   WISDOM TOOTH EXTRACTION     Family History  Problem Relation Age of Onset   Thyroid disease Mother    Diabetes Father    Stroke Father    Thyroid disease Maternal Aunt    Cancer Maternal Grandmother    Social History   Tobacco Use   Smoking status: Never   Smokeless tobacco: Never  Vaping Use   Vaping Use: Never used  Substance Use Topics   Alcohol use: Not Currently    Comment: occ   Drug use: Yes    Frequency: 14.0 times per week    Types: Marijuana    Comment: Last time 01/16/2021   Allergies  Allergen Reactions   Penicillins Hives    Has patient had a PCN reaction causing immediate rash, facial/tongue/throat swelling, SOB or lightheadedness with hypotension: Unknown Has patient had a PCN reaction causing severe rash involving mucus membranes or skin necrosis: Unknown Has patient had a PCN reaction  that required hospitalization: No Has patient had a PCN reaction occurring within the last 10 years: No If all of the above answers are "NO", then may proceed with Cephalosporin use.    Current Outpatient Medications on File Prior to Visit  Medication Sig Dispense Refill   ondansetron (ZOFRAN ODT) 4 MG disintegrating tablet Take 1 tablet (4 mg total) by mouth every 6 (six) hours as needed for nausea. 20 tablet 0   ondansetron (ZOFRAN ODT) 4 MG disintegrating tablet Take 1 tablet (4 mg total) by mouth every 8 (eight) hours as needed for nausea or vomiting. 20 tablet 0   acetaminophen (TYLENOL) 325 MG tablet Take 650 mg by mouth every 6 (six) hours as needed (pain).     nitrofurantoin, macrocrystal-monohydrate, (MACROBID) 100 MG capsule Take 1 capsule (100 mg total) by mouth 2 (two) times daily. (Patient not taking: Reported on 03/09/2021) 14 capsule 1   Prenatal Vit-Fe Fumarate-FA (PRENATAL MULTIVITAMIN) TABS tablet Take 1 tablet by mouth daily at 12 noon.     No current facility-administered medications on file prior to visit.    Review of Systems Pertinent items noted in HPI and remainder of comprehensive ROS otherwise negative.  Physical Exam:   Vitals:   03/09/21 0958  BP: 109/73  Pulse: 75  Weight: 214 lb 12.8 oz (97.4 kg)   Fetal Heart Rate (bpm): 138  Constitutional: Well-developed, well-nourished pregnant female in no acute distress.  HEENT: PERRLA Skin: normal color and turgor, no rash Cardiovascular: normal rate & rhythm, no murmur Respiratory: normal effort, lung sounds clear throughout GI: Abd soft, non-tender, pos BS x 4, gravid appropriate for gestational age MS: Extremities nontender, no edema, normal ROM Neurologic: Alert and oriented x 4.  GU: no CVA tenderness Pelvic: exam deferred  Assessment & Plan:  1. Supervision of low-risk pregnancy, first trimester - Doing well, scored moderately on perinatal mood screening, will make referral to Gwyndolyn Saxon, SW -  Genetic Screening - Blood Pressure Monitoring (BLOOD PRESSURE MONITOR AUTOMAT) DEVI; Monitor Blood Pressure as Needed  [id number]  Dispense: 1 each; Refill: 0 - Prenatal Vit-Fe Fumarate-FA (PREPLUS) 27-1 MG TABS; Take 1 tablet by mouth daily.  Dispense: 30 tablet; Refill: 12  2. [redacted] weeks gestation of pregnancy - Routine OB care   3. Pregnancy related hip pain in first trimester, antepartum - Discussed stretches to relieve pain and explained role of relaxin in early pregnancy. Pain not affecting movement or ADL's, just annoying. Advised to stretch twice daily and walk daily.  4. Initial obstetric visit in first trimester - Initial labs reviewed - Continue prenatal vitamins. - Problem list reviewed and updated. - Genetic Screening discussed, First trimester screen, Quad screen, and NIPS: results reviewed. - Ultrasound discussed; fetal anatomic survey: ordered. - Anticipatory guidance about prenatal visits given including labs, ultrasounds, and testing. - Discussed usage of Babyscripts and virtual visits as additional source of managing and completing prenatal visits in midst of coronavirus and pandemic.   - Encouraged to complete MyChart Registration for her ability to review results, send requests, and have questions addressed.  - The nature of [facility] with multiple MDs and Advanced Practice Providers was explained to patient; also emphasized that residents, students are part of our team. - Routine obstetric precautions reviewed. Encouraged to seek out care at office or emergency room [facility] MAU preferred) for urgent and/or emergent concerns. Return in about 4 weeks (around 04/06/2021) for IN-PERSON, LOB.    Edd Arbour, MSN, CNM, IBCLC Certified Nurse Midwife, [facility]

## 2021-03-16 ENCOUNTER — Other Ambulatory Visit: Payer: Self-pay

## 2021-03-16 ENCOUNTER — Ambulatory Visit (INDEPENDENT_AMBULATORY_CARE_PROVIDER_SITE_OTHER): Payer: Medicaid Other | Admitting: Licensed Clinical Social Worker

## 2021-03-16 DIAGNOSIS — F419 Anxiety disorder, unspecified: Secondary | ICD-10-CM

## 2021-03-16 DIAGNOSIS — O99340 Other mental disorders complicating pregnancy, unspecified trimester: Secondary | ICD-10-CM

## 2021-03-18 NOTE — BH Specialist Note (Signed)
Integrated Behavioral Health via Telemedicine Visit  03/18/2021 DAMITA EPPARD [id number]  Number of Integrated Behavioral Health visits: 1 Session Start time: 9:30am  Session End time: 9:47am Total time: 17 mins via mychart video   Referring Provider: Arlean Hopping CMA Patient/Family location: Home  [facility] Provider location: [facility]  All persons participating in visit: Pt N. McGibboney-Wyatt Types of Service: Individual psychotherapy  I connected with Swathi C McGibboney-Wyatt and/or Maye C McGibboney-Wyatt's n/a via  Telephone or Video Enabled Telemedicine Application  (Video is Caregility application) and verified that I am speaking with the correct person using two identifiers. Discussed confidentiality: Yes   I discussed the limitations of telemedicine and the availability of in person appointments.  Discussed there is a possibility of technology failure and discussed alternative modes of communication if that failure occurs.  I discussed that engaging in this telemedicine visit, they consent to the provision of behavioral healthcare and the services will be billed under their insurance.  Patient and/or legal guardian expressed understanding and consented to Telemedicine visit: Yes   Presenting Concerns: Patient and/or family reports the following symptoms/concerns: Stress and anxiety since preganncy Duration of problem: approx two months ; Severity of problem: mild  Patient and/or Family's Strengths/Protective Factors: Concrete supports in place (healthy food, safe environments, etc.)  Goals Addressed: Patient will:  Reduce symptoms of: anxiety and stress   Increase knowledge and/or ability of: coping skills   Demonstrate ability to: Increase healthy adjustment to current life circumstances  Progress towards Goals: Ongoing  Interventions: Interventions utilized:  Supportive Counseling Standardized Assessments completed: PHQ 9   Assessment: Patient  currently experiencing anxiety affecting pregnancy.  Ms. Lindie Spruce reports worry, trouble sleeping, nausea and anxious mood  Patient may benefit from integrated behavioral health.  Plan: Follow up with behavioral health clinician on : 2 weeks via mychart  Behavioral recommendations: indentify unhealthy thought patterns, prioritize rest and self care to prevent burnout Referral(s): Integrated [employer] (In Clinic)  I discussed the assessment and treatment plan with the patient and/or parent/guardian. They were provided an opportunity to ask questions and all were answered. They agreed with the plan and demonstrated an understanding of the instructions.   They were advised to call back or seek an in-person evaluation if the symptoms worsen or if the condition fails to improve as anticipated.  Gwyndolyn Saxon, LCSW

## 2021-03-30 ENCOUNTER — Encounter: Payer: Self-pay | Admitting: Advanced Practice Midwife

## 2021-03-30 ENCOUNTER — Other Ambulatory Visit: Payer: Self-pay

## 2021-03-30 ENCOUNTER — Ambulatory Visit (INDEPENDENT_AMBULATORY_CARE_PROVIDER_SITE_OTHER): Payer: Medicaid Other | Admitting: Advanced Practice Midwife

## 2021-03-30 VITALS — BP 124/82 | HR 108 | Temp 97.9°F | Wt 214.8 lb

## 2021-03-30 DIAGNOSIS — Z3A15 15 weeks gestation of pregnancy: Secondary | ICD-10-CM

## 2021-03-30 DIAGNOSIS — Z3480 Encounter for supervision of other normal pregnancy, unspecified trimester: Secondary | ICD-10-CM

## 2021-03-30 NOTE — Progress Notes (Signed)
   PRENATAL VISIT NOTE  Subjective:  Marisa Page is a 25 y.o. G3P1011 at [redacted]w[redacted]d being seen today for ongoing prenatal care.  She is currently monitored for the following issues for this low-risk pregnancy and has Supervision of other normal pregnancy, antepartum and H/O miscarriage, currently pregnant on their problem list.  Patient reports no complaints.  Contractions: Not present. Vag. Bleeding: None.  Movement: Absent. Denies leaking of fluid.   The following portions of the patient's history were reviewed and updated as appropriate: allergies, current medications, past family history, past medical history, past social history, past surgical history and problem list.   Objective:   Vitals:   03/30/21 1452  BP: 124/82  Pulse: (!) 108  Temp: 97.9 F (36.6 C)  Weight: 97.4 kg    Fetal Status: Fetal Heart Rate (bpm): 165   Movement: Absent     General:  Alert, oriented and cooperative. Patient is in no acute distress.  Skin: Skin is warm and dry. No rash noted.   Cardiovascular: Normal heart rate noted  Respiratory: Normal respiratory effort, no problems with respiration noted  Abdomen: Soft, gravid, appropriate for gestational age.  Pain/Pressure: Absent     Pelvic: Cervical exam deferred        Extremities: Normal range of motion.  Edema: None  Mental Status: Normal mood and affect. Normal behavior. Normal judgment and thought content.   Assessment and Plan:  Pregnancy: G3P1011 at [redacted]w[redacted]d 1. Supervision of other normal pregnancy, antepartum   2. [redacted] weeks gestation of pregnancy - Patient would like to wait to have AFP done at next visit   Preterm labor symptoms and general obstetric precautions including but not limited to vaginal bleeding, contractions, leaking of fluid and fetal movement were reviewed in detail with the patient. Please refer to After Visit Summary for other counseling recommendations.   Return in about 4 weeks (around 04/27/2021).  Future  Appointments  Date Time Provider Department Center  04/18/2021 12:30 PM [facility] NURSE [facility]  04/18/2021 12:45 PM [facility] US4 [facility]  05/04/2021  3:35 PM Judeth Horn, NP [facility] None    Thressa Sheller DNP, CNM  03/30/21  3:00 PM

## 2021-04-18 ENCOUNTER — Encounter: Payer: Self-pay | Admitting: *Deleted

## 2021-04-18 ENCOUNTER — Other Ambulatory Visit: Payer: Self-pay | Admitting: *Deleted

## 2021-04-18 ENCOUNTER — Ambulatory Visit: Payer: Medicaid Other | Admitting: *Deleted

## 2021-04-18 ENCOUNTER — Other Ambulatory Visit: Payer: Self-pay

## 2021-04-18 ENCOUNTER — Ambulatory Visit: Payer: Medicaid Other | Attending: Certified Nurse Midwife

## 2021-04-18 VITALS — BP 114/64 | HR 63

## 2021-04-18 DIAGNOSIS — N926 Irregular menstruation, unspecified: Secondary | ICD-10-CM | POA: Diagnosis not present

## 2021-04-18 DIAGNOSIS — Z3A01 Less than 8 weeks gestation of pregnancy: Secondary | ICD-10-CM | POA: Diagnosis present

## 2021-04-18 DIAGNOSIS — Z3A18 18 weeks gestation of pregnancy: Secondary | ICD-10-CM | POA: Diagnosis not present

## 2021-04-18 DIAGNOSIS — Z3201 Encounter for pregnancy test, result positive: Secondary | ICD-10-CM

## 2021-04-18 DIAGNOSIS — O99212 Obesity complicating pregnancy, second trimester: Secondary | ICD-10-CM | POA: Insufficient documentation

## 2021-04-18 DIAGNOSIS — Z3480 Encounter for supervision of other normal pregnancy, unspecified trimester: Secondary | ICD-10-CM | POA: Diagnosis present

## 2021-04-18 DIAGNOSIS — Z362 Encounter for other antenatal screening follow-up: Secondary | ICD-10-CM

## 2021-04-18 DIAGNOSIS — O09299 Supervision of pregnancy with other poor reproductive or obstetric history, unspecified trimester: Secondary | ICD-10-CM | POA: Diagnosis present

## 2021-04-18 DIAGNOSIS — Z363 Encounter for antenatal screening for malformations: Secondary | ICD-10-CM | POA: Diagnosis not present

## 2021-04-18 DIAGNOSIS — Z6833 Body mass index (BMI) 33.0-33.9, adult: Secondary | ICD-10-CM

## 2021-05-04 ENCOUNTER — Ambulatory Visit (INDEPENDENT_AMBULATORY_CARE_PROVIDER_SITE_OTHER): Payer: Medicaid Other

## 2021-05-04 ENCOUNTER — Other Ambulatory Visit: Payer: Self-pay

## 2021-05-04 VITALS — BP 110/67 | HR 63 | Temp 97.5°F | Wt 218.8 lb

## 2021-05-04 DIAGNOSIS — Z3A20 20 weeks gestation of pregnancy: Secondary | ICD-10-CM

## 2021-05-04 DIAGNOSIS — O09299 Supervision of pregnancy with other poor reproductive or obstetric history, unspecified trimester: Secondary | ICD-10-CM

## 2021-05-04 DIAGNOSIS — Z3480 Encounter for supervision of other normal pregnancy, unspecified trimester: Secondary | ICD-10-CM

## 2021-05-04 NOTE — Progress Notes (Signed)
° °  PRENATAL VISIT NOTE  Subjective:  Marisa Page is a 26 y.o. G3P1011 at [redacted]w[redacted]d being seen today for ongoing prenatal care.  She is currently monitored for the following issues for this low-risk pregnancy and has Supervision of other normal pregnancy, antepartum and H/O miscarriage, currently pregnant on their problem list.  Patient reports round ligament pain. Stretching and warm baths help.  Contractions: Not present. Vag. Bleeding: None.  Movement: Present. Denies leaking of fluid.   The following portions of the patient's history were reviewed and updated as appropriate: allergies, current medications, past family history, past medical history, past social history, past surgical history and problem list.   Objective:   Vitals:   05/04/21 1534  BP: 110/67  Pulse: 63  Temp: (!) 97.5 F (36.4 C)  Weight: 218 lb 12.8 oz (99.2 kg)    Fetal Status: Fetal Heart Rate (bpm): 151   Movement: Present     General:  Alert, oriented and cooperative. Patient is in no acute distress.  Skin: Skin is warm and dry. No rash noted.   Cardiovascular: Normal heart rate noted  Respiratory: Normal respiratory effort, no problems with respiration noted  Abdomen: Soft, gravid, appropriate for gestational age.  Pain/Pressure: Present     Pelvic: Cervical exam deferred        Extremities: Normal range of motion.  Edema: Trace  Mental Status: Normal mood and affect. Normal behavior. Normal judgment and thought content.   Assessment and Plan:  Pregnancy: G3P1011 at [redacted]w[redacted]d 1. Supervision of other normal pregnancy, antepartum - ROB. Doing well - Encouraged continued stretching for round ligament pain. Increase water intake and use support belt. May use heat/ice as well. Tylenol prn - Anticipatory guidance for upcoming appointments  2. [redacted] weeks gestation of pregnancy  Preterm labor symptoms and general obstetric precautions including but not limited to vaginal bleeding, contractions, leaking  of fluid and fetal movement were reviewed in detail with the patient. Please refer to After Visit Summary for other counseling recommendations.   Return in about 4 weeks (around 06/01/2021).  Future Appointments  Date Time Provider Department Center  05/17/2021 12:30 PM [facility] NURSE [facility]  05/17/2021 12:45 PM [facility] US4 [facility]  06/01/2021  2:35 PM Bernerd Limbo, CNM [facility] None  06/29/2021  8:15 AM Bernerd Limbo, CNM [facility] None     Brand Males, CNM 05/04/21 4:47 PM

## 2021-05-17 ENCOUNTER — Ambulatory Visit: Payer: Medicaid Other | Attending: Obstetrics and Gynecology

## 2021-05-17 ENCOUNTER — Encounter: Payer: Self-pay | Admitting: *Deleted

## 2021-05-17 ENCOUNTER — Other Ambulatory Visit: Payer: Self-pay | Admitting: *Deleted

## 2021-05-17 ENCOUNTER — Other Ambulatory Visit: Payer: Self-pay

## 2021-05-17 ENCOUNTER — Ambulatory Visit: Payer: Medicaid Other | Admitting: *Deleted

## 2021-05-17 VITALS — BP 114/58 | HR 68

## 2021-05-17 DIAGNOSIS — O09299 Supervision of pregnancy with other poor reproductive or obstetric history, unspecified trimester: Secondary | ICD-10-CM

## 2021-05-17 DIAGNOSIS — E669 Obesity, unspecified: Secondary | ICD-10-CM | POA: Diagnosis not present

## 2021-05-17 DIAGNOSIS — O99212 Obesity complicating pregnancy, second trimester: Secondary | ICD-10-CM

## 2021-05-17 DIAGNOSIS — Z362 Encounter for other antenatal screening follow-up: Secondary | ICD-10-CM

## 2021-05-17 DIAGNOSIS — O321XX0 Maternal care for breech presentation, not applicable or unspecified: Secondary | ICD-10-CM | POA: Diagnosis not present

## 2021-05-17 DIAGNOSIS — Z3A22 22 weeks gestation of pregnancy: Secondary | ICD-10-CM | POA: Diagnosis not present

## 2021-05-17 DIAGNOSIS — Z6833 Body mass index (BMI) 33.0-33.9, adult: Secondary | ICD-10-CM

## 2021-05-17 DIAGNOSIS — Z3480 Encounter for supervision of other normal pregnancy, unspecified trimester: Secondary | ICD-10-CM | POA: Insufficient documentation

## 2021-05-20 ENCOUNTER — Ambulatory Visit (INDEPENDENT_AMBULATORY_CARE_PROVIDER_SITE_OTHER): Payer: Medicaid Other

## 2021-05-20 ENCOUNTER — Other Ambulatory Visit: Payer: Self-pay

## 2021-05-20 ENCOUNTER — Other Ambulatory Visit (HOSPITAL_COMMUNITY)
Admission: RE | Admit: 2021-05-20 | Discharge: 2021-05-20 | Disposition: A | Discharge status: Home / Self Care | Payer: Medicaid Other | Source: Ambulatory Visit

## 2021-05-20 VITALS — BP 108/69 | HR 77 | Temp 98.0°F | Wt 219.0 lb

## 2021-05-20 DIAGNOSIS — O26892 Other specified pregnancy related conditions, second trimester: Secondary | ICD-10-CM | POA: Diagnosis present

## 2021-05-20 DIAGNOSIS — N9089 Other specified noninflammatory disorders of vulva and perineum: Secondary | ICD-10-CM | POA: Insufficient documentation

## 2021-05-20 DIAGNOSIS — R300 Dysuria: Secondary | ICD-10-CM | POA: Insufficient documentation

## 2021-05-20 DIAGNOSIS — Z3492 Encounter for supervision of normal pregnancy, unspecified, second trimester: Secondary | ICD-10-CM

## 2021-05-20 DIAGNOSIS — N898 Other specified noninflammatory disorders of vagina: Secondary | ICD-10-CM | POA: Diagnosis present

## 2021-05-20 LAB — POCT URINALYSIS DIPSTICK OB
Bilirubin, UA: NEGATIVE
Blood, UA: NEGATIVE
Glucose, UA: NEGATIVE
Ketones, UA: NEGATIVE
Nitrite, UA: NEGATIVE
Spec Grav, UA: >=1.03 — AB (ref 1.010–1.025)
Urobilinogen, UA: 0.2 E.U./dL
pH, UA: 6.5 (ref 5.0–8.0)

## 2021-05-20 MED ORDER — PREPLUS 27-1 MG PO TABS: 1.0000 | ORAL_TABLET | Freq: Every day | ORAL | 12 refills | Status: DC

## 2021-05-20 MED ORDER — METRONIDAZOLE 0.75 % VA GEL: 1.0000 | Freq: Every day | VAGINAL | 0 refills | Status: DC

## 2021-05-20 NOTE — Progress Notes (Signed)
° °  PREGNANCY PROBLEM OFFICE VISIT  [name] MRN [id number]  Date of birth: 07/13/1995 Chief Complaint:   Vaginal Discharge  Subjective:   Marisa Page is a 26 y.o. G44P1011 female at [redacted]w[redacted]d with an Estimated Date of Delivery: 09/16/21.  Patient presents today with  vaginal itching, dysuria, clitoral irritation, and vaginal discharge .  She reports her symptoms started last night.  She reports the discharge is "clear or either white."  She states the itching is "mainly opening in the vaginal canal and leading into the clitoris and the more you go up the more irritation there is."  She states the dysuria occurs with urine release and not when touching the skin.  She reports an "overall unpleasantness."  She endorses that her last sexual encounter was about one week ago.    Patient endorses fetal movement. Patient denies abdominal cramping or contractions.  Patient denies vaginal bleeding.  Contractions: Not present. Vag. Bleeding: None.  Movement: Present.  Reviewed past medical,surgical, social, obstetrical and family history as well as problem list, medications and allergies.  Objective   Vitals:   05/20/21 1010  BP: 108/69  Pulse: 77  Temp: 98 F (36.7 C)  Weight: 219 lb (99.3 kg)  Body mass index is 33.3 kg/m.  Total Weight Gain:-1 lb (-0.454 kg)         Physical Examination:   General appearance: Well appearing, and in no distress  Mental status: Alert, oriented to person, place, and time  Skin: Warm & dry  Cardiovascular: Normal heart rate noted  Respiratory: Normal respiratory effort, no distress  Abdomen: Appears gravid Pelvic: Cervical exam deferred NEFG: No apparent erythema or lesions.  Discharge apparent-thin whitish gray. No odor. Speculum Exam: Mucosa pink, no erythema or lesions noted.  Moderate amt greenish white discharge. CV collected.  Extremities: Edema: None  Fetal Status:    Movement: Present   No results found  for this or any previous visit (from the past 24 hour(s)).  Assessment & Plan:  Low-risk pregnancy of a 26 y.o., G3P1011 at [redacted]w[redacted]d with an Estimated Date of Delivery: 09/16/21   1. Encounter for supervision of low-risk pregnancy in second trimester -Patient to return to office for regularly scheduled PNV.   2. Dysuria -UA to be obtained. -H/O UTI with treatment during pregnancy. -Will send UC today.  3. Clitoral irritation -Exam without significant findings.  4. Vaginal discharge  -CV collected. -Rx for metrogel sent to pharmacy on file.   5. Vaginal itching -CV Collected    Meds:  Meds ordered this encounter  Medications   Prenatal Vit-Fe Fumarate-FA (PREPLUS) 27-1 MG TABS    Sig: Take 1 tablet by mouth daily.    Dispense:  30 tablet    Refill:  12   Labs/procedures today:  Lab Orders         Culture, OB Urine         POC Urinalysis Dipstick OB      Reviewed: Preterm labor symptoms and general obstetric precautions including but not limited to vaginal bleeding, contractions, leaking of fluid and fetal movement were reviewed in detail with the patient.  All questions were answered.  Follow-up: No follow-ups on file.  Orders Placed This Encounter  Procedures   Culture, OB Urine   POC Urinalysis Dipstick OB   Cherre Robins MSN, CNM 05/20/2021

## 2021-05-22 LAB — CULTURE, OB URINE

## 2021-05-22 LAB — URINE CULTURE, OB REFLEX

## 2021-05-23 LAB — CERVICOVAGINAL ANCILLARY ONLY
Bacterial Vaginitis (gardnerella): POSITIVE — AB
Candida Glabrata: NEGATIVE
Candida Vaginitis: POSITIVE — AB
Chlamydia: NEGATIVE
Comment: NEGATIVE
Comment: NEGATIVE
Comment: NEGATIVE
Comment: NEGATIVE
Comment: NEGATIVE
Comment: NORMAL
Neisseria Gonorrhea: NEGATIVE
Trichomonas: NEGATIVE

## 2021-05-24 MED ORDER — TERCONAZOLE 0.4 % VA CREA: 1.0000 | TOPICAL_CREAM | Freq: Every day | VAGINAL | 0 refills | Status: DC

## 2021-05-24 NOTE — Addendum Note (Signed)
Addended by: Gerrit Heck L on: 05/24/2021 11:16 AM   Modules accepted: Orders

## 2021-05-27 ENCOUNTER — Telehealth: Payer: Self-pay | Admitting: *Deleted

## 2021-05-27 NOTE — Telephone Encounter (Signed)
Prior Authorization received from [facility] pharmacy for terconazole 0.4% vaginal cream. Received PA approval for terconazole 0.4% vaginal carem via CovermyMeds [employer].  Med approved for 05/27/2021 - 05/27/2022.  [facility] pharmacy informed.  PA confirmation number [id number].   Clovis Pu, RN

## 2021-06-01 ENCOUNTER — Other Ambulatory Visit: Payer: Self-pay

## 2021-06-01 ENCOUNTER — Ambulatory Visit (INDEPENDENT_AMBULATORY_CARE_PROVIDER_SITE_OTHER): Payer: Medicaid Other | Admitting: Certified Nurse Midwife

## 2021-06-01 ENCOUNTER — Encounter: Payer: Medicaid Other | Admitting: Certified Nurse Midwife

## 2021-06-01 VITALS — BP 113/71 | HR 82 | Wt 220.0 lb

## 2021-06-01 DIAGNOSIS — O99891 Other specified diseases and conditions complicating pregnancy: Secondary | ICD-10-CM

## 2021-06-01 DIAGNOSIS — Z3480 Encounter for supervision of other normal pregnancy, unspecified trimester: Secondary | ICD-10-CM

## 2021-06-01 DIAGNOSIS — Z3492 Encounter for supervision of normal pregnancy, unspecified, second trimester: Secondary | ICD-10-CM

## 2021-06-01 DIAGNOSIS — Z3A24 24 weeks gestation of pregnancy: Secondary | ICD-10-CM

## 2021-06-01 DIAGNOSIS — F5104 Psychophysiologic insomnia: Secondary | ICD-10-CM

## 2021-06-01 DIAGNOSIS — M549 Dorsalgia, unspecified: Secondary | ICD-10-CM

## 2021-06-01 DIAGNOSIS — O219 Vomiting of pregnancy, unspecified: Secondary | ICD-10-CM

## 2021-06-01 MED ORDER — MAGNESIUM OXIDE -MG SUPPLEMENT 200 MG PO TABS: 400.0000 mg | ORAL_TABLET | Freq: Every day | ORAL | 3 refills | Status: DC

## 2021-06-01 MED ORDER — ONDANSETRON 4 MG PO TBDP: 4.0000 mg | ORAL_TABLET | Freq: Three times a day (TID) | ORAL | 0 refills | Status: DC | PRN

## 2021-06-01 NOTE — Progress Notes (Signed)
° °  PRENATAL VISIT NOTE  Subjective:  Marisa Page is a 26 y.o. G3P1011 at [redacted]w[redacted]d being seen today for ongoing prenatal care.  She is currently monitored for the following issues for this low-risk pregnancy and has Supervision of other normal pregnancy, antepartum and H/O miscarriage, currently pregnant on their problem list.  Patient reports  difficulty sleeping which is making her feel fatigued and low back pain. Also expressed some nervousness about the GTT as she felt very nauseated when she completed the testing in her last pregnancy .  Contractions: Not present. Vag. Bleeding: None.  Movement: Present. Denies leaking of fluid.   The following portions of the patient's history were reviewed and updated as appropriate: allergies, current medications, past family history, past medical history, past social history, past surgical history and problem list.   Objective:   Vitals:   06/01/21 0841  BP: 113/71  Pulse: 82  Weight: 220 lb (99.8 kg)   Fetal Status: Fetal Heart Rate (bpm): 135 Fundal Height: 25 cm Movement: Present     General:  Alert, oriented and cooperative. Patient is in no acute distress.  Skin: Skin is warm and dry. No rash noted.   Cardiovascular: Normal heart rate noted  Respiratory: Normal respiratory effort, no problems with respiration noted  Abdomen: Soft, gravid, appropriate for gestational age.  Pain/Pressure: Present     Pelvic: Cervical exam deferred        Extremities: Normal range of motion.  Edema: None  Mental Status: Normal mood and affect. Normal behavior. Normal judgment and thought content.   Assessment and Plan:  Pregnancy: G3P1011 at [redacted]w[redacted]d 1. Supervision of low-risk pregnancy, second trimester - Doing well, feeling regular and vigorous fetal movement   2. [redacted] weeks gestation of pregnancy - Routine OB care including anticipatory guidance re: GTT. Suggested she premedicate with zofran prior to her appointment.  3. Psychophysiological  insomnia - Suggested magnesium at bedtime and advised she can also take 1-3mg  of melatonin prior to bedtime to assist with sleep - Magnesium Oxide (MAG-OXIDE) 200 MG TABS; Take 2 tablets (400 mg total) by mouth at bedtime. If that amount causes loose stools in the am, switch to 200mg  daily at bedtime.  Dispense: 60 tablet; Refill: 3  4. Back pain affecting pregnancy in second trimester - Demonstrated round ligament massage and stretches to relieve back pain. - Magnesium Oxide (MAG-OXIDE) 200 MG TABS; Take 2 tablets (400 mg total) by mouth at bedtime. If that amount causes loose stools in the am, switch to 200mg  daily at bedtime.  Dispense: 60 tablet; Refill: 3  6. Nausea and vomiting during pregnancy - ondansetron (ZOFRAN ODT) 4 MG disintegrating tablet; Take 1 tablet (4 mg total) by mouth every 8 (eight) hours as needed for nausea or vomiting.  Dispense: 20 tablet; Refill: 0  Preterm labor symptoms and general obstetric precautions including but not limited to vaginal bleeding, contractions, leaking of fluid and fetal movement were reviewed in detail with the patient. Please refer to After Visit Summary for other counseling recommendations.   Return in about 4 weeks (around 06/29/2021) for IN-PERSON, LOB/GTT.  Future Appointments  Date Time Provider Department Center  06/17/2021 12:30 PM [facility] NURSE [facility]  06/17/2021 12:45 PM [facility]  06/29/2021  8:15 AM [facility] CNM [facility] None    08/29/2021, CNM

## 2021-06-17 ENCOUNTER — Ambulatory Visit: Payer: Medicaid Other | Attending: Obstetrics

## 2021-06-17 ENCOUNTER — Other Ambulatory Visit: Payer: Self-pay

## 2021-06-17 ENCOUNTER — Ambulatory Visit: Payer: Medicaid Other | Admitting: *Deleted

## 2021-06-17 VITALS — BP 107/63 | HR 68

## 2021-06-17 DIAGNOSIS — O358XX0 Maternal care for other (suspected) fetal abnormality and damage, not applicable or unspecified: Secondary | ICD-10-CM | POA: Insufficient documentation

## 2021-06-17 DIAGNOSIS — Z362 Encounter for other antenatal screening follow-up: Secondary | ICD-10-CM | POA: Diagnosis present

## 2021-06-17 DIAGNOSIS — Z3A27 27 weeks gestation of pregnancy: Secondary | ICD-10-CM

## 2021-06-17 DIAGNOSIS — Z3480 Encounter for supervision of other normal pregnancy, unspecified trimester: Secondary | ICD-10-CM | POA: Insufficient documentation

## 2021-06-17 DIAGNOSIS — O09299 Supervision of pregnancy with other poor reproductive or obstetric history, unspecified trimester: Secondary | ICD-10-CM | POA: Insufficient documentation

## 2021-06-17 DIAGNOSIS — O99212 Obesity complicating pregnancy, second trimester: Secondary | ICD-10-CM | POA: Insufficient documentation

## 2021-06-17 DIAGNOSIS — E668 Other obesity: Secondary | ICD-10-CM | POA: Diagnosis not present

## 2021-06-29 ENCOUNTER — Ambulatory Visit (INDEPENDENT_AMBULATORY_CARE_PROVIDER_SITE_OTHER): Payer: Medicaid Other | Admitting: Certified Nurse Midwife

## 2021-06-29 ENCOUNTER — Other Ambulatory Visit: Payer: Self-pay

## 2021-06-29 VITALS — BP 109/72 | HR 77 | Wt 237.2 lb

## 2021-06-29 DIAGNOSIS — Z3492 Encounter for supervision of normal pregnancy, unspecified, second trimester: Secondary | ICD-10-CM

## 2021-06-29 DIAGNOSIS — Z3A28 28 weeks gestation of pregnancy: Secondary | ICD-10-CM

## 2021-06-29 NOTE — Patient Instructions (Signed)
Cortney Long, Chief Strategy Officer Midwife: [phone] ?[email or web address] ? ?Beckey Downing, Certified Nurse Midwife: [phone] ?[email or web address] ?

## 2021-06-29 NOTE — Progress Notes (Signed)
? ?  PRENATAL VISIT NOTE ? ?Subjective:  ?Marisa Page is a 26 y.o. G3P1011 at [redacted]w[redacted]d being seen today for ongoing prenatal care.  She is currently monitored for the following issues for this low-risk pregnancy and has Supervision of other normal pregnancy, antepartum and H/O miscarriage, currently pregnant on their problem list. ? ?Patient reports no complaints. Expressed interest in having an unattended homebirth due to not wanting a lot of people in the room when she delivers. Last delivery was with an epidural and she felt like there was a "sea of nurses in blue" that came in and were shouting instructions at her. Desires a much calmer environment and a waterbirth this time. Contractions: Not present. Vag. Bleeding: None.  Movement: Present. Denies leaking of fluid.  ? ?The following portions of the patient's history were reviewed and updated as appropriate: allergies, current medications, past family history, past medical history, past social history, past surgical history and problem list.  ? ?Objective:  ? ?Vitals:  ? 06/29/21 0812  ?BP: 109/72  ?Pulse: 77  ?Weight: 237 lb 3.2 oz (107.6 kg)  ? ? ?Fetal Status: Fetal Heart Rate (bpm): 138 Fundal Height: 28 cm Movement: Present    ? ?General:  Alert, oriented and cooperative. Patient is in no acute distress.  ?Skin: Skin is warm and dry. No rash noted.   ?Cardiovascular: Normal heart rate noted  ?Respiratory: Normal respiratory effort, no problems with respiration noted  ?Abdomen: Soft, gravid, appropriate for gestational age.  Pain/Pressure: Absent     ?Pelvic: Cervical exam deferred        ?Extremities: Normal range of motion.     ?Mental Status: Normal mood and affect. Normal behavior. Normal judgment and thought content.  ? ?Assessment and Plan:  ?Pregnancy: G3P1011 at [redacted]w[redacted]d ?1. Supervision of low-risk pregnancy, second trimester ?- Doing well, feeling regular and vigorous fetal movement  ?- Validated her concerns about the intensity of hospital  birth. Discussed importance of having a trained labor attendant who knows how to respond quickly and calmly IF any complications arise. Will include homebirth midwife info in AVS. ?- Reviewed ways we can help keep her hospital birth calm, which may include waterbirth if she meets all criteria. Assured her she can ask for calm, quiet voices, as few people in the room as possible, freedom to move and be positioned as she wants while pushing, etc. Pt expressed feeling reassured/relieved and will explore both options. ? ?2. [redacted] weeks gestation of pregnancy ?- Routine OB care including: ?- Glucose Tolerance, 2 Hours w/1 Hour ?- CBC ?- RPR ?- HIV Antibody (routine testing w rflx) ? ?Preterm labor symptoms and general obstetric precautions including but not limited to vaginal bleeding, contractions, leaking of fluid and fetal movement were reviewed in detail with the patient. ?Please refer to After Visit Summary for other counseling recommendations.  ? ?Return in about 2 weeks (around 07/13/2021) for IN-PERSON, LOB. ? ?Future Appointments  ?Date Time Provider Department Center  ?07/18/2021  8:45 AM Jerene Bears, MD [facility]  ? ? ?Bernerd Limbo, CNM ? ?

## 2021-06-29 NOTE — Progress Notes (Signed)
PHQ-9: 6  GAD-7: 9

## 2021-06-30 LAB — CBC
Hematocrit: 32.1 % — ABNORMAL LOW (ref 34.0–46.6)
Hemoglobin: 10.7 g/dL — ABNORMAL LOW (ref 11.1–15.9)
MCH: 30.8 pg (ref 26.6–33.0)
MCHC: 33.3 g/dL (ref 31.5–35.7)
MCV: 93 fL (ref 79–97)
Platelets: 253 10*3/uL (ref 150–450)
RBC: 3.47 x10E6/uL — ABNORMAL LOW (ref 3.77–5.28)
RDW: 13.6 % (ref 11.7–15.4)
WBC: 12 10*3/uL — ABNORMAL HIGH (ref 3.4–10.8)

## 2021-06-30 LAB — GLUCOSE TOLERANCE, 2 HOURS W/ 1HR
Glucose, 1 hour: 123 mg/dL (ref 70–179)
Glucose, 2 hour: 78 mg/dL (ref 70–152)
Glucose, Fasting: 84 mg/dL (ref 70–91)

## 2021-06-30 LAB — RPR: RPR Ser Ql: NONREACTIVE

## 2021-06-30 LAB — HIV ANTIBODY (ROUTINE TESTING W REFLEX): HIV Screen 4th Generation wRfx: NONREACTIVE

## 2021-07-16 ENCOUNTER — Other Ambulatory Visit: Payer: Self-pay

## 2021-07-16 ENCOUNTER — Inpatient Hospital Stay (HOSPITAL_COMMUNITY)
Admission: AD | Admit: 2021-07-16 | Discharge: 2021-07-16 | Disposition: A | Discharge status: Home / Self Care | Payer: Medicaid Other | Attending: Obstetrics & Gynecology | Admitting: Obstetrics & Gynecology

## 2021-07-16 ENCOUNTER — Encounter (HOSPITAL_COMMUNITY): Payer: Self-pay | Admitting: Obstetrics & Gynecology

## 2021-07-16 DIAGNOSIS — O26893 Other specified pregnancy related conditions, third trimester: Secondary | ICD-10-CM | POA: Insufficient documentation

## 2021-07-16 DIAGNOSIS — O2623 Pregnancy care for patient with recurrent pregnancy loss, third trimester: Secondary | ICD-10-CM | POA: Insufficient documentation

## 2021-07-16 DIAGNOSIS — Z3689 Encounter for other specified antenatal screening: Secondary | ICD-10-CM

## 2021-07-16 DIAGNOSIS — N764 Abscess of vulva: Secondary | ICD-10-CM | POA: Diagnosis not present

## 2021-07-16 DIAGNOSIS — L0231 Cutaneous abscess of buttock: Secondary | ICD-10-CM

## 2021-07-16 DIAGNOSIS — O09299 Supervision of pregnancy with other poor reproductive or obstetric history, unspecified trimester: Secondary | ICD-10-CM

## 2021-07-16 DIAGNOSIS — Z3A31 31 weeks gestation of pregnancy: Secondary | ICD-10-CM | POA: Insufficient documentation

## 2021-07-16 DIAGNOSIS — Z3480 Encounter for supervision of other normal pregnancy, unspecified trimester: Secondary | ICD-10-CM

## 2021-07-16 MED ORDER — LIDOCAINE HCL 1 % IJ SOLN
30.0000 mL | Freq: Once | INTRAMUSCULAR | Status: AC
  Administered 2021-07-16: 10 mL via INTRADERMAL
  Filled 2021-07-16: qty 30

## 2021-07-16 MED ORDER — CLINDAMYCIN HCL 150 MG PO CAPS: 150.0000 mg | ORAL_CAPSULE | Freq: Three times a day (TID) | ORAL | 0 refills | Status: AC

## 2021-07-16 MED ORDER — OXYCODONE HCL 5 MG PO TABS
10.0000 mg | ORAL_TABLET | Freq: Once | ORAL | Status: AC
  Administered 2021-07-16: 10 mg via ORAL
  Filled 2021-07-16: qty 2

## 2021-07-16 MED ORDER — BACITRACIN-NEOMYCIN-POLYMYXIN OINTMENT TUBE
TOPICAL_OINTMENT | CUTANEOUS | Status: DC | PRN
  Filled 2021-07-16: qty 14

## 2021-07-16 MED ORDER — LIDOCAINE HCL URETHRAL/MUCOSAL 2 % EX GEL
1.0000 "application " | Freq: Once | CUTANEOUS | Status: AC
  Administered 2021-07-16: 1 via TOPICAL
  Filled 2021-07-16: qty 6

## 2021-07-16 NOTE — MAU Note (Signed)
PT SAYS SHE HAS A CYST ON HER VAGINA ?NOTICED- SINCE WED ?[facility]  ? ?

## 2021-07-16 NOTE — Procedures (Signed)
I&D NOTE ?The indications for I&D  were reviewed - abscess.   Risks include pain, bleeding, infection, scarring and need for additional procedures were discussed. The patient stated understanding and agreed to undergo procedure today. Consent was signed, time out performed.  ? ?The patient's skin over the abscess was prepped with Betadine after lidocaine jelly was applied for 20 min. A stab incision was made with an 11 blade scalpel.  The pus was drained. 1% lidocaine was injected into the abscess. The back end of the qtip was used to break up loculations. Additional lidocaine was injected into the abscess.  The patient tolerated the procedure well.  ? ?Post-procedure instructions and ABX were given to the patient. The patient is to call with heavy bleeding, fever greater than 100.4, foul smelling vaginal discharge or other concerns.  ? ?

## 2021-07-16 NOTE — MAU Provider Note (Signed)
Chief Complaint:  Vaginal Pain ? ? Event Date/Time  ? First Provider Initiated Contact with Patient 07/16/21 [phone]   ?  ?HPI: Marisa Page is a 26 y.o. G3P1011 at 5020w1d who presents to maternity admissions reporting a very painful, large bump/cyst on the right lower edge of her vulva. She noticed it on Wednesday, thinks she has had a Bartholin cyst before and began using sitz baths with epsom salt and alternating cold and moist heat. The cyst has continued to grow so that by this morning she could not get comfortable in any position but laying on her side. Denies vaginal bleeding, leaking of fluid, decreased fetal movement, fever, falls, or recent illness. No other physical complaints. ? ?Pregnancy Course: Receives care at [facility] (transferred from [facility] Records reviewed. In June 2021, she had an "abscess of the left buttock" that was resolved with an I&D, loop drainage and doxycycline at [facility]. ? ?Past Medical History:  ?Diagnosis Date  ? Medical history non-contributory   ? ?OB History  ?Gravida Para Term Preterm AB Living  ?3 1 1  0 1 1  ?SAB IAB Ectopic Multiple Live Births  ?1 0 0   1  ?  ?# Outcome Date GA Lbr Len/2nd Weight Sex Delivery Anes PTL Lv  ?3 Current           ?2 SAB 07/27/20          ?1 Term 10/03/16 7174w2d  6 lb 1 oz (2.75 kg) M Vag-Spont  N LIV  ? ?Past Surgical History:  ?Procedure Laterality Date  ? WISDOM TOOTH EXTRACTION    ? ?Family History  ?Problem Relation Age of Onset  ? Thyroid disease Mother   ? Diabetes Father   ? Stroke Father   ? Thyroid disease Maternal Aunt   ? Cancer Maternal Grandmother   ? ?Social History  ? ?Tobacco Use  ? Smoking status: Never  ? Smokeless tobacco: Never  ?Vaping Use  ? Vaping Use: Never used  ?Substance Use Topics  ? Alcohol use: Not Currently  ?  Comment: occ  ? Drug use: Not Currently  ?  Frequency: 14.0 times per week  ?  Types: Marijuana  ?  Comment: Last time 01/16/2021  ? ?Allergies  ?Allergen Reactions  ?  Penicillins Hives  ?  Has patient had a PCN reaction causing immediate rash, facial/tongue/throat swelling, SOB or lightheadedness with hypotension: Unknown ?Has patient had a PCN reaction causing severe rash involving mucus membranes or skin necrosis: Unknown ?Has patient had a PCN reaction that required hospitalization: No ?Has patient had a PCN reaction occurring within the last 10 years: No ?If all of the above answers are "NO", then may proceed with Cephalosporin use. ?  ? ?Medications Prior to Admission  ?Medication Sig Dispense Refill Last Dose  ? Blood Pressure Monitoring (BLOOD PRESSURE MONITOR AUTOMAT) DEVI Monitor Blood Pressure as Needed  ?[id number] (Patient not taking: Reported on 06/17/2021) 1 each 0   ? Magnesium Oxide (MAG-OXIDE) 200 MG TABS Take 2 tablets (400 mg total) by mouth at bedtime. If that amount causes loose stools in the am, switch to 200mg  daily at bedtime. (Patient not taking: Reported on 06/29/2021) 60 tablet 3   ? ondansetron (ZOFRAN ODT) 4 MG disintegrating tablet Take 1 tablet (4 mg total) by mouth every 8 (eight) hours as needed for nausea or vomiting. (Patient not taking: Reported on 06/17/2021) 20 tablet 0   ? Prenatal Vit-Fe Fumarate-FA (WESTAB PLUS) 27-1 MG TABS Take  1 tablet by mouth daily.     ? ?I have reviewed patient's Past Medical Hx, Surgical Hx, Family Hx, Social Hx, medications and allergies.  ? ?ROS:  ?Review of Systems  ?Constitutional:  Negative for fever.  ?Gastrointestinal:  Negative for abdominal pain, constipation, nausea and vomiting.  ?Genitourinary:  Positive for vaginal pain. Negative for vaginal bleeding and vaginal discharge.  ?Neurological:  Negative for dizziness and light-headedness.  ? ?Physical Exam  ?Patient Vitals for the past 24 hrs: ? BP Temp Temp src Pulse Resp Height Weight  ?07/16/21 0702 116/74 98.3 ?F (36.8 ?C) Oral 89 18 5\' 8"  (1.727 m) 248 lb 3.2 oz (112.6 kg)  ? ?Physical Exam ?Vitals and nursing note reviewed. Exam conducted with a chaperone  present.  ?Constitutional:   ?   General: She is in acute distress.  ?   Appearance: Normal appearance. She is not ill-appearing.  ?HENT:  ?   Head: Normocephalic and atraumatic.  ?Eyes:  ?   Pupils: Pupils are equal, round, and reactive to light.  ?Cardiovascular:  ?   Rate and Rhythm: Normal rate and regular rhythm.  ?Pulmonary:  ?   Effort: Pulmonary effort is normal.  ?Abdominal:  ?   Palpations: Abdomen is soft.  ?   Tenderness: There is no abdominal tenderness.  ?Genitourinary: ?   General: Normal vulva.  ?   Vagina: No vaginal discharge.  ? ? ?Musculoskeletal:     ?   General: Swelling and tenderness present. Normal range of motion.  ?Skin: ?   General: Skin is warm and dry.  ?   Capillary Refill: Capillary refill takes less than 2 seconds.  ?Neurological:  ?   Mental Status: She is alert and oriented to person, place, and time.  ?Psychiatric:     ?   Mood and Affect: Mood normal.     ?   Behavior: Behavior normal.     ?   Thought Content: Thought content normal.     ?   Judgment: Judgment normal.  ?  ?Fetal Tracing: reactive ?Baseline: 135 ?Variability: moderate ?Accelerations: 15x15  ?Decelerations: none ?Toco: relaxed ?  ?Labs: ?None ? ?Imaging:  ?None ? ?MAU Course: ?Orders Placed This Encounter  ?Procedures  ? Discharge patient  ? ?Meds ordered this encounter  ?Medications  ? oxyCODONE (Oxy IR/ROXICODONE) immediate release tablet 10 mg  ? lidocaine (XYLOCAINE) 2 % jelly 1 application.  ? lidocaine (XYLOCAINE) 1 % (with pres) injection 30 mL  ?  Whatever standard vial is available  ? neomycin-bacitracin-polymyxin (NEOSPORIN) ointment  ? clindamycin (CLEOCIN) 150 MG capsule  ?  Sig: Take 1 capsule (150 mg total) by mouth 3 (three) times daily for 14 days.  ?  Dispense:  42 capsule  ?  Refill:  0  ?  Order Specific Question:   Supervising Provider  ?  Answer:   [[id number]]  ? ?MDM: ?Assessed area and discussed plan with patient who was feeling anxious about the I&D due to her last experience.   Applied a heat pack underneath a hot, wet washcloth and ordered lidocaine 2% jelly and 10mg  oxycodone for the procedure. Applied and let pt know we would let it take effect and come back in [place].  ? ?Pain meds and lidocaine jelly helped pt feel much calmer. Dr. to bedside for I&D (see separate note). Pt tolerated procedure very well. Advised pt to take the antibiotics for two weeks, keep the area clean and apply neosporin often. ? ?  Assessment: ?1. Supervision of other normal pregnancy, antepartum   ?2. H/O miscarriage, currently pregnant   ?3. Abscess of buttock, right   ?4. NST (non-stress test) reactive   ?5. [redacted] weeks gestation of pregnancy   ? ?Plan: ?Discharge home in stable condition with return precautions (or can be seen in the office if it needs to be drained again). ?  ? Follow-up Information   ? ? [facility] Follow up.   ?Why: as scheduled for ongoing prenatal care ?Contact information: ?[id number] ?[place] [address] ? ?  ?  ? ?  ?  ? ?  ?  ?Allergies as of 07/16/2021   ? ?   Reactions  ? Penicillins Hives  ? Has patient had a PCN reaction causing immediate rash, facial/tongue/throat swelling, SOB or lightheadedness with hypotension: Unknown ?Has patient had a PCN reaction causing severe rash involving mucus membranes or skin necrosis: Unknown ?Has patient had a PCN reaction that required hospitalization: No ?Has patient had a PCN reaction occurring within the last 10 years: No ?If all of the above answers are "NO", then may proceed with Cephalosporin use.  ? ?  ? ?  ?Medication List  ?  ? ?STOP taking these medications   ? ?ondansetron 4 MG disintegrating tablet ?Commonly known as: Zofran ODT ?  ? ?  ? ?TAKE these medications   ? ?Blood Pressure Monitor Automat Devi ?Monitor Blood Pressure as Needed  ?[id number] ?  ?clindamycin 150 MG capsule ?Commonly known as: Cleocin ?Take 1 capsule (150 mg total) by mouth 3 (three) times daily for 14 days. ?   ?Magnesium Oxide 200 MG Tabs ?Commonly known as: Mag-Oxide ?Take 2 tablets (400 mg total) by mouth at bedtime. If that amount causes loose stools in the am, switch to 200mg  daily at bedtime. ?  ?WesTab Plus 27-1

## 2021-07-18 ENCOUNTER — Ambulatory Visit (INDEPENDENT_AMBULATORY_CARE_PROVIDER_SITE_OTHER): Payer: Medicaid Other | Admitting: Obstetrics & Gynecology

## 2021-07-18 ENCOUNTER — Other Ambulatory Visit: Payer: Self-pay

## 2021-07-18 VITALS — BP 112/71 | HR 70 | Wt 247.8 lb

## 2021-07-18 DIAGNOSIS — L0231 Cutaneous abscess of buttock: Secondary | ICD-10-CM

## 2021-07-18 DIAGNOSIS — Z3A31 31 weeks gestation of pregnancy: Secondary | ICD-10-CM

## 2021-07-18 DIAGNOSIS — Z23 Encounter for immunization: Secondary | ICD-10-CM | POA: Diagnosis not present

## 2021-07-18 DIAGNOSIS — O99013 Anemia complicating pregnancy, third trimester: Secondary | ICD-10-CM

## 2021-07-18 DIAGNOSIS — Z3403 Encounter for supervision of normal first pregnancy, third trimester: Secondary | ICD-10-CM

## 2021-07-18 MED ORDER — FERROUS SULFATE 325 (65 FE) MG PO TABS: 325.0000 mg | ORAL_TABLET | Freq: Every day | ORAL | 3 refills | Status: DC

## 2021-07-18 NOTE — Progress Notes (Signed)
? ?  PRENATAL VISIT NOTE ? ?Subjective:  ?Marisa Page is a 26 y.o. G3P1011 at [redacted]w[redacted]d being seen today for ongoing prenatal care.  She is currently monitored for the following issues for this low-risk pregnancy and has Supervision of other normal pregnancy, antepartum and H/O miscarriage, currently pregnant on their problem list. ? ?Patient reports  being seen in MAU over the weekend for vulvar abscess.  I&D was performed.  On abx.  Dosing reviewed .  Contractions: Not present. Vag. Bleeding: None.  Movement: Present. Denies leaking of fluid.  ? ?Pt considering water birth as well as home birth.  Information given. ? ?The following portions of the patient's history were reviewed and updated as appropriate: allergies, current medications, past family history, past medical history, past social history, past surgical history and problem list.  ? ?Objective:  ? ?Vitals:  ? 07/18/21 0927  ?BP: 112/71  ?Pulse: 70  ?Weight: 247 lb 12.8 oz (112.4 kg)  ? ? ?Fetal Status: Fetal Heart Rate (bpm): 141 Fundal Height: 32 cm Movement: Present    ? ?General:  Alert, oriented and cooperative. Patient is in no acute distress.  ?Skin: Skin is warm and dry. No rash noted.   ?Cardiovascular: Normal heart rate noted  ?Respiratory: Normal respiratory effort, no problems with respiration noted  ?Abdomen: Soft, gravid, appropriate for gestational age.  Pain/Pressure: Absent     ?Pelvic: Cervical exam deferred      area of I&D with no significant drainage, about 4cm area of induration, mildly tender  ?Extremities: Normal range of motion.  Edema: None  ?Mental Status: Normal mood and affect. Normal behavior. Normal judgment and thought content.  ? ?Assessment and Plan:  ?Pregnancy: G3P1011 at [redacted]w[redacted]d ?1. [redacted] weeks gestation of pregnancy ?- on PNV ?- recheck 2 weeks ?- Tdap given today ? ?2. Abscess of buttock, right ?- on clindamydin ?- s/p I&D.  Healing well. ? ?3. Encounter for supervision of low-risk first pregnancy in third  trimester ?- information about home birth with CNM and water birth at hospital given ? ?4. Anemia during pregnancy in third trimester ?- oral iron started.   ? ? ?Preterm labor symptoms and general obstetric precautions including but not limited to vaginal bleeding, contractions, leaking of fluid and fetal movement were reviewed in detail with the patient. ?Please refer to After Visit Summary for other counseling recommendations.  ? ?Return in about 2 weeks (around 08/01/2021). ? ?Megan Salon, MD ? ?

## 2021-08-01 ENCOUNTER — Ambulatory Visit (INDEPENDENT_AMBULATORY_CARE_PROVIDER_SITE_OTHER): Payer: Medicaid Other | Admitting: Obstetrics & Gynecology

## 2021-08-01 VITALS — BP 116/79 | HR 76 | Wt 249.2 lb

## 2021-08-01 DIAGNOSIS — Z3480 Encounter for supervision of other normal pregnancy, unspecified trimester: Secondary | ICD-10-CM

## 2021-08-01 DIAGNOSIS — O99013 Anemia complicating pregnancy, third trimester: Secondary | ICD-10-CM | POA: Insufficient documentation

## 2021-08-01 DIAGNOSIS — O09299 Supervision of pregnancy with other poor reproductive or obstetric history, unspecified trimester: Secondary | ICD-10-CM

## 2021-08-01 DIAGNOSIS — Z3A33 33 weeks gestation of pregnancy: Secondary | ICD-10-CM

## 2021-08-01 DIAGNOSIS — O09293 Supervision of pregnancy with other poor reproductive or obstetric history, third trimester: Secondary | ICD-10-CM

## 2021-08-01 DIAGNOSIS — Z3483 Encounter for supervision of other normal pregnancy, third trimester: Secondary | ICD-10-CM

## 2021-08-01 NOTE — Progress Notes (Signed)
? ?  PRENATAL VISIT NOTE ? ?Subjective:  ?Marisa Page is a 26 y.o. G3P1011 at [redacted]w[redacted]d being seen today for ongoing prenatal care.  She is currently monitored for the following issues for this low-risk pregnancy and has Supervision of other normal pregnancy, antepartum; H/O miscarriage, currently pregnant; and Anemia during pregnancy in third trimester on their problem list. ? ?Patient reports  she is feeling pressure.  This is more with certain activities and certain movements .  Contractions: Irritability. Vag. Bleeding: None.  Movement: Present. Denies leaking of fluid.  ? ?The following portions of the patient's history were reviewed and updated as appropriate: allergies, current medications, past family history, past medical history, past social history, past surgical history and problem list.  ? ?Objective:  ? ?Vitals:  ? 08/01/21 1313  ?BP: 116/79  ?Pulse: 76  ?Weight: 249 lb 3.2 oz (113 kg)  ? ? ?Fetal Status: Fetal Heart Rate (bpm): 142   Movement: Present    ? ?General:  Alert, oriented and cooperative. Patient is in no acute distress.  ?Skin: Skin is warm and dry. No rash noted.   ?Cardiovascular: Normal heart rate noted  ?Respiratory: Normal respiratory effort, no problems with respiration noted  ?Abdomen: Soft, gravid, appropriate for gestational age.  Pain/Pressure: Present     ?Pelvic: Cervical exam performed in the presence of a chaperone Dilation: Closed      ?Extremities: Normal range of motion.  Edema: Trace  ?Mental Status: Normal mood and affect. Normal behavior. Normal judgment and thought content.  ? ?Assessment and Plan:  ?Pregnancy: G3P1011 at [redacted]w[redacted]d ?1. [redacted] weeks gestation of pregnancy ?- on PNV ?- recheck 2 weeks ?- interested in water birth.  Class scheduled 08/18/2021.  ? ?2. Supervision of other normal pregnancy, antepartum ? ?3. H/O miscarriage, currently pregnant ? ?4.  Anemia in pregnancy ?- oral iron recommended ? ?Preterm labor symptoms and general obstetric precautions  including but not limited to vaginal bleeding, contractions, leaking of fluid and fetal movement were reviewed in detail with the patient. ?Please refer to After Visit Summary for other counseling recommendations.  ? ?Return in about 2 weeks (around 08/15/2021). ? ?Future Appointments  ?Date Time Provider Department Center  ?08/19/2021 11:00 AM Marny Lowenstein, PA-C [facility]  ?08/26/2021 10:00 AM Jerene Bears, MD [facility]  ?09/01/2021  4:15 PM Jerene Bears, MD [facility]  ?09/09/2021 11:15 AM Marny Lowenstein, PA-C [facility]  ?09/16/2021 11:15 AM Jerene Bears, MD [facility]  ? ? ?Jerene Bears, MD  ?

## 2021-08-19 ENCOUNTER — Other Ambulatory Visit (HOSPITAL_COMMUNITY)
Admission: RE | Admit: 2021-08-19 | Discharge: 2021-08-19 | Disposition: A | Discharge status: Home / Self Care | Payer: Medicaid Other | Source: Ambulatory Visit | Attending: Medical | Admitting: Medical

## 2021-08-19 ENCOUNTER — Ambulatory Visit (INDEPENDENT_AMBULATORY_CARE_PROVIDER_SITE_OTHER): Payer: Medicaid Other | Admitting: Medical

## 2021-08-19 ENCOUNTER — Encounter (HOSPITAL_BASED_OUTPATIENT_CLINIC_OR_DEPARTMENT_OTHER): Payer: Self-pay | Admitting: Medical

## 2021-08-19 VITALS — BP 122/71 | HR 79 | Wt 252.0 lb

## 2021-08-19 DIAGNOSIS — Z3483 Encounter for supervision of other normal pregnancy, third trimester: Secondary | ICD-10-CM

## 2021-08-19 DIAGNOSIS — Z3A36 36 weeks gestation of pregnancy: Secondary | ICD-10-CM

## 2021-08-19 DIAGNOSIS — O99013 Anemia complicating pregnancy, third trimester: Secondary | ICD-10-CM

## 2021-08-19 DIAGNOSIS — Z3480 Encounter for supervision of other normal pregnancy, unspecified trimester: Secondary | ICD-10-CM | POA: Insufficient documentation

## 2021-08-19 NOTE — Addendum Note (Signed)
Addended by: Harrie Jeans on: 08/19/2021 11:43 AM ? ? Modules accepted: Orders ? ?

## 2021-08-19 NOTE — Progress Notes (Signed)
? ?  PRENATAL VISIT NOTE ? ?Subjective:  ?Marisa Page is a 26 y.o. G3P1011 at [redacted]w[redacted]d being seen today for ongoing prenatal care.  She is currently monitored for the following issues for this low-risk pregnancy and has Supervision of other normal pregnancy, antepartum; H/O miscarriage, currently pregnant; and Anemia during pregnancy in third trimester on their problem list. ? ?Patient reports fatigue and occasional contractions.  Contractions: Irritability. Vag. Bleeding: None.  Movement: Present. Denies leaking of fluid.  ? ?The following portions of the patient's history were reviewed and updated as appropriate: allergies, current medications, past family history, past medical history, past social history, past surgical history and problem list.  ? ?Objective:  ? ?Vitals:  ? 08/19/21 1107  ?BP: 122/71  ?Pulse: 79  ?Weight: 252 lb (114.3 kg)  ? ? ?Fetal Status: Fetal Heart Rate (bpm): 136   Movement: Present  Presentation: Vertex ? ?General:  Alert, oriented and cooperative. Patient is in no acute distress.  ?Skin: Skin is warm and dry. No rash noted.   ?Cardiovascular: Normal heart rate noted  ?Respiratory: Normal respiratory effort, no problems with respiration noted  ?Abdomen: Soft, gravid, appropriate for gestational age.  Pain/Pressure: Present     ?Pelvic: Cervical exam performed in the presence of a chaperone Dilation: Closed Effacement (%): 0 Station: -3  ?Extremities: Normal range of motion.  Edema: Trace  ?Mental Status: Normal mood and affect. Normal behavior. Normal judgment and thought content.  ? ?Assessment and Plan:  ?Pregnancy: G3P1011 at [redacted]w[redacted]d ?1. Supervision of other normal pregnancy, antepartum ?- GC/CT and GBS today  ?- Patient completed WB class, discussed plan for consent at the hospital as long as she is still a candidate after admisison ? ?2. Anemia during pregnancy in third trimester ? ?3. [redacted] weeks gestation of pregnancy ? ?Preterm labor symptoms and general obstetric  precautions including but not limited to vaginal bleeding, contractions, leaking of fluid and fetal movement were reviewed in detail with the patient. ?Please refer to After Visit Summary for other counseling recommendations.  ? ?Return in about 1 week (around 08/26/2021). ? ?Future Appointments  ?Date Time Provider Department Center  ?08/26/2021 10:00 AM Jerene Bears, MD [facility]  ?08/30/2021  3:30 PM Hurshel Party, CNM [facility]  ?09/09/2021 11:15 AM Marny Lowenstein, PA-C [facility]  ?09/13/2021  2:15 PM Leftwich-Kirby, Wilmer Floor, CNM [facility]  ? ? ?Vonzella Nipple, PA-C ? ?

## 2021-08-21 LAB — STREP GP B NAA+RFLX: Strep Gp B NAA+Rflx: NEGATIVE

## 2021-08-22 LAB — CERVICOVAGINAL ANCILLARY ONLY
Chlamydia: NEGATIVE
Comment: NEGATIVE
Comment: NORMAL
Neisseria Gonorrhea: NEGATIVE

## 2021-08-26 ENCOUNTER — Ambulatory Visit (INDEPENDENT_AMBULATORY_CARE_PROVIDER_SITE_OTHER): Payer: Medicaid Other | Admitting: Obstetrics & Gynecology

## 2021-08-26 ENCOUNTER — Telehealth (HOSPITAL_BASED_OUTPATIENT_CLINIC_OR_DEPARTMENT_OTHER): Payer: Self-pay | Admitting: Obstetrics & Gynecology

## 2021-08-26 ENCOUNTER — Encounter (HOSPITAL_BASED_OUTPATIENT_CLINIC_OR_DEPARTMENT_OTHER): Payer: Self-pay | Admitting: Obstetrics & Gynecology

## 2021-08-26 VITALS — BP 116/79 | HR 65 | Wt 257.0 lb

## 2021-08-26 DIAGNOSIS — Z3480 Encounter for supervision of other normal pregnancy, unspecified trimester: Secondary | ICD-10-CM

## 2021-08-26 DIAGNOSIS — Z3A37 37 weeks gestation of pregnancy: Secondary | ICD-10-CM

## 2021-08-26 DIAGNOSIS — O09299 Supervision of pregnancy with other poor reproductive or obstetric history, unspecified trimester: Secondary | ICD-10-CM

## 2021-08-26 DIAGNOSIS — O99013 Anemia complicating pregnancy, third trimester: Secondary | ICD-10-CM

## 2021-08-26 DIAGNOSIS — O09293 Supervision of pregnancy with other poor reproductive or obstetric history, third trimester: Secondary | ICD-10-CM

## 2021-08-26 NOTE — Telephone Encounter (Signed)
Called patient and left a message to call the office to let us know if she was on her way to her appointment . ?

## 2021-08-26 NOTE — Progress Notes (Signed)
? ?  PRENATAL VISIT NOTE ? ?Subjective:  ?Marisa Page is a 26 y.o. G3P1011 at [redacted]w[redacted]d being seen today for ongoing prenatal care.  She is currently monitored for the following issues for this low-risk pregnancy and has Supervision of other normal pregnancy, antepartum; H/O miscarriage, currently pregnant; and Anemia during pregnancy in third trimester on their problem list. ? ?Patient reports no complaints.  Contractions: Not present. Vag. Bleeding: None.  Movement: Present. Denies leaking of fluid.  ? ?The following portions of the patient's history were reviewed and updated as appropriate: allergies, current medications, past family history, past medical history, past social history, past surgical history and problem list.  ? ?Objective:  ? ?Vitals:  ? 08/26/21 1041  ?BP: 116/79  ?Pulse: 65  ?Weight: 257 lb (116.6 kg)  ? ? ?Fetal Status: Fetal Heart Rate (bpm): 129 Fundal Height: 36 cm Movement: Present  Presentation: Vertex ? ?General:  Alert, oriented and cooperative. Patient is in no acute distress.  ?Skin: Skin is warm and dry. No rash noted.   ?Cardiovascular: Normal heart rate noted  ?Respiratory: Normal respiratory effort, no problems with respiration noted  ?Abdomen: Soft, gravid, appropriate for gestational age.  Pain/Pressure: Absent     ?Pelvic: Cervical exam deferred Dilation: 1 Effacement (%): 20 Station: -3  ?Extremities: Normal range of motion.  Edema: Trace  ?Mental Status: Normal mood and affect. Normal behavior. Normal judgment and thought content.  ? ?Assessment and Plan:  ?Pregnancy: G3P1011 at [redacted]w[redacted]d ?1. [redacted] weeks gestation of pregnancy ?- on PNV ?- hoping for water birth.  Has completed class.  Certificate will be scanned into EPIC ? ?2. Supervision of other normal pregnancy, antepartum ? ?3. Anemia during pregnancy in third trimester ?- on iron ? ?4. H/O miscarriage, currently pregnant ? ?Preterm labor symptoms and general obstetric precautions including but not limited to vaginal  bleeding, contractions, leaking of fluid and fetal movement were reviewed in detail with the patient. ?Please refer to After Visit Summary for other counseling recommendations.  ? ?Return in about 1 week (around 09/02/2021). ? ?Future Appointments  ?Date Time Provider Department Center  ?08/30/2021  3:30 PM Leftwich-Kirby, Wilmer Floor, CNM [facility]  ?09/09/2021 11:15 AM Marny Lowenstein, PA-C [facility]  ?09/13/2021  2:15 PM Leftwich-Kirby, Wilmer Floor, CNM [facility]  ? ? ?Jerene Bears, MD ? ?

## 2021-08-30 ENCOUNTER — Ambulatory Visit (INDEPENDENT_AMBULATORY_CARE_PROVIDER_SITE_OTHER): Payer: Medicaid Other | Admitting: Advanced Practice Midwife

## 2021-08-30 ENCOUNTER — Encounter (HOSPITAL_BASED_OUTPATIENT_CLINIC_OR_DEPARTMENT_OTHER): Payer: Self-pay | Admitting: Advanced Practice Midwife

## 2021-08-30 VITALS — BP 117/73 | HR 85 | Wt 259.0 lb

## 2021-08-30 DIAGNOSIS — Z3A37 37 weeks gestation of pregnancy: Secondary | ICD-10-CM

## 2021-08-30 DIAGNOSIS — O99013 Anemia complicating pregnancy, third trimester: Secondary | ICD-10-CM

## 2021-08-30 DIAGNOSIS — Z3480 Encounter for supervision of other normal pregnancy, unspecified trimester: Secondary | ICD-10-CM

## 2021-08-30 NOTE — Progress Notes (Signed)
? ?  PRENATAL VISIT NOTE ? ?Subjective:  ?Marisa Page is a 26 y.o. G3P1011 at [redacted]w[redacted]d being seen today for ongoing prenatal care.  She is currently monitored for the following issues for this low-risk pregnancy and has Supervision of other normal pregnancy, antepartum; H/O miscarriage, currently pregnant; and Anemia during pregnancy in third trimester on their problem list. ? ?Patient reports occasional contractions.  Contractions: Irritability. Vag. Bleeding: None.  Movement: Present. Denies leaking of fluid.  ? ?The following portions of the patient's history were reviewed and updated as appropriate: allergies, current medications, past family history, past medical history, past social history, past surgical history and problem list.  ? ?Objective:  ? ?Vitals:  ? 08/30/21 1555  ?BP: 117/73  ?Pulse: 85  ?Weight: 259 lb (117.5 kg)  ? ? ?Fetal Status: Fetal Heart Rate (bpm): 147   Movement: Present    ? ?General:  Alert, oriented and cooperative. Patient is in no acute distress.  ?Skin: Skin is warm and dry. No rash noted.   ?Cardiovascular: Normal heart rate noted  ?Respiratory: Normal respiratory effort, no problems with respiration noted  ?Abdomen: Soft, gravid, appropriate for gestational age.  Pain/Pressure: Present     ?Pelvic: Cervical exam deferred        ?Extremities: Normal range of motion.  Edema: Trace  ?Mental Status: Normal mood and affect. Normal behavior. Normal judgment and thought content.  ? ?Assessment and Plan:  ?Pregnancy: G3P1011 at [redacted]w[redacted]d ?1. Supervision of other normal pregnancy, antepartum ?--Anticipatory guidance about next visits/weeks of pregnancy given.  ?--Reviewed labor readiness with patient including the [facility], evening primrose oil, and raspberry leaf tea.   ?- Pt interested in waterbirth and has attended the class.  ?- Reviewed conditions in labor that will risk her out of water immersion including thick meconium or blood stained amniotic fluid, non-reassuring  fetal status on monitor, excessive bleeding, hypertension, dizziness, use of IV meds, damaged equipment or staffing that does not allow for water immersion, etc.  ?- The attending midwife must be on the unit for water immersion to begin; pt understands this may delay the start of water immersion. ?- Reminded pt that signing consent in labor at the hospital also acknowledges they will exit the tub if the attending midwife requests. ?- Consent given to patient for review.  Consent will be reviewed and signed at the hospital by the waterbirth provider prior to use of the tub. ?- Discussed other labor support options if waterbirth becomes unavailable, including position change, freedom of movement, use of birthing ball, and/or use of hydrotherapy in the shower (dependent upon medical condition/provider discretion).   ? ?2. Anemia during pregnancy in third trimester ?--Hgb 10.7 on 11/29/21 ? ?3. [redacted] weeks gestation of pregnancy ? ? ?Term labor symptoms and general obstetric precautions including but not limited to vaginal bleeding, contractions, leaking of fluid and fetal movement were reviewed in detail with the patient. ?Please refer to After Visit Summary for other counseling recommendations.  ? ?Return in about 1 week (around 09/06/2021) for LOB, Midwife preferred. ? ?Future Appointments  ?Date Time Provider Department Center  ?09/09/2021 11:15 AM Marny Lowenstein, PA-C [facility]  ?09/13/2021  2:15 PM Leftwich-Kirby, Wilmer Floor, CNM [facility]  ? ? ?Sharen Counter, CNM  ?

## 2021-09-01 ENCOUNTER — Encounter (HOSPITAL_BASED_OUTPATIENT_CLINIC_OR_DEPARTMENT_OTHER): Payer: Medicaid Other | Admitting: Obstetrics & Gynecology

## 2021-09-09 ENCOUNTER — Ambulatory Visit (INDEPENDENT_AMBULATORY_CARE_PROVIDER_SITE_OTHER): Payer: Medicaid Other | Admitting: Medical

## 2021-09-09 VITALS — BP 138/92 | HR 77 | Wt 254.8 lb

## 2021-09-09 DIAGNOSIS — O99013 Anemia complicating pregnancy, third trimester: Secondary | ICD-10-CM

## 2021-09-09 DIAGNOSIS — Z3483 Encounter for supervision of other normal pregnancy, third trimester: Secondary | ICD-10-CM

## 2021-09-09 DIAGNOSIS — Z3480 Encounter for supervision of other normal pregnancy, unspecified trimester: Secondary | ICD-10-CM

## 2021-09-09 DIAGNOSIS — R030 Elevated blood-pressure reading, without diagnosis of hypertension: Secondary | ICD-10-CM | POA: Insufficient documentation

## 2021-09-09 DIAGNOSIS — Z3A39 39 weeks gestation of pregnancy: Secondary | ICD-10-CM

## 2021-09-09 NOTE — Progress Notes (Signed)
? ?  PRENATAL VISIT NOTE ? ?Subjective:  ?Marisa Page is a 26 y.o. G3P1011 at 38w0dbeing seen today for ongoing prenatal care.  She is currently monitored for the following issues for this high-risk pregnancy and has Supervision of other normal pregnancy, antepartum; H/O miscarriage, currently pregnant; Anemia during pregnancy in third trimester; and Elevated BP without diagnosis of hypertension on their problem list. ? ?Patient reports fatigue, occasional contractions and increased discharge.  Contractions: Irregular. Vag. Bleeding: None.  Movement: Present. Denies leaking of fluid.  ? ?The following portions of the patient's history were reviewed and updated as appropriate: allergies, current medications, past family history, past medical history, past social history, past surgical history and problem list.  ? ?Objective:  ? ?Vitals:  ? 09/09/21 1123 09/09/21 1208  ?BP: (!) 139/94 (!) 138/92  ?Pulse: 77   ?Weight: 254 lb 12.8 oz (115.6 kg)   ? ? ?Fetal Status: Fetal Heart Rate (bpm): 146   Movement: Present    ? ?General:  Alert, oriented and cooperative. Patient is in no acute distress.  ?Skin: Skin is warm and dry. No rash noted.   ?Cardiovascular: Normal heart rate noted  ?Respiratory: Normal respiratory effort, no problems with respiration noted  ?Abdomen: Soft, gravid, appropriate for gestational age.  Pain/Pressure: Present     ?Pelvic: Cervical exam performed in the presence of a chaperone        ?Extremities: Normal range of motion.  Edema: Trace  ?Mental Status: Normal mood and affect. Normal behavior. Normal judgment and thought content.  ? ?Assessment and Plan:  ?Pregnancy: G3P1011 at 325w0d1. Supervision of other normal pregnancy, antepartum ?- GBS negative  ? ?2. Anemia during pregnancy in third trimester ?- Last checked 06/29/21 Hbg was 10.7 ? ?3. Elevated BP without diagnosis of hypertension ?- CBC ?- Comp Met (CMET) ?- Protein / creatinine ratio, urine ?- Discussed with Dr. MiSabra Heck Patient is asymptomatic. Will monitor for new symptoms and check BP at home tomorrow. Dr. MiSabra Heckill call her to check BP.  ?- Warning signs for worsening HTN reviewed as well as when to present to MAU  ?- Patient advised that if diagnosed with HTN disorder she will no longer be a candidate for a WB ? ?4. [redacted] weeks gestation of pregnancy ? ?Term labor symptoms and general obstetric precautions including but not limited to vaginal bleeding, contractions, leaking of fluid and fetal movement were reviewed in detail with the patient. ?Please refer to After Visit Summary for other counseling recommendations.  ? ?Future Appointments  ?Date Time Provider DeJim Wells?09/13/2021  2:15 PM Leftwich-Kirby, LiKathie DikeCNM [facility]  ? ? ?JuKerry HoughPA-C ? ?

## 2021-09-10 LAB — PROTEIN / CREATININE RATIO, URINE
Creatinine, Urine: 213.7 mg/dL
Protein, Ur: 23.5 mg/dL
Protein/Creat Ratio: 110 mg/g creat (ref 0–200)

## 2021-09-10 LAB — COMPREHENSIVE METABOLIC PANEL
ALT: 12 IU/L (ref 0–32)
AST: 14 IU/L (ref 0–40)
Albumin/Globulin Ratio: 1.4 (ref 1.2–2.2)
Albumin: 4 g/dL (ref 3.9–5.0)
Alkaline Phosphatase: 141 IU/L — ABNORMAL HIGH (ref 44–121)
BUN/Creatinine Ratio: 13 (ref 9–23)
BUN: 12 mg/dL (ref 6–20)
Bilirubin Total: <0.2 mg/dL (ref 0.0–1.2)
CO2: 18 mmol/L — ABNORMAL LOW (ref 20–29)
Calcium: 10.4 mg/dL — ABNORMAL HIGH (ref 8.7–10.2)
Chloride: 104 mmol/L (ref 96–106)
Creatinine, Ser: 0.93 mg/dL (ref 0.57–1.00)
Globulin, Total: 2.8 g/dL (ref 1.5–4.5)
Glucose: 96 mg/dL (ref 70–99)
Potassium: 4.4 mmol/L (ref 3.5–5.2)
Sodium: 137 mmol/L (ref 134–144)
Total Protein: 6.8 g/dL (ref 6.0–8.5)
eGFR: 87 mL/min/{1.73_m2} (ref >59)

## 2021-09-10 LAB — CBC
Hematocrit: 36.2 % (ref 34.0–46.6)
Hemoglobin: 12 g/dL (ref 11.1–15.9)
MCH: 30.2 pg (ref 26.6–33.0)
MCHC: 33.1 g/dL (ref 31.5–35.7)
MCV: 91 fL (ref 79–97)
Platelets: 217 10*3/uL (ref 150–450)
RBC: 3.97 x10E6/uL (ref 3.77–5.28)
RDW: 14.1 % (ref 11.7–15.4)
WBC: 9.6 10*3/uL (ref 3.4–10.8)

## 2021-09-11 ENCOUNTER — Other Ambulatory Visit: Payer: Self-pay

## 2021-09-11 ENCOUNTER — Inpatient Hospital Stay (HOSPITAL_COMMUNITY)
Admission: AD | Admit: 2021-09-11 | Discharge: 2021-09-14 | DRG: 787 | Disposition: A | Discharge status: Home / Self Care | Payer: Medicaid Other | Attending: Family Medicine | Admitting: Family Medicine

## 2021-09-11 ENCOUNTER — Encounter (HOSPITAL_COMMUNITY): Payer: Self-pay | Admitting: Obstetrics & Gynecology

## 2021-09-11 DIAGNOSIS — O403XX0 Polyhydramnios, third trimester, not applicable or unspecified: Secondary | ICD-10-CM | POA: Diagnosis present

## 2021-09-11 DIAGNOSIS — R030 Elevated blood-pressure reading, without diagnosis of hypertension: Secondary | ICD-10-CM | POA: Diagnosis present

## 2021-09-11 DIAGNOSIS — O321XX0 Maternal care for breech presentation, not applicable or unspecified: Secondary | ICD-10-CM | POA: Diagnosis present

## 2021-09-11 DIAGNOSIS — Z3A39 39 weeks gestation of pregnancy: Secondary | ICD-10-CM | POA: Diagnosis not present

## 2021-09-11 DIAGNOSIS — D62 Acute posthemorrhagic anemia: Secondary | ICD-10-CM | POA: Diagnosis not present

## 2021-09-11 DIAGNOSIS — O368191 Decreased fetal movements, unspecified trimester, fetus 1: Secondary | ICD-10-CM | POA: Diagnosis not present

## 2021-09-11 DIAGNOSIS — O09299 Supervision of pregnancy with other poor reproductive or obstetric history, unspecified trimester: Secondary | ICD-10-CM

## 2021-09-11 DIAGNOSIS — Z880 Allergy status to penicillin: Secondary | ICD-10-CM

## 2021-09-11 DIAGNOSIS — O368130 Decreased fetal movements, third trimester, not applicable or unspecified: Secondary | ICD-10-CM | POA: Diagnosis present

## 2021-09-11 DIAGNOSIS — O99214 Obesity complicating childbirth: Secondary | ICD-10-CM | POA: Diagnosis present

## 2021-09-11 DIAGNOSIS — O9081 Anemia of the puerperium: Secondary | ICD-10-CM | POA: Diagnosis not present

## 2021-09-11 DIAGNOSIS — O321XX1 Maternal care for breech presentation, fetus 1: Secondary | ICD-10-CM | POA: Diagnosis not present

## 2021-09-11 DIAGNOSIS — O403XX1 Polyhydramnios, third trimester, fetus 1: Secondary | ICD-10-CM | POA: Diagnosis not present

## 2021-09-11 DIAGNOSIS — O26893 Other specified pregnancy related conditions, third trimester: Secondary | ICD-10-CM | POA: Diagnosis present

## 2021-09-11 DIAGNOSIS — Z3483 Encounter for supervision of other normal pregnancy, third trimester: Secondary | ICD-10-CM | POA: Diagnosis not present

## 2021-09-11 DIAGNOSIS — Z3480 Encounter for supervision of other normal pregnancy, unspecified trimester: Secondary | ICD-10-CM

## 2021-09-11 DIAGNOSIS — O368190 Decreased fetal movements, unspecified trimester, not applicable or unspecified: Secondary | ICD-10-CM | POA: Diagnosis present

## 2021-09-11 LAB — CBC
HCT: 35.8 % — ABNORMAL LOW (ref 36.0–46.0)
Hemoglobin: 12.1 g/dL (ref 12.0–15.0)
MCH: 30.3 pg (ref 26.0–34.0)
MCHC: 33.8 g/dL (ref 30.0–36.0)
MCV: 89.7 fL (ref 80.0–100.0)
Platelets: 217 10*3/uL (ref 150–400)
RBC: 3.99 MIL/uL (ref 3.87–5.11)
RDW: 15.4 % (ref 11.5–15.5)
WBC: 9.3 10*3/uL (ref 4.0–10.5)
nRBC: 0 % (ref 0.0–0.2)

## 2021-09-11 LAB — URINALYSIS, ROUTINE W REFLEX MICROSCOPIC
Bilirubin Urine: NEGATIVE
Glucose, UA: NEGATIVE mg/dL
Hgb urine dipstick: NEGATIVE
Ketones, ur: NEGATIVE mg/dL
Nitrite: NEGATIVE
Protein, ur: NEGATIVE mg/dL
Specific Gravity, Urine: 1.005 (ref 1.005–1.030)
pH: 7 (ref 5.0–8.0)

## 2021-09-11 LAB — TYPE AND SCREEN
ABO/RH(D): A POS
Antibody Screen: NEGATIVE

## 2021-09-11 MED ORDER — ONDANSETRON HCL 4 MG/2ML IJ SOLN: 4.0000 mg | Freq: Four times a day (QID) | INTRAMUSCULAR | Status: DC | PRN

## 2021-09-11 MED ORDER — OXYCODONE-ACETAMINOPHEN 5-325 MG PO TABS: 1.0000 | ORAL_TABLET | ORAL | Status: DC | PRN

## 2021-09-11 MED ORDER — LIDOCAINE HCL (PF) 1 % IJ SOLN: 30.0000 mL | INTRAMUSCULAR | Status: DC | PRN

## 2021-09-11 MED ORDER — ACETAMINOPHEN 325 MG PO TABS: 650.0000 mg | ORAL_TABLET | ORAL | Status: DC | PRN

## 2021-09-11 MED ORDER — OXYCODONE-ACETAMINOPHEN 5-325 MG PO TABS: 2.0000 | ORAL_TABLET | ORAL | Status: DC | PRN

## 2021-09-11 MED ORDER — OXYTOCIN-SODIUM CHLORIDE 30-0.9 UT/500ML-% IV SOLN: 2.5000 [IU]/h | INTRAVENOUS | Status: DC

## 2021-09-11 MED ORDER — LACTATED RINGERS IV SOLN: 500.0000 mL | INTRAVENOUS | Status: DC | PRN

## 2021-09-11 MED ORDER — LACTATED RINGERS IV SOLN: INTRAVENOUS | Status: DC

## 2021-09-11 MED ORDER — OXYTOCIN BOLUS FROM INFUSION: 333.0000 mL | Freq: Once | INTRAVENOUS | Status: DC

## 2021-09-11 MED ORDER — SOD CITRATE-CITRIC ACID 500-334 MG/5ML PO SOLN
30.0000 mL | ORAL | Status: DC | PRN
  Administered 2021-09-12: 30 mL via ORAL
  Filled 2021-09-11: qty 30

## 2021-09-11 NOTE — H&P (Signed)
OBSTETRIC ADMISSION HISTORY AND PHYSICAL ? ?Marisa Page is a 26 y.o. female G3P1011 with IUP at [redacted]w[redacted]d by LMP presenting for IOL due to persistent decreased fetal movement at term. She reports no LOF, no VB, no blurry vision, headaches, peripheral edema, or RUQ pain.  She plans on breast feeding. She is planning to use condoms for birth control postpartum.  ? ?She received her prenatal care at [facility] and later transferred to [place].  ? ?Dating: By LMP --->  Estimated Date of Delivery: 09/16/21 ? ?Sono:   ?@[redacted]w[redacted]d , CWD, normal anatomy, cephalic presentation, anterior placental lie, 1156 g, 77% EFW ? ?Prenatal History/Complications:  ?Iron deficiency anemia (last hemoglobin 12.0 on 5/12) ?Elevated BP without diagnosis of HTN (noted on 5/12 in office) ? ?Past Medical History: ?History reviewed. No pertinent past medical history. ? ?Past Surgical History: ?Past Surgical History:  ?Procedure Laterality Date  ? WISDOM TOOTH EXTRACTION    ? ? ?Obstetrical History: ?OB History   ? ? Gravida  ?3  ? Para  ?1  ? Term  ?1  ? Preterm  ?0  ? AB  ?1  ? Living  ?1  ?  ? ? SAB  ?1  ? IAB  ?0  ? Ectopic  ?0  ? Multiple  ?   ? Live Births  ?1  ?   ?  ?  ? ?Social History ?Social History  ? ?Socioeconomic History  ? Marital status: Significant Other  ?  Spouse name: [name]  ? Number of children: 1  ? Years of education: Not on file  ? Highest education level: Some college, no degree  ?Occupational History  ? Occupation: Catering  ?  Comment: Building surveyor  ?Tobacco Use  ? Smoking status: Never  ? Smokeless tobacco: Never  ?Vaping Use  ? Vaping Use: Never used  ?Substance and Sexual Activity  ? Alcohol use: Not Currently  ?  Comment: occ  ? Drug use: Not Currently  ?  Frequency: 14.0 times per week  ?  Types: Marijuana  ?  Comment: Last time 07/2021  ? Sexual activity: Yes  ?  Birth control/protection: None  ?Other Topics Concern  ? Not on file  ?Social History Narrative  ? ** Merged History  Encounter **  ?    ? ?Social Determinants of Health  ? ?Financial Resource Strain: Low Risk   ? Difficulty of Paying Living Expenses: Not hard at all  ?Food Insecurity: No Food Insecurity  ? Worried About Charity fundraiser in the Last Year: Never true  ? Ran Out of Food in the Last Year: Never true  ?Transportation Needs: No Transportation Needs  ? Lack of Transportation (Medical): No  ? Lack of Transportation (Non-Medical): No  ?Physical Activity: Sufficiently Active  ? Days of Exercise per Week: 7 days  ? Minutes of Exercise per Session: 30 min  ?Stress: No Stress Concern Present  ? Feeling of Stress : Not at all  ?Social Connections: Moderately Isolated  ? Frequency of Communication with Friends and Family: More than three times a week  ? Frequency of Social Gatherings with Friends and Family: More than three times a week  ? Attends Religious Services: Never  ? Active Member of Clubs or Organizations: No  ? Attends Archivist Meetings: Never  ? Marital Status: Living with partner  ? ? ?Family History: ?Family History  ?Problem Relation Age of Onset  ? Thyroid disease Mother   ? Diabetes Father   ?  Stroke Father   ? Thyroid disease Maternal Aunt   ? Cancer Maternal Grandmother   ? ? ?Allergies: ?Allergies  ?Allergen Reactions  ? Penicillins Hives  ?  Has patient had a PCN reaction causing immediate rash, facial/tongue/throat swelling, SOB or lightheadedness with hypotension: Unknown ?Has patient had a PCN reaction causing severe rash involving mucus membranes or skin necrosis: Unknown ?Has patient had a PCN reaction that required hospitalization: No ?Has patient had a PCN reaction occurring within the last 10 years: No ?If all of the above answers are "NO", then may proceed with Cephalosporin use. ?  ? ? ?Medications Prior to Admission  ?Medication Sig Dispense Refill Last Dose  ? ferrous sulfate 325 (65 FE) MG tablet Take 1 tablet (325 mg total) by mouth daily with breakfast. 30 tablet 3 Past Week  ?  Prenatal Vit-Fe Fumarate-FA (WESTAB PLUS) 27-1 MG TABS Take 1 tablet by mouth daily.   Past Week  ? ? ? ?Review of Systems  ?All systems reviewed and negative except as stated in HPI ? ?Blood pressure 129/69, pulse 63, temperature 97.8 ?F (36.6 ?C), temperature source Axillary, resp. rate 18, height 5\' 8"  (1.727 m), weight 117 kg, last menstrual period 12/10/2020, SpO2 100 %, unknown if currently breastfeeding. ? ?General appearance: alert, cooperative, and no distress ?Lungs: normal work of breathing on room air  ?Heart: normal rate, warm and well perfused  ?Abdomen: soft, non-tender, gravid  ?Extremities: no LE edema or calf tenderness to palpation  ? ?Presentation: Breech by BSUS ?Fetal monitoring: Baseline 125 bpm, moderate variability, + accels, no decels  ?Uterine activity: Occasional contractions  ?Dilation: Closed ?Exam by:: Threasa Beards, CNM ? ?Prenatal labs: ?ABO, Rh: --/--/A POS (05/14 2122) ?Antibody: NEG (05/14 2122) ?Rubella: 8.63 (09/19 1155) ?RPR: Non Reactive (03/01 [id number])  ?HBsAg: Negative (09/19 1155)  ?HIV: Non Reactive (03/01 [id number])  ?GBS: --[place] Cloud (04/21 1148)  ?2 hr Glucola normal  ?Genetic screening - LR NIPS, Horizon negative ?Anatomy US normal female  ? ?Prenatal Transfer Tool  ?Maternal Diabetes: No ?Genetic Screening: Normal ?Maternal Ultrasounds/Referrals: Normal ?Fetal Ultrasounds or other Referrals:  None ?Maternal Substance Abuse:  No ?Significant Maternal Medications:  None ?Significant Maternal Lab Results: Group B Strep negative ? ?Results for orders placed or performed during the hospital encounter of 09/11/21 (from the past 24 hour(s))  ?Urinalysis, Routine w reflex microscopic Urine, Clean Catch  ? Collection Time: 09/11/21  8:05 PM  ?Result Value Ref Range  ? Color, Urine STRAW (A) YELLOW  ? APPearance HAZY (A) CLEAR  ? Specific Gravity, Urine 1.005 1.005 - 1.030  ? pH 7.0 5.0 - 8.0  ? Glucose, UA NEGATIVE NEGATIVE mg/dL  ? Hgb urine dipstick NEGATIVE NEGATIVE  ? Bilirubin Urine  NEGATIVE NEGATIVE  ? Ketones, ur NEGATIVE NEGATIVE mg/dL  ? Protein, ur NEGATIVE NEGATIVE mg/dL  ? Nitrite NEGATIVE NEGATIVE  ? Leukocytes,Ua TRACE (A) NEGATIVE  ? RBC / HPF 0-5 0 - 5 RBC/hpf  ? WBC, UA 0-5 0 - 5 WBC/hpf  ? Bacteria, UA RARE (A) NONE SEEN  ? Squamous Epithelial / LPF 0-5 0 - 5  ?CBC  ? Collection Time: 09/11/21  9:22 PM  ?Result Value Ref Range  ? WBC 9.3 4.0 - 10.5 K/uL  ? RBC 3.99 3.87 - 5.11 MIL/uL  ? Hemoglobin 12.1 12.0 - 15.0 g/dL  ? HCT 35.8 (L) 36.0 - 46.0 %  ? MCV 89.7 80.0 - 100.0 fL  ? MCH 30.3 26.0 - 34.0 pg  ? MCHC 33.8 30.0 -  36.0 g/dL  ? RDW 15.4 11.5 - 15.5 %  ? Platelets 217 150 - 400 K/uL  ? nRBC 0.0 0.0 - 0.2 %  ?Type and screen [place]  ? Collection Time: 09/11/21  9:22 PM  ?Result Value Ref Range  ? ABO/RH(D) A POS   ? Antibody Screen NEG   ? Sample Expiration    ?  09/14/2021,2359 ?Performed at [facility], [place] [address] [place], [place] [address] ?  ? ? ?Patient Active Problem List  ? Diagnosis Date Noted  ? Decreased fetal movement 09/11/2021  ? Elevated BP without diagnosis of hypertension 09/09/2021  ? Anemia during pregnancy in third trimester 08/01/2021  ? Supervision of other normal pregnancy, antepartum 01/17/2021  ? H/O miscarriage, currently pregnant 01/17/2021  ? ? ?Assessment/Plan:  ?Marisa Page is a 26 y.o. G3P1011 at [redacted]w[redacted]d here for IOL due to DFM at term. ? ?#Labor: Patient noted to be breech on BSUS upon admission. Dr. Rip Harbour discussed options with patient at bedside including ECV, vaginal breech delivery, and primary cesarean section. Patient would like to try some stretches and position changes on her own with her doula over the next 2-4 hours first to try and get her baby to flip on his own. Will plan to reassess after this time and discuss options again at that time.  ?#Pain: PRN; would like to avoid medications/epidural  ?#FWB: Cat 1, reactive  ?#ID:  GBS neg ?#MOF: Breast  ?#MOC: Condoms  ?#Circ:  No  ? ?#Elevated  BP without diagnosis of HTN: Noted on 5/12 in office. Labs were normal. Normal BP since admission. Will continue to monitor closely.  ? ?#Iron deficiency anemia in pregnancy: Admit Hgb 12.1.  ? ?Arlie Solomons Al

## 2021-09-11 NOTE — MAU Note (Signed)
Marisa Page is a 26 y.o. at [redacted]w[redacted]d here in MAU reporting: Pt c/o DFM for 1 hour and ctx. No bleeding or LOF.  ?LMP:  ?Onset of complaint: 1 hour ?Pain score: 6/10 ?Vitals:  ? 09/11/21 1927  ?BP: 132/79  ?Pulse: 68  ?Resp: 18  ?Temp: 97.6 ?F (36.4 ?C)  ?SpO2: 100%  ?   ?[id number] ?Lab orders placed from triage: Urinalysis ? ?

## 2021-09-11 NOTE — MAU Provider Note (Signed)
?History  ?  ? ?CSN: [id number] ? ?Arrival date and time: 09/11/21 1918 ? ? Event Date/Time  ? First Provider Initiated Contact with Patient 09/11/21 1958   ?  ? ?Chief Complaint  ?Patient presents with  ? Decreased Fetal Movement  ? ?26 y.o. G3P1011 @39 .2 wks presenting with abdominal pain and decreased FM. Reports decreased FM over the last few days and no FM over the last 2 hours. Abdominal pain started today. Describes as sharp and intermittent. Unsure if the pain is ctx.  ? ?OB History   ? ? Gravida  ?3  ? Para  ?1  ? Term  ?1  ? Preterm  ?0  ? AB  ?1  ? Living  ?1  ?  ? ? SAB  ?1  ? IAB  ?0  ? Ectopic  ?0  ? Multiple  ?   ? Live Births  ?1  ?   ?  ?  ? ? ?History reviewed. No pertinent past medical history. ? ?Past Surgical History:  ?Procedure Laterality Date  ? WISDOM TOOTH EXTRACTION    ? ? ?Family History  ?Problem Relation Age of Onset  ? Thyroid disease Mother   ? Diabetes Father   ? Stroke Father   ? Thyroid disease Maternal Aunt   ? Cancer Maternal Grandmother   ? ? ?Social History  ? ?Tobacco Use  ? Smoking status: Never  ? Smokeless tobacco: Never  ?Vaping Use  ? Vaping Use: Never used  ?Substance Use Topics  ? Alcohol use: Not Currently  ?  Comment: occ  ? Drug use: Not Currently  ?  Frequency: 14.0 times per week  ?  Types: Marijuana  ?  Comment: Last time 07/2021  ? ? ?Allergies:  ?Allergies  ?Allergen Reactions  ? Penicillins Hives  ?  Has patient had a PCN reaction causing immediate rash, facial/tongue/throat swelling, SOB or lightheadedness with hypotension: Unknown ?Has patient had a PCN reaction causing severe rash involving mucus membranes or skin necrosis: Unknown ?Has patient had a PCN reaction that required hospitalization: No ?Has patient had a PCN reaction occurring within the last 10 years: No ?If all of the above answers are "NO", then may proceed with Cephalosporin use. ?  ? ? ?Medications Prior to Admission  ?Medication Sig Dispense Refill Last Dose  ? ferrous sulfate 325 (65 FE) MG  tablet Take 1 tablet (325 mg total) by mouth daily with breakfast. 30 tablet 3 Past Week  ? Prenatal Vit-Fe Fumarate-FA (WESTAB PLUS) 27-1 MG TABS Take 1 tablet by mouth daily.   Past Week  ? ? ?Review of Systems  ?Gastrointestinal:  Positive for abdominal pain.  ?Genitourinary:  Negative for vaginal bleeding and vaginal discharge.  ?Physical Exam  ? ?Blood pressure 132/79, pulse 68, temperature 97.6 ?F (36.4 ?C), temperature source Oral, resp. rate 18, height 5\' 8"  (1.727 m), weight 117 kg, last menstrual period 12/10/2020, SpO2 100 %, unknown if currently breastfeeding. ? ?Physical Exam ?Constitutional:   ?   General: She is not in acute distress. ?   Appearance: Normal appearance.  ?HENT:  ?   Head: Normocephalic and atraumatic.  ?Cardiovascular:  ?   Rate and Rhythm: Normal rate.  ?Pulmonary:  ?   Effort: Pulmonary effort is normal. No respiratory distress.  ?Abdominal:  ?   Palpations: Abdomen is soft.  ?   Tenderness: There is no abdominal tenderness.  ?Genitourinary: ?   Comments: VE: closed/thick ?Musculoskeletal:     ?   General:  Normal range of motion.  ?   Cervical back: Normal range of motion.  ?Skin: ?   General: Skin is warm and dry.  ?Neurological:  ?   General: No focal deficit present.  ?   Mental Status: She is alert and oriented to person, place, and time.  ?Psychiatric:     ?   Mood and Affect: Mood normal.     ?   Behavior: Behavior normal.  ?EFM: 125 bpm, mod variability, + accels, no decels ?Toco: rare ? ?Results for orders placed or performed during the hospital encounter of 09/11/21 (from the past 24 hour(s))  ?Urinalysis, Routine w reflex microscopic Urine, Clean Catch     Status: Abnormal  ? Collection Time: 09/11/21  8:05 PM  ?Result Value Ref Range  ? Color, Urine STRAW (A) YELLOW  ? APPearance HAZY (A) CLEAR  ? Specific Gravity, Urine 1.005 1.005 - 1.030  ? pH 7.0 5.0 - 8.0  ? Glucose, UA NEGATIVE NEGATIVE mg/dL  ? Hgb urine dipstick NEGATIVE NEGATIVE  ? Bilirubin Urine NEGATIVE  NEGATIVE  ? Ketones, ur NEGATIVE NEGATIVE mg/dL  ? Protein, ur NEGATIVE NEGATIVE mg/dL  ? Nitrite NEGATIVE NEGATIVE  ? Leukocytes,Ua TRACE (A) NEGATIVE  ? RBC / HPF 0-5 0 - 5 RBC/hpf  ? WBC, UA 0-5 0 - 5 WBC/hpf  ? Bacteria, UA RARE (A) NONE SEEN  ? Squamous Epithelial / LPF 0-5 0 - 5  ? ?MAU Course  ?Procedures ? ?MDM ?Feeling more FM since EFM applied but still not as usual for her baby. No signs of labor. Plan for admit. Bedside US: breech, head to maternal right. Pt informed. Dr. Alysia Penna to bedside to discuss options with pt.  ? ?Assessment and Plan  ?[redacted] weeks gestation ?Reactive NST ?Decreased FM ?Admit to LD ?Mngt per Dr. Mathis Fare and Dr. Alysia Penna ? ?Donette Larry, CNM ?09/11/2021, 8:08 PM  ?

## 2021-09-12 ENCOUNTER — Inpatient Hospital Stay (HOSPITAL_COMMUNITY): Payer: Medicaid Other | Admitting: Anesthesiology

## 2021-09-12 ENCOUNTER — Encounter (HOSPITAL_BASED_OUTPATIENT_CLINIC_OR_DEPARTMENT_OTHER): Payer: Self-pay

## 2021-09-12 ENCOUNTER — Other Ambulatory Visit: Payer: Self-pay

## 2021-09-12 ENCOUNTER — Encounter (HOSPITAL_COMMUNITY)
Admission: AD | Disposition: A | Discharge status: Home / Self Care | Payer: Self-pay | Source: Home / Self Care | Attending: Family Medicine

## 2021-09-12 ENCOUNTER — Encounter (HOSPITAL_COMMUNITY): Payer: Self-pay | Admitting: Obstetrics & Gynecology

## 2021-09-12 ENCOUNTER — Inpatient Hospital Stay (HOSPITAL_BASED_OUTPATIENT_CLINIC_OR_DEPARTMENT_OTHER): Payer: Medicaid Other

## 2021-09-12 ENCOUNTER — Ambulatory Visit (HOSPITAL_BASED_OUTPATIENT_CLINIC_OR_DEPARTMENT_OTHER): Payer: Medicaid Other

## 2021-09-12 DIAGNOSIS — O368130 Decreased fetal movements, third trimester, not applicable or unspecified: Secondary | ICD-10-CM

## 2021-09-12 DIAGNOSIS — Z3A39 39 weeks gestation of pregnancy: Secondary | ICD-10-CM

## 2021-09-12 DIAGNOSIS — O403XX0 Polyhydramnios, third trimester, not applicable or unspecified: Secondary | ICD-10-CM | POA: Diagnosis not present

## 2021-09-12 DIAGNOSIS — O321XX0 Maternal care for breech presentation, not applicable or unspecified: Secondary | ICD-10-CM

## 2021-09-12 DIAGNOSIS — O368191 Decreased fetal movements, unspecified trimester, fetus 1: Secondary | ICD-10-CM | POA: Diagnosis not present

## 2021-09-12 DIAGNOSIS — Z3483 Encounter for supervision of other normal pregnancy, third trimester: Secondary | ICD-10-CM | POA: Diagnosis not present

## 2021-09-12 DIAGNOSIS — O403XX1 Polyhydramnios, third trimester, fetus 1: Secondary | ICD-10-CM | POA: Diagnosis not present

## 2021-09-12 DIAGNOSIS — O321XX1 Maternal care for breech presentation, fetus 1: Secondary | ICD-10-CM | POA: Diagnosis not present

## 2021-09-12 LAB — CBC
HCT: 29.8 % — ABNORMAL LOW (ref 36.0–46.0)
Hemoglobin: 10 g/dL — ABNORMAL LOW (ref 12.0–15.0)
MCH: 30.5 pg (ref 26.0–34.0)
MCHC: 33.6 g/dL (ref 30.0–36.0)
MCV: 90.9 fL (ref 80.0–100.0)
Platelets: 162 10*3/uL (ref 150–400)
RBC: 3.28 MIL/uL — ABNORMAL LOW (ref 3.87–5.11)
RDW: 15.4 % (ref 11.5–15.5)
WBC: 9.3 10*3/uL (ref 4.0–10.5)
nRBC: 0 % (ref 0.0–0.2)

## 2021-09-12 LAB — RPR: RPR Ser Ql: NONREACTIVE

## 2021-09-12 SURGERY — Surgical Case
Anesthesia: Spinal

## 2021-09-12 MED ORDER — FENTANYL CITRATE (PF) 100 MCG/2ML IJ SOLN
INTRAMUSCULAR | Status: DC | PRN
Start: 2021-09-12 — End: 2021-09-12
  Administered 2021-09-12: 15 ug via INTRATHECAL

## 2021-09-12 MED ORDER — SODIUM CHLORIDE 0.9% FLUSH: 3.0000 mL | INTRAVENOUS | Status: DC | PRN

## 2021-09-12 MED ORDER — FENTANYL CITRATE (PF) 100 MCG/2ML IJ SOLN: 25.0000 ug | INTRAMUSCULAR | Status: DC | PRN

## 2021-09-12 MED ORDER — SCOPOLAMINE 1 MG/3DAYS TD PT72
MEDICATED_PATCH | TRANSDERMAL | Status: AC
Start: 2021-09-12 — End: ?
  Filled 2021-09-12: qty 1

## 2021-09-12 MED ORDER — MORPHINE SULFATE (PF) 0.5 MG/ML IJ SOLN
INTRAMUSCULAR | Status: DC | PRN
  Administered 2021-09-12: 150 ug via INTRATHECAL

## 2021-09-12 MED ORDER — SIMETHICONE 80 MG PO CHEW: 80.0000 mg | CHEWABLE_TABLET | ORAL | Status: DC | PRN

## 2021-09-12 MED ORDER — SODIUM CHLORIDE 0.9 % IR SOLN
Status: DC | PRN
  Administered 2021-09-12: 1

## 2021-09-12 MED ORDER — COCONUT OIL OIL
1.0000 "application " | TOPICAL_OIL | Status: DC | PRN
  Administered 2021-09-13: 1 via TOPICAL

## 2021-09-12 MED ORDER — OXYTOCIN-SODIUM CHLORIDE 30-0.9 UT/500ML-% IV SOLN
INTRAVENOUS | Status: AC
Start: 2021-09-12 — End: ?
  Filled 2021-09-12: qty 500

## 2021-09-12 MED ORDER — DIPHENHYDRAMINE HCL 25 MG PO CAPS: 25.0000 mg | ORAL_CAPSULE | ORAL | Status: DC | PRN

## 2021-09-12 MED ORDER — IBUPROFEN 600 MG PO TABS
600.0000 mg | ORAL_TABLET | Freq: Four times a day (QID) | ORAL | Status: DC
  Administered 2021-09-13 – 2021-09-14 (×4): 600 mg via ORAL
  Filled 2021-09-12 (×3): qty 1

## 2021-09-12 MED ORDER — SENNOSIDES-DOCUSATE SODIUM 8.6-50 MG PO TABS
2.0000 | ORAL_TABLET | Freq: Every day | ORAL | Status: DC
  Administered 2021-09-13: 2 via ORAL
  Filled 2021-09-12 (×2): qty 2

## 2021-09-12 MED ORDER — NALOXONE HCL 4 MG/10ML IJ SOLN: 1.0000 ug/kg/h | INTRAVENOUS | Status: DC | PRN

## 2021-09-12 MED ORDER — ACETAMINOPHEN 10 MG/ML IV SOLN
INTRAVENOUS | Status: AC
Start: 2021-09-12 — End: ?
  Filled 2021-09-12: qty 100

## 2021-09-12 MED ORDER — PHENYLEPHRINE HCL-NACL 20-0.9 MG/250ML-% IV SOLN
INTRAVENOUS | Status: DC | PRN
Start: 2021-09-12 — End: 2021-09-12
  Administered 2021-09-12: 60 ug/min via INTRAVENOUS

## 2021-09-12 MED ORDER — ACETAMINOPHEN 500 MG PO TABS
1000.0000 mg | ORAL_TABLET | Freq: Four times a day (QID) | ORAL | Status: DC
  Administered 2021-09-13 – 2021-09-14 (×7): 1000 mg via ORAL
  Filled 2021-09-12 (×7): qty 2

## 2021-09-12 MED ORDER — OXYTOCIN-SODIUM CHLORIDE 30-0.9 UT/500ML-% IV SOLN
2.5000 [IU]/h | INTRAVENOUS | Status: AC
  Administered 2021-09-12: 2.5 [IU]/h via INTRAVENOUS
  Filled 2021-09-12: qty 500

## 2021-09-12 MED ORDER — ENOXAPARIN SODIUM 60 MG/0.6ML IJ SOSY
60.0000 mg | PREFILLED_SYRINGE | INTRAMUSCULAR | Status: DC
  Administered 2021-09-13: 60 mg via SUBCUTANEOUS
  Filled 2021-09-12: qty 0.6

## 2021-09-12 MED ORDER — FENTANYL CITRATE (PF) 100 MCG/2ML IJ SOLN
INTRAMUSCULAR | Status: AC
  Filled 2021-09-12: qty 2

## 2021-09-12 MED ORDER — VANCOMYCIN HCL 1.5 G IV SOLR
1500.0000 mg | Freq: Once | INTRAVENOUS | Status: AC
  Administered 2021-09-12: 1500 mg via INTRAVENOUS
  Filled 2021-09-12: qty 30

## 2021-09-12 MED ORDER — EPHEDRINE SULFATE-NACL 50-0.9 MG/10ML-% IV SOSY
PREFILLED_SYRINGE | INTRAVENOUS | Status: DC | PRN
  Administered 2021-09-12: 10 mg via INTRAVENOUS

## 2021-09-12 MED ORDER — SOD CITRATE-CITRIC ACID 500-334 MG/5ML PO SOLN: 30.0000 mL | ORAL | Status: DC

## 2021-09-12 MED ORDER — KETOROLAC TROMETHAMINE 30 MG/ML IJ SOLN
30.0000 mg | Freq: Four times a day (QID) | INTRAMUSCULAR | Status: DC | PRN
  Administered 2021-09-12: 30 mg via INTRAVENOUS

## 2021-09-12 MED ORDER — ACETAMINOPHEN 10 MG/ML IV SOLN
1000.0000 mg | Freq: Once | INTRAVENOUS | Status: DC | PRN
  Administered 2021-09-12: 1000 mg via INTRAVENOUS

## 2021-09-12 MED ORDER — DIBUCAINE (PERIANAL) 1 % EX OINT: 1.0000 "application " | TOPICAL_OINTMENT | CUTANEOUS | Status: DC | PRN

## 2021-09-12 MED ORDER — BUPIVACAINE IN DEXTROSE 0.75-8.25 % IT SOLN
INTRATHECAL | Status: DC | PRN
  Administered 2021-09-12: 1.6 mL via INTRATHECAL

## 2021-09-12 MED ORDER — OXYCODONE HCL 5 MG PO TABS
5.0000 mg | ORAL_TABLET | Freq: Four times a day (QID) | ORAL | Status: DC | PRN
  Administered 2021-09-13 – 2021-09-14 (×2): 5 mg via ORAL
  Filled 2021-09-12 (×2): qty 1

## 2021-09-12 MED ORDER — OXYTOCIN-SODIUM CHLORIDE 30-0.9 UT/500ML-% IV SOLN
INTRAVENOUS | Status: DC | PRN
  Administered 2021-09-12: 300 mL via INTRAVENOUS

## 2021-09-12 MED ORDER — ACETAMINOPHEN 500 MG PO TABS: 1000.0000 mg | ORAL_TABLET | Freq: Four times a day (QID) | ORAL | Status: DC

## 2021-09-12 MED ORDER — EPHEDRINE 5 MG/ML INJ
INTRAVENOUS | Status: AC
  Filled 2021-09-12: qty 5

## 2021-09-12 MED ORDER — ONDANSETRON HCL 4 MG/2ML IJ SOLN: 4.0000 mg | Freq: Three times a day (TID) | INTRAMUSCULAR | Status: DC | PRN

## 2021-09-12 MED ORDER — MORPHINE SULFATE (PF) 0.5 MG/ML IJ SOLN
INTRAMUSCULAR | Status: AC
  Filled 2021-09-12: qty 10

## 2021-09-12 MED ORDER — KETOROLAC TROMETHAMINE 30 MG/ML IJ SOLN: 30.0000 mg | Freq: Four times a day (QID) | INTRAMUSCULAR | Status: DC | PRN

## 2021-09-12 MED ORDER — WITCH HAZEL-GLYCERIN EX PADS: 1.0000 "application " | MEDICATED_PAD | CUTANEOUS | Status: DC | PRN

## 2021-09-12 MED ORDER — ONDANSETRON HCL 4 MG/2ML IJ SOLN
INTRAMUSCULAR | Status: DC | PRN
  Administered 2021-09-12: 4 mg via INTRAVENOUS

## 2021-09-12 MED ORDER — TETANUS-DIPHTH-ACELL PERTUSSIS 5-2.5-18.5 LF-MCG/0.5 IM SUSY: 0.5000 mL | PREFILLED_SYRINGE | Freq: Once | INTRAMUSCULAR | Status: DC

## 2021-09-12 MED ORDER — DIPHENHYDRAMINE HCL 25 MG PO CAPS: 25.0000 mg | ORAL_CAPSULE | Freq: Four times a day (QID) | ORAL | Status: DC | PRN

## 2021-09-12 MED ORDER — DIPHENHYDRAMINE HCL 50 MG/ML IJ SOLN: 12.5000 mg | INTRAMUSCULAR | Status: DC | PRN

## 2021-09-12 MED ORDER — NALOXONE HCL 0.4 MG/ML IJ SOLN: 0.4000 mg | INTRAMUSCULAR | Status: DC | PRN

## 2021-09-12 MED ORDER — KETOROLAC TROMETHAMINE 30 MG/ML IJ SOLN
INTRAMUSCULAR | Status: AC
Start: 2021-09-12 — End: ?
  Filled 2021-09-12: qty 1

## 2021-09-12 MED ORDER — PHENYLEPHRINE HCL-NACL 20-0.9 MG/250ML-% IV SOLN
INTRAVENOUS | Status: AC
  Filled 2021-09-12: qty 250

## 2021-09-12 MED ORDER — KETOROLAC TROMETHAMINE 30 MG/ML IJ SOLN
30.0000 mg | Freq: Four times a day (QID) | INTRAMUSCULAR | Status: AC
  Administered 2021-09-12 – 2021-09-13 (×2): 30 mg via INTRAVENOUS
  Filled 2021-09-12 (×3): qty 1

## 2021-09-12 MED ORDER — ONDANSETRON HCL 4 MG/2ML IJ SOLN
INTRAMUSCULAR | Status: AC
  Filled 2021-09-12: qty 2

## 2021-09-12 MED ORDER — MENTHOL 3 MG MT LOZG: 1.0000 | LOZENGE | OROMUCOSAL | Status: DC | PRN

## 2021-09-12 MED ORDER — PRENATAL MULTIVITAMIN CH
1.0000 | ORAL_TABLET | Freq: Every day | ORAL | Status: DC
  Administered 2021-09-13 – 2021-09-14 (×2): 1 via ORAL
  Filled 2021-09-12 (×2): qty 1

## 2021-09-12 MED ORDER — SCOPOLAMINE 1 MG/3DAYS TD PT72
1.0000 | MEDICATED_PATCH | Freq: Once | TRANSDERMAL | Status: DC
  Administered 2021-09-12: 1.5 mg via TRANSDERMAL

## 2021-09-12 MED ORDER — ZOLPIDEM TARTRATE 5 MG PO TABS: 5.0000 mg | ORAL_TABLET | Freq: Every evening | ORAL | Status: DC | PRN

## 2021-09-12 MED ORDER — GENTAMICIN SULFATE 40 MG/ML IJ SOLN
5.0000 mg/kg | INTRAVENOUS | Status: AC
  Administered 2021-09-12: 590 mg via INTRAVENOUS
  Filled 2021-09-12: qty 14.75

## 2021-09-12 MED ORDER — SIMETHICONE 80 MG PO CHEW
80.0000 mg | CHEWABLE_TABLET | Freq: Three times a day (TID) | ORAL | Status: DC
  Administered 2021-09-13 (×3): 80 mg via ORAL
  Filled 2021-09-12 (×4): qty 1

## 2021-09-12 MED ORDER — STERILE WATER FOR IRRIGATION IR SOLN
Status: DC | PRN
  Administered 2021-09-12: 1000 mL

## 2021-09-12 SURGICAL SUPPLY — 38 items
APL SKNCLS STERI-STRIP NONHPOA (GAUZE/BANDAGES/DRESSINGS) ×1
BENZOIN TINCTURE PRP APPL 2/3 (GAUZE/BANDAGES/DRESSINGS) ×1 IMPLANT
CHLORAPREP W/TINT 26ML (MISCELLANEOUS) ×4 IMPLANT
CLAMP CORD UMBIL (MISCELLANEOUS) ×2 IMPLANT
CLOTH BEACON ORANGE TIMEOUT ST (SAFETY) ×2 IMPLANT
DRSG OPSITE POSTOP 4X10 (GAUZE/BANDAGES/DRESSINGS) ×2 IMPLANT
ELECT REM PT RETURN 9FT ADLT (ELECTROSURGICAL) ×2
ELECTRODE REM PT RTRN 9FT ADLT (ELECTROSURGICAL) ×1 IMPLANT
EXTRACTOR VACUUM BELL STYLE (SUCTIONS) IMPLANT
GLOVE BIOGEL PI IND STRL 7.0 (GLOVE) ×2 IMPLANT
GLOVE BIOGEL PI INDICATOR 7.0 (GLOVE) ×2
GLOVE ECLIPSE 7.0 STRL STRAW (GLOVE) ×2 IMPLANT
GOWN STRL REUS W/TWL LRG LVL3 (GOWN DISPOSABLE) ×4 IMPLANT
KIT ABG SYR 3ML LUER SLIP (SYRINGE) ×2 IMPLANT
NDL HYPO 25X5/8 SAFETYGLIDE (NEEDLE) ×1 IMPLANT
NEEDLE HYPO 25X5/8 SAFETYGLIDE (NEEDLE) ×2 IMPLANT
NS IRRIG 1000ML POUR BTL (IV SOLUTION) ×2 IMPLANT
PACK C SECTION WH (CUSTOM PROCEDURE TRAY) ×2 IMPLANT
PAD ABD 7.5X8 STRL (GAUZE/BANDAGES/DRESSINGS) ×1 IMPLANT
PAD OB MATERNITY 4.3X12.25 (PERSONAL CARE ITEMS) ×2 IMPLANT
RTRCTR C-SECT PINK 25CM LRG (MISCELLANEOUS) ×2 IMPLANT
SPONGE GAUZE 4X4 12PLY STER LF (GAUZE/BANDAGES/DRESSINGS) ×2 IMPLANT
STRIP CLOSURE SKIN 1/2X4 (GAUZE/BANDAGES/DRESSINGS) ×1 IMPLANT
SUT MNCRL 0 VIOLET CTX 36 (SUTURE) ×2 IMPLANT
SUT MONOCRYL 0 CTX 36 (SUTURE) ×4
SUT PLAIN 0 NONE (SUTURE) IMPLANT
SUT PLAIN 2 0 (SUTURE)
SUT PLAIN 2 0 XLH (SUTURE) ×1 IMPLANT
SUT PLAIN ABS 2-0 CT1 27XMFL (SUTURE) IMPLANT
SUT VIC AB 0 CTX 36 (SUTURE) ×2
SUT VIC AB 0 CTX36XBRD ANBCTRL (SUTURE) ×1 IMPLANT
SUT VIC AB 2-0 CT1 27 (SUTURE) ×2
SUT VIC AB 2-0 CT1 TAPERPNT 27 (SUTURE) IMPLANT
SUT VIC AB 4-0 KS 27 (SUTURE) ×2 IMPLANT
TAPE CLOTH SURG 4X10 WHT LF (GAUZE/BANDAGES/DRESSINGS) ×1 IMPLANT
TOWEL OR 17X24 6PK STRL BLUE (TOWEL DISPOSABLE) ×2 IMPLANT
TRAY FOLEY W/BAG SLVR 14FR LF (SET/KITS/TRAYS/PACK) IMPLANT
WATER STERILE IRR 1000ML POUR (IV SOLUTION) ×2 IMPLANT

## 2021-09-12 NOTE — Anesthesia Procedure Notes (Signed)
Spinal ? ?Patient location during procedure: OR ?Start time: 09/12/2021 2:48 PM ?End time: 09/12/2021 2:52 PM ?Reason for block: surgical anesthesia ?Staffing ?Performed: anesthesiologist  ?Anesthesiologist: Elmer Picker, MD ?Preanesthetic Checklist ?Completed: patient identified, IV checked, risks and benefits discussed, surgical consent, monitors and equipment checked, pre-op evaluation and timeout performed ?Spinal Block ?Patient position: sitting ?Prep: DuraPrep and site prepped and draped ?Patient monitoring: cardiac monitor, continuous pulse ox and blood pressure ?Approach: midline ?Location: L3-4 ?Injection technique: single-shot ?Needle ?Needle type: Pencan  ?Needle gauge: 24 G ?Needle length: 9 cm ?Assessment ?Sensory level: T6 ?Events: CSF return ?Additional Notes ?Functioning IV was confirmed and monitors were applied. Sterile prep and drape, including hand hygiene and sterile gloves were used. The patient was positioned and the spine was prepped. The skin was anesthetized with lidocaine.  Free flow of clear CSF was obtained prior to injecting local anesthetic into the CSF.  The spinal needle aspirated freely following injection.  The needle was carefully withdrawn.  The patient tolerated the procedure well.  ? ? ? ?

## 2021-09-12 NOTE — Lactation Note (Signed)
This note was copied from a baby's chart. ?Lactation Consultation Note ? ?[name] ?Today's Date: 09/12/2021 ?Reason for consult: Follow-up assessment;Mother's request;Term ?Age:26 hours ?LC entered the room, infant was cuing to breastfeed. ?Per mom, she latched infant earlier but was having pinching when infant latched.  ?LC observed infant's oral cavity , infant does have tight labial ( maxillary) frenulum attached to infant's upper lip to gum as well as short lingual frenulum teeters to bottom of mouth with restricted movement. ?Mom re-latched infant a few times at the breast, LC assisted with flanging top lip outward and postioning infant where mom was comfortable. ?Mom latched infant on her left breast using the football hold position,  infant latched with depth, and was still breastfeeding for 15 minutes when LC left the room.  ?Mom knows if she feels pinching or hear clicking noise to break latch and re-latch infant at the breast. ?Mom knows to call RN/LC for further latch assistance if needed. ?Maternal Data ?Has patient been taught Hand Expression?: Yes ?Does the patient have breastfeeding experience prior to this delivery?: Yes ?How long did the patient breastfeed?: Per mom, she BF her 56 year old for 6 months. ? ?Feeding ?Mother's Current Feeding Choice: Breast Milk ? ?LATCH Score ?Latch: Grasps breast easily, tongue down, lips flanged, rhythmical sucking. ? ?Audible Swallowing: A few with stimulation ? ?Type of Nipple: Everted at rest and after stimulation ? ?Comfort (Breast/Nipple): Soft / non-tender ? ?Hold (Positioning): Assistance needed to correctly position infant at breast and maintain latch. ? ?LATCH Score: 8 ? ? ?Lactation Tools Discussed/Used ?  ? ?Interventions ?Interventions: Skin to skin;Assisted with latch;Breast compression;Adjust position;Support pillows;Position options;Education ? ?Discharge ?  ? ?Consult Status ?Consult Status: Follow-up ?Date:  09/13/21 ?Follow-up type: In-patient ? ? ? ?Danelle Earthly ?09/12/2021, 8:33 PM ? ? ? ?

## 2021-09-12 NOTE — Progress Notes (Signed)
In to speak with patient after unsuccessful ECV attempt, delayed slightly by urgent cesarean I had to complete beforehand.  ? ?Patient in agreement to proceed with primary cesarean for indication of breech presentation and mild oligohydramnios at 39 weeks.  ? ?Patient had several good questions. Would like to encapsulate placenta, discussed they will need to bring a cooler but we can provide ice, also reviewed her GBS status is negative so no contraindication in that regard. Also requested that cord clamping be delayed as long as possible. Reviewed increased risk of maternal blood loss as well as increased risk of infection with prolonging a case but overall both risks are low. Did stress that if bleeding was significant we would not be able to honor this request. She also asked that we drop to the clear drape when baby is born which we will absolutely be able to do.  ? ?The risks of cesarean section discussed with the patient included but were not limited to: bleeding which may require transfusion or reoperation; infection which may require antibiotics; injury to bowel, bladder, ureters or other surrounding organs; injury to the fetus; need for additional procedures including hysterectomy in the event of a life-threatening hemorrhage; placental abnormalities with subsequent pregnancies, incisional problems, thromboembolic phenomenon and other postoperative/anesthesia complications. The patient concurred with the proposed plan, giving informed written consent for the procedure. Patient has been NPO since last night she will remain NPO for procedure. Anesthesia and OR aware. Preoperative prophylactic antibiotics and SCDs ordered on call to the OR.  ? ?2:09 PM ?09/12/21 ? ?Venora Maples, MD/MPH ?Attending Family Medicine Physician, Faculty Practice ?Center for [employer], [facility] ?

## 2021-09-12 NOTE — Discharge Summary (Signed)
? ?  Postpartum Discharge Summary ? ?  ?[name] ?DOB: Apr 09, 1996 ?MRN: [id number] ? ?Date of admission: 09/11/2021 ?Delivery date:09/12/2021  ?Delivering provider: Clarnce Flock  ?Date of discharge: 09/14/2021 ? ?Admitting diagnosis: Decreased fetal movement [O36.8190] ?Intrauterine pregnancy: [redacted]w[redacted]d    ?Secondary diagnosis:  Principal Problem: ?  Cesarean delivery delivered ?Active Problems: ?  Supervision of other normal pregnancy, antepartum ?  Decreased fetal movement ?  Breech presentation, no version ? ?Additional problems: Acute blood loss anemia s/p IV Venofer     ?Discharge diagnosis: Term Pregnancy Delivered                                              ?Post partum procedures: None ?Augmentation: N/A ?Complications: None ? ?Hospital course: Sceduled C/S - 26y.o. yo [id number] admitted to the hospital after she presented for decreased fetal movements at term. Was found to be breech presentation. In order to determine if appropriate candidate for vaginal breech delivery patient received an ultrasound which confirmed breech presentation, with severe polyhydramnios (AFI 33cm), EFW 3633gm. She also had a BPP due to decreased fetal movement which was 6 out of 8 (for breathing). Due to EFW being ~2 lbs larger than previous infant (who was 2750gm) patient was thought to not be an appropriate candidate for a vaginal breech delivery.  ?  ?Patient was offered an ECV and after appropriate time for NPO ECV was attempted by Dr. EuElonda Huskyithout success. (See separate note). At that time a primary cesarean delivery was recommended given persistent breech presentation.  ? 09/11/2021  cesarean section with the following indication: Malpresentation. Delivery details are as follows:  ? ?Membrane Rupture Time/Date: 3:14 PM ,09/12/2021   ?Delivery Method:C-Section, Low Transverse  ?Details of operation can be found in separate operative note.  Patient had an uncomplicated postpartum  course.  Her hemoglobin on POD#1 was 9.1, down from 12.1 post-operatively, for which she received IV Venofer.  She remained asymptomatic from her anemia while inpatient.  She is ambulating, tolerating a regular diet, passing flatus, and urinating well.  Her pain and bleeding are controlled.  She is breastfeeding well.  Patient is discharged home in stable condition on  09/14/21 ?       ?Newborn Data: ?Birth date:09/12/2021  ?Birth time:3:15 PM  ?Gender:Female  ?Living status:Living  ?Apgars:9 ,9  ?Weight:3480 g    ? ?Magnesium Sulfate received: No ?BMZ received: No ?Rhophylac: N/A ?MMR: N/A ?T-DaP: Given prenatally ?Flu: No ?Transfusion: No ? ?Physical exam  ?Vitals:  ? 09/13/21 0730 09/13/21 1428 09/13/21 2000 09/14/21 0434  ?BP: 114/62 110/63 (!) 107/55 (!) 109/58  ?Pulse: 62 60 62 (!) 57  ?Resp: '16 17 16 18  ' ?Temp: 97.7 ?F (36.5 ?C) 97.6 ?F (36.4 ?C) 97.7 ?F (36.5 ?C) 97.8 ?F (36.6 ?C)  ?TempSrc: Oral Oral Oral Oral  ?SpO2: 100% 100%  100%  ?Weight:      ?Height:      ? ?General: alert, cooperative, and no distress ?Lochia: appropriate ?Uterine Fundus: firm and below umbilicus  ?Incision: healing well with no significant drainage, no significant erythema, dressing is clean, dry, and intact ?DVT Evaluation: no LE edema or calf tenderness to palpation  ? ?Labs: ?Lab Results  ?Component Value Date  ? WBC 11.0 (H) 09/13/2021  ? HGB 9.1 (L) 09/13/2021  ? HCT 28.0 (L)  09/13/2021  ? MCV 91.8 09/13/2021  ? PLT 156 09/13/2021  ? ? ?  Latest Ref Rng & Units 09/09/2021  ?  1:45 PM  ?CMP  ?Glucose 70 - 99 mg/dL 96    ?BUN 6 - 20 mg/dL 12    ?Creatinine 0.57 - 1.00 mg/dL 0.93    ?Sodium 134 - 144 mmol/L 137    ?Potassium 3.5 - 5.2 mmol/L 4.4    ?Chloride 96 - 106 mmol/L 104    ?CO2 20 - 29 mmol/L 18    ?Calcium 8.7 - 10.2 mg/dL 10.4    ?Total Protein 6.0 - 8.5 g/dL 6.8    ?Total Bilirubin 0.0 - 1.2 mg/dL <0.2    ?Alkaline Phos 44 - 121 IU/L 141    ?AST 0 - 40 IU/L 14    ?ALT 0 - 32 IU/L 12    ? ?Edinburgh Score: ? ?  09/13/2021  ?   6:37 AM  ?[name] Postnatal Depression Scale Screening Tool  ?I have been able to laugh and see the funny side of things. 0  ?I have looked forward with enjoyment to things. 0  ?I have blamed myself unnecessarily when things went wrong. 2  ?I have been anxious or worried for no good reason. 2  ?I have felt scared or panicky for no good reason. 0  ?Things have been getting on top of me. 1  ?I have been so unhappy that I have had difficulty sleeping. 0  ?I have felt sad or miserable. 0  ?I have been so unhappy that I have been crying. 1  ?The thought of harming myself has occurred to me. 0  ?Edinburgh Postnatal Depression Scale Total 6  ? ? ? ?After visit meds:  ?Allergies as of 09/14/2021   ? ?   Reactions  ? Penicillins Hives  ? Has patient had a PCN reaction causing immediate rash, facial/tongue/throat swelling, SOB or lightheadedness with hypotension: Unknown ?Has patient had a PCN reaction causing severe rash involving mucus membranes or skin necrosis: Unknown ?Has patient had a PCN reaction that required hospitalization: No ?Has patient had a PCN reaction occurring within the last 10 years: No ?If all of the above answers are "NO", then may proceed with Cephalosporin use.  ? ?  ? ?  ?Medication List  ?  ? ?TAKE these medications   ? ?acetaminophen 500 MG tablet ?Commonly known as: TYLENOL ?Take 2 tablets (1,000 mg total) by mouth every 8 (eight) hours as needed (pain). ?  ?ferrous sulfate 325 (65 FE) MG tablet ?Take 1 tablet (325 mg total) by mouth daily with breakfast. ?  ?ibuprofen 600 MG tablet ?Commonly known as: ADVIL ?Take 1 tablet (600 mg total) by mouth every 6 (six) hours as needed (pain). ?  ?oxyCODONE 5 MG immediate release tablet ?Commonly known as: Oxy IR/ROXICODONE ?Take 1 tablet (5 mg total) by mouth every 6 (six) hours as needed for severe pain or breakthrough pain. ?  ?WesTab Plus 27-1 MG Tabs ?Take 1 tablet by mouth daily. ?  ? ?  ? ?  ?  ? ? ?  ?Discharge Care Instructions  ?(From admission,  onward)  ?  ? ? ?  ? ?  Start     Ordered  ? 09/14/21 0000  Discharge wound care:       ?Comments: Remove dressing 5 days after your surgery date. You can then wash the area gently with soap and water and pat dry. You will have an incision check  in about 1 week.  ? 09/14/21 0711  ? ?  ?  ? ?  ? ? ? ?Discharge home in stable condition ?Infant Feeding: Breast ?Infant Disposition: home with mother ?Discharge instruction: per After Visit Summary and Postpartum booklet. ?Activity: Advance as tolerated. Pelvic rest for 6 weeks.  ?Diet: routine diet ?Future Appointments: ?Future Appointments  ?Date Time Provider [place]  ?09/22/2021  2:45 PM Megan Salon, MD [facility]  ?10/25/2021 10:45 AM Leftwich-Kirby, Kathie Dike, CNM [facility]  ? ?Follow up Visit: ?Message sent to [facility] by Dr. Cy Blamer on 5/15 ? ?Please schedule this patient for a In person postpartum visit in 4 weeks with the following provider:  CNM preferred . ?Additional Postpartum F/U: Incision check 1 week  ?High risk pregnancy complicated by:  Unstable lie/breech presentation, severe polyhydramnios ?Delivery mode:  C-Section, Low Transverse  ?Anticipated Birth Control:  Condoms ? ?09/14/2021 ?[name], MD ? ? ? ?

## 2021-09-12 NOTE — Progress Notes (Signed)
Went to bedside to discuss with patient that infant was discovered to be vertex upon hysterotomy. Discussed my best guess is that spinal may have induced enough relaxation to allow baby to turn. Also discussed that given unstable lie, we may have ended up in the same scenario regardless but we'll never know. All questions answered.  ? ?4:49 PM ?09/12/21 ? ?Venora Maples, MD/MPH ?Attending Family Medicine Physician, Faculty Practice ?Center for [employer], [facility] ? ?

## 2021-09-12 NOTE — Progress Notes (Signed)
Came to bedside to discuss current clinical scenario and options available.  ? ?At present patient remains breech, growth US shows infant's EFW is 3634 grams, prior delivery in 2018 was 2750 grams. [place] also shows mild polyhydramnios of 28 cm. Explored patient preferences, she very much desires low intervention vaginal delivery and ideally breech vaginal delivery. Reviewed with patient that in light of her infant being significantly larger than her last in addition to polyhydramnios, this makes her a less than ideal candidate for vaginal breech delivery. We discussed that studies that showed good outcomes still had about a 50% c-section rate and that these studies often excluded infants that were on the larger size or that were significantly larger than prior children, which is the case here. I stressed to patient that I also desire to achieve a low intervention vaginal delivery but do not feel that in her case we can do so safely. In addition at this point in time there are also issues regarding coverage with providers who are comfortable doing breech vaginal delivery.  ? ?I then reviewed her other two options which are attempt ECV followed by induction of labor or proceed to cesarean delivery. Either way with polyhydramnios at 39 weeks delivery is indicated. Patient strongly desires to avoid cesarean. We discussed external cephalic version and the general way the procedure is performed with external manipulation of the fetus to achieve a cephalic presentation. I recommended that we give her terbutaline to help the uterus relax and she was amenable to this plan. We discussed placement of a CSE or straight epidural, she strongly desires to avoid an epidural due to issues with her delivery. I reviewed that there is a higher likelihood of success with an epidural or at least a spinal in place, she understands this and continued to decline. We reviewed that the primary risk of this procedure is fetal intolerance  necessitating an emergency cesarean and its attendant risks. She understood these risks and wishes to proceed.  ? ?We also discussed that should the ECV be successful we will proceed to IOL. We reviewed methods of induction including misoprostol, foley balloon, pitocin, and AROM. Given her desire for low intervention I recommended misoprostol and cautious AROM as best methods to put her into natural labor. We discussed increased risk of tachysystole with misoprostol as well as risks of cord prolapse and infection with AROM, but overall benefits likely outweigh risks for both of these intervention. ? ?Patient also understands that we recommend proceeding to cesarean delivery if ECV is unsuccessful, but we will do everything possible to have a healthy mom, a healthy baby, and a vaginal delivery for this pregnancy.  ? ?Discussed case with Dr. Armond Hang, OK to proceed at 1100 based on last intake of food/water. Given his greater experience and skills in this procedure I requested Dr. Despina Hidden perform the ECV and he is happy to do so, patient is also aware he will be performing the procedure.  ? ?10:16 AM ?09/12/21 ? ?Venora Maples, MD/MPH ?Attending Family Medicine Physician, Faculty Practice ?Center for [employer], [facility] ? ?

## 2021-09-12 NOTE — Lactation Note (Signed)
This note was copied from a baby's chart. ?Lactation Consultation Note ? ?[name] ?Today's Date: 09/12/2021 ?Reason for consult: Initial assessment;Term ?Age:26 hours ?P2, term female infant. ?LC did not see latch, per mom, infant BF well in recovery for 13 minutes and then again for 20 minutes, she doesn't not have any BF concerns at this time. ?Mom knows to breastfeed according to hunger cues, on demand, 8 to 12 times per day.  ?Mom was doing skin to skin when LC entered the room. ?Mom knows she can call RN/LC for latch assistance if needed. ?Mom made aware of O/P services, breastfeeding support groups, community resources, and our phone # for post-discharge questions.   ?Maternal Data ?Has patient been taught Hand Expression?: Yes ?Does the patient have breastfeeding experience prior to this delivery?: Yes ?How long did the patient breastfeed?: Per mom, she BF her 55 year old for 6 months. ? ?Feeding ?Mother's Current Feeding Choice: Breast Milk ? ?LATCH Score ? ? ?Lactation Tools Discussed/Used ?  ? ?Interventions ?Interventions: Breast feeding basics reviewed;Skin to skin;Hand express;Education;LC Services brochure ? ?Discharge ?  ? ?Consult Status ?Consult Status: Follow-up ?Date: 09/13/21 ?Follow-up type: In-patient ? ? ? ?Danelle Earthly ?09/12/2021, 6:36 PM ? ? ? ?

## 2021-09-12 NOTE — Transfer of Care (Signed)
Immediate Anesthesia Transfer of Care Note ? ?Patient: Marisa Page ? ?Procedure(s) Performed: CESAREAN SECTION ? ?Patient Location: PACU ? ?Anesthesia Type:Spinal ? ?Level of Consciousness: awake ? ?Airway & Oxygen Therapy: Patient Spontanous Breathing ? ?Post-op Assessment: Report given to RN and Post -op Vital signs reviewed and stable ? ?Post vital signs: Reviewed and stable ? ?Last Vitals:  ?Vitals Value Taken Time  ?BP 109/54 09/12/21 1608  ?Temp    ?Pulse 59 09/12/21 1612  ?Resp 17 09/12/21 1612  ?SpO2 98 % 09/12/21 1612  ?Vitals shown include unvalidated device data. ? ?Last Pain:  ?Vitals:  ? 09/12/21 0803  ?TempSrc: Oral  ?PainSc: 0-No pain  ?   ? ?  ? ?Complications: No notable events documented. ?

## 2021-09-12 NOTE — Procedures (Signed)
Marisa Page [id number] Estimated Date of Delivery: 09/16/21 now at 102w3d ?I have been asked to assess the patient for external Cephalic version attempt ?Found to be in the variable breech position appears to mostly be in complete breech ?Prodromal labor ?Estimated fetal weight 3634 g ?Placenta is anterior grade 3 ?Fluid is voluminous hydramnios by last ultrasound ?Back is right lateral ?Head is slightly flexed to military ? ?Counseled the patient regarding performing external cephalic version ?Understands the risk of cord entanglement with fetal bradycardic episode resulting in a 10% emergency C-section rate ?She understands the success rate is approximately 50% ?Otherwise she is prepared to do a cesarean section today ?Been discussed with anesthesia ?Anesthesia is aware of her oral intake status and asked that I wait till 11:00 to perform the procedure ? ?All questions were answered for the patient and her Dula ? ?Permit was signed ? ?I made 3 attempts at external cephalic version all of which were unsuccessful ? ?The first attempt because the orientation of the back laterally was a forward roll ?It is in the right upper quadrant breech was floating ?I slowly methodically turned the baby got up to approximately 135 degrees and could not progress further ? ?This changes orientation of the back to back posterior ?Then I tried a cold with the head toward the right which was also unsuccessful ? ?Between all attempts the fetal heart rate was back in the 140s and it was never any fetal bradycardia at all ?He was active even during the version and in between version attempts ? ?Counseling the patient we attempted 1 more time ?Before rolled with the head in the right upper quadrant rolling to the left ?Again I got the baby to about 135 degrees but could go no further and it rolled back to the original position ? ?Again the fetal heart rate was reassuring throughout with no evidence of bradycardia ? ?Patient been  permitted for cesarean section and preparations were made for that ?Dr. Crissie Reese will be performing that was not in the room for the entire procedure ? ?Patient tolerated the procedure well ? ?Lazaro Arms, MD ?09/12/2021 ?11:37 AM ? ?

## 2021-09-12 NOTE — Op Note (Signed)
Operative Note   Patient: Marisa Page  Date of Procedure: 09/12/2021  Procedure: Primary Low Transverse Cesarean   Indications:  breech presentation, unsuccessful ECV, unstable lie with known polyhydramnios  Pre-operative Diagnosis: PRIMARY CESAREAN SECTION BREECH PRESENTATION.   Post-operative Diagnosis: Same  TOLAC Candidate: Yes   Surgeon: Surgeon(s) and Role:    * Eckstat, Mary Sella, MD - Primary    * Warner Mccreedy, MD - Assisting  Assistants:   An experienced assistant was required given the standard of surgical care given the complexity of the case.  This assistant was needed for exposure, dissection, suctioning, retraction, instrument exchange, assisting with delivery with administration of fundal pressure, and for overall help during the procedure.   Anesthesia: spinal  Anesthesiologist: Dr. Armond Hang  Antibiotics: Gentamicin and Vancomycin   Estimated Blood Loss: 748 ml   Total IV Fluids: 2100 ml  Urine Output:  50 cc OF clear urine  Specimens: placenta, intact, family plans to take home  Complications: no complications   Indications: Marisa Page is a 26 y.o. L8V5643 with an IUP [redacted]w[redacted]d presenting for unscheduled cesarean secondary to the indications listed above. Clinical course notable for patient presented for decreased fetal movements at term. Was found to be breech presentation. In order to determine if appropriate candidate for vaginal breech delivery patient received an ultrasound which confirmed breech presentation, with severe polyhydramnios (AFI 33cm), EFW 3633gm. She also had a BPP due to decreased fetal movement which was 6 out of 8 (for breathing). Due to EFW being ~2 lbs larger than previous infant (who was 2750gm) patient was thought to not be an appropriate candidate for a vaginal breech delivery.   Patient was offered an ECV and after appropriate time for NPO ECV was attempted by Dr. Despina Hidden without success. (See separate note).  At that time a primary cesarean delivery was recommended given persistent breech presentation.   The risks of cesarean section discussed with the patient included but were not limited to: bleeding which may require transfusion or reoperation; infection which may require antibiotics; injury to bowel, bladder, ureters or other surrounding organs; injury to the fetus; need for additional procedures including hysterectomy in the event of a life-threatening hemorrhage; placental abnormalities with subsequent pregnancies, incisional problems, thromboembolic phenomenon and other postoperative/anesthesia complications. The patient concurred with the proposed plan, giving informed written consent for the procedure. Patient has been NPO since last night she will remain NPO for procedure. Anesthesia and OR aware. Preoperative prophylactic antibiotics and SCDs ordered on call to the OR.   Findings: Viable infant in cephalic presentation, nuchal x1. Apgars 9,9 , . Weight 3480 g . Clear large amount of amniotic fluid. Normal placenta, three vessel cord. Normal uterus, Normal bilateral fallopian tubes, Normal bilateral ovaries.  Procedure Details: A Time Out was held and the above information confirmed. The patient received intravenous antibiotics and had sequential compression devices applied to her lower extremities preoperatively. The patient was taken back to the operative suite where spinal anesthesia was administered. After induction of anesthesia, the patient was draped and prepped in the usual sterile manner and placed in a dorsal supine position with a leftward tilt. A low transverse skin incision was made with scalpel and carried down through the subcutaneous tissue to the fascia. Fascial incision was made and extended transversely. The fascia was separated from the underlying rectus tissue superiorly and inferiorly. The rectus muscles were separated in the midline bluntly and the peritoneum was entered bluntly. An  Alexis retractor was placed to  aid in visualization of the uterus. A bladder flap was not developed. A low transverse uterine incision was made. There was copious amounts of fluid noted and once the fluid drained the infant was found to be in cephalic position with nuchal cord x1 and was successfully delivered from cephalic presentation, the umbilical cord was clamped after it was done pulsating. Cord ph was not sent, and cord blood was obtained for evaluation. The placenta was removed Intact and appeared normal. The uterine incision was closed with running locked sutures of 0-Monocryl. Due to ongoing bleeding from the hysterotomy figure of eight sutures of 0 Monocryl were placed after which there was excellent hemostasis.. The abdomen and the pelvis were cleared of all clot and debris and the Jon Gills was removed. Hemostasis was confirmed on all surfaces.  The peritoneum was reapproximated using 2-0 vicryl . The fascia was then closed using 0 Vicryl in a running fashion. The subcutaneous layer was reapproximated with plain gut and the skin was closed with a 4-0 vicryl subcuticular stitch. The patient tolerated the procedure well. Sponge, lap, instrument and needle counts were correct x 2. She was taken to the recovery room in stable condition.  Disposition: PACU - hemodynamically stable.    Signed: Warner Mccreedy, MD, MPH [facility])

## 2021-09-12 NOTE — Progress Notes (Signed)
Labor Progress Note [name] Page is a 26 y.o. G3P1011 at [redacted]w[redacted]d who presented for IOL due to DFM at term.  ? ?S: Resting comfortably. No concerns.  ? ?O:  ?BP (!) 116/45   Pulse 69   Temp 97.8 ?F (36.6 ?C) (Axillary)   Resp 18   Ht 5\' 8"  (1.727 m)   Wt 117 kg   LMP 12/10/2020 (Exact Date)   SpO2 99%   BMI 39.23 kg/m?  ? ?EFM: Baseline 125 bpm, moderate variability, + accels, no decels  ?Toco: Irregular contractions  ? ?CVE: Dilation: 1 ?Effacement (%): Thick ?Presentation: Complete Breech ?Exam by:: Dr. Gwenlyn Perking, MD ? ?A&P: 26 y.o. [id number] [redacted]w[redacted]d  ? ?#Labor: Feeling her contractions. 1 cm dilated on exam, unable to tell presenting part. Bedside ultrasound performed and fetal head noted to be in maternal right upper quadrant.  ? ?Discussed again options with patient including primary cesarean section, ECV, and vaginal breech delivery. Patient initially opted to try ECV; however, she then changed her mind and wanted to proceed with vaginal breech delivery since she did not want to be NPO for ECV procedure.  ? ?Discussed risks of vaginal breech delivery, including head entrapment and need for emergent cesarean section. Patient's prior baby was 6 lbs 1 oz at delivery. No recent growth for current pregnancy. This baby feels bigger to patient.  ? ?Discussed with Dr. Rip Harbour. Will obtain growth [place] prior to proceeding with vaginal breech delivery to assess EFW and better determine her chances of having a successful and safe vaginal breech delivery.  ? ?Patient is agreeable to this. All questions and concerns addressed.  ? ?#Pain: Maternal support, doula at bedside  ?#FWB: Cat 1  ?#GBS negative ? ?[name], MD ?4:55 AM ? ?

## 2021-09-12 NOTE — Anesthesia Preprocedure Evaluation (Signed)
Anesthesia Evaluation  ?Patient identified by MRN, date of birth, ID band ?Patient awake ? ? ? ?Reviewed: ?Allergy & Precautions, NPO status , Patient's Chart, lab work & pertinent test results ? ?Airway ?Mallampati: II ? ?TM Distance: >3 FB ?Neck ROM: Full ? ? ? Dental ?no notable dental hx. ? ?  ?Pulmonary ?neg pulmonary ROS,  ?  ?Pulmonary exam normal ?breath sounds clear to auscultation ? ? ? ? ? ? Cardiovascular ?negative cardio ROS ?Normal cardiovascular exam ?Rhythm:Regular Rate:Normal ? ? ?  ?Neuro/Psych ?negative neurological ROS ? negative psych ROS  ? GI/Hepatic ?negative GI ROS, Neg liver ROS,   ?Endo/Other  ?negative endocrine ROSObese BMI 39 ? Renal/GU ?negative Renal ROS  ?negative genitourinary ?  ?Musculoskeletal ?negative musculoskeletal ROS ?(+)  ? Abdominal ?  ?Peds ? Hematology ?negative hematology ROS ?(+)   ?Anesthesia Other Findings ?Primary C/S for breech ? Reproductive/Obstetrics ?(+) Pregnancy ? ?  ? ? ? ? ? ? ? ? ? ? ? ? ? ?  ?  ? ? ? ? ? ? ? ? ?Anesthesia Physical ?Anesthesia Plan ? ?ASA: 2 ? ?Anesthesia Plan: Spinal  ? ?Post-op Pain Management:   ? ?Induction:  ? ?PONV Risk Score and Plan: Treatment may vary due to age or medical condition ? ?Airway Management Planned: Natural Airway ? ?Additional Equipment:  ? ?Intra-op Plan:  ? ?Post-operative Plan:  ? ?Informed Consent: I have reviewed the patients History and Physical, chart, labs and discussed the procedure including the risks, benefits and alternatives for the proposed anesthesia with the patient or authorized representative who has indicated his/her understanding and acceptance.  ? ? ? ?Dental advisory given ? ?Plan Discussed with: CRNA ? ?Anesthesia Plan Comments:   ? ? ? ? ? ? ?Anesthesia Quick Evaluation ? ?

## 2021-09-13 ENCOUNTER — Encounter (HOSPITAL_BASED_OUTPATIENT_CLINIC_OR_DEPARTMENT_OTHER): Payer: Medicaid Other | Admitting: Advanced Practice Midwife

## 2021-09-13 ENCOUNTER — Encounter (HOSPITAL_BASED_OUTPATIENT_CLINIC_OR_DEPARTMENT_OTHER): Payer: Self-pay

## 2021-09-13 LAB — CBC
HCT: 28 % — ABNORMAL LOW (ref 36.0–46.0)
Hemoglobin: 9.1 g/dL — ABNORMAL LOW (ref 12.0–15.0)
MCH: 29.8 pg (ref 26.0–34.0)
MCHC: 32.5 g/dL (ref 30.0–36.0)
MCV: 91.8 fL (ref 80.0–100.0)
Platelets: 156 10*3/uL (ref 150–400)
RBC: 3.05 MIL/uL — ABNORMAL LOW (ref 3.87–5.11)
RDW: 15.4 % (ref 11.5–15.5)
WBC: 11 10*3/uL — ABNORMAL HIGH (ref 4.0–10.5)
nRBC: 0 % (ref 0.0–0.2)

## 2021-09-13 MED ORDER — SODIUM CHLORIDE 0.9 % IV SOLN
500.0000 mg | Freq: Once | INTRAVENOUS | Status: AC
  Administered 2021-09-13: 500 mg via INTRAVENOUS
  Filled 2021-09-13: qty 25

## 2021-09-13 NOTE — Lactation Note (Signed)
This note was copied from a baby's chart. ?Lactation Consultation Note ? ?[name] ?Today's Date: 09/13/2021 ?Reason for consult: Follow-up assessment;Term;Infant weight loss;Other (Comment) (4 % weight loss, LC checked the doc sheets, updated. baby asleep and mom aware to call with feeding cues for assessment . mom mentioned a tongue tie) ?Age:26 hours ? ?Maternal Data ?  ? ?Feeding ?Mother's Current Feeding Choice: Breast Milk ? ?LATCH Score ( Latch score was the last feeding.  ?Latch: Grasps breast easily, tongue down, lips flanged, rhythmical sucking. ? ?Audible Swallowing: A few with stimulation ? ?Type of Nipple: Everted at rest and after stimulation ? ?Comfort (Breast/Nipple): Soft / non-tender ? ?Hold (Positioning): Assistance needed to correctly position infant at breast and maintain latch. ? ?LATCH Score: 8 ? ? ?Lactation Tools Discussed/Used ?  ? ?Interventions ?  ? ?Discharge ?  ? ?Consult Status ?Consult Status: Follow-up ?Date: 09/13/21 ?Follow-up type: In-patient ? ? ? ?Jerlyn Ly Amberlie Gaillard ?09/13/2021, 8:56 AM ? ? ? ?

## 2021-09-13 NOTE — Clinical Social Work Maternal (Signed)
?CLINICAL SOCIAL WORK MATERNAL/CHILD NOTE ? ?Patient Details  ?Name: Marisa Page ?MRN: [id number] ?Date of Birth: 1995/09/08 ? ?Date:  09/13/2021 ? ?Clinical Social Worker Initiating Note:  Kathrin Greathouse, LCSW Date/Time: Initiated:  09/13/21/1230    ? ?Child's Name:    Marisa Page ? ?Biological Parents:  Mother, Father  ? ?Need for Interpreter:  None  ? ?Reason for Referral:  Current Substance Use/Substance Use During Pregnancy  , Behavioral Health Concerns  ? ?Address:  [address] ?[place] [address]  ?  ?Phone number:  [phone] (home)    ? ?Additional phone number:  ? ?Household Members/Support Persons (HM/SP):   Household Member/Support Person 1, Household Member/Support Person 2 ? ? ?HM/SP Name Relationship DOB or Age  ?HM/SP -1 Marisa Page Significant Other June 30[birth year] 1994  ?HM/SP -2 Marisa Page Son October 03, 2016  ?HM/SP -3        ?HM/SP -4        ?HM/SP -5        ?HM/SP -6        ?HM/SP -7        ?HM/SP -8        ? ? ?Natural Supports (not living in the home):  Extended Family  ? ?Professional Supports: None  ? ?Employment: Unemployed (FOB employed [employer])  ? ?Type of Work:    ? ?Education:  Some College  ? ?Homebound arranged:   ? ?Financial Resources:  Medicaid  ? ?Other Resources:  Physicist, medical  , Advance  ? ?Cultural/Religious Considerations Which May Impact Care:   ? ?Strengths:  Ability to meet basic needs  , Home prepared for child  , Pediatrician chosen  ? ?Psychotropic Medications:        ? ?Pediatrician:    [place] area ? ?Pediatrician List:  ? ?[facility]  ?[place]    ?[facility]    ?[facility]    ?[facility]    ?[facility]    ? ? ?Pediatrician Fax Number:   ? ?Risk Factors/Current Problems:  Substance Use    ? ?Cognitive State:  Alert  , Linear Thinking  , Insightful  , Able to Concentrate    ? ?Mood/Affect:  Calm  , Comfortable  , Interested    ? 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 CSW met with MOB to offer support and complete assessment.   ? ?CSW met with MOB at bedside and introduced CSW role. CSW observed MOB in bed, FOB holding the infant and a small child at bedside. CSW offered to return at a different time. MOB welcomed CSW visit and gave CSW permission to share all information in front of significant other and five-year-old son. MOB reported she lives with FOB and her son. MOB identified FOB and her in laws family as supports. MOB reported she is unemployed, and FOB is employed at [employer]. MOB reported she receives [facility] and Supplemental Nutrition Assistance.  ? ?CSW inquired how MOB has felt since giving birth. MOB reported feeling tired. MOB shared that she had a c- section however she did not desire to have a c-section. CSW inquired how MOB felt during the pregnancy. MOB reported that she felt both excited and restless. CSW inquired about mental health. MOB acknowledged that she has experienced anxiety in the past and believes it was situational triggered by stressors involving her mom that is currently being resolved through therapy. MOB reported she sees therapist Osker Mason  through the [employer]. MOB reported her next visit is Sep 19, 2021. 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 MOB reported she was able to cope by distracting herself and actively being a mom to her child. CSW encouraged MOB to continue using her therapist and reach out to her medical provider if concerns arise. MOB reported understanding.  ? ?CSW provided education regarding the baby blues period vs. perinatal mood disorders. CSW recommended MOB complete a self-evaluation during the postpartum time period using the New Mom Checklist from Postpartum Progress and encouraged MOB to contact a medical professional if  symptoms are noted at any time.   ? ?CSW inquired about MOB substance use during the pregnancy. 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 MOB reported she had pain everywhere including in her back and hips. MOB reported she stopped using in the third trimester mid-April. CSW informed MOB about the hospital drug screen policy. MOB made aware that CSW will monitor the infant CDS and make a report to CPS, if warranted. MOB reported understanding. CSW inquired about CPS history. MOB reported that had [facility] CPS involvement in 2018 after her son was positive for marijuana at birth. MOB reported CPS came out to her home and closed the case after two weeks. CSW offered MOB resources for substance use. MOB declined resources.  ? ?MOB reported that she has items for the infant including a bassinet where the infant will sleep. MOB has chosen [facility] for the infant's follow up care. CSW provided review of Sudden Infant Death Syndrome (SIDS) precautions. MOB reported that she and FOB did a lot of clothes washing with her younger child to eliminate smoke before handling the baby and will do the same with this baby. CSW assessed MOB for additional needs. MOB reported no further need.  ? ?CSW identifies no further need for intervention and no barriers to discharge at this time.  ? ?CSW Plan/Description:  Sudden Infant Death Syndrome (SIDS) Education, CSW Will Continue to Monitor Umbilical Cord Tissue Drug Screen Results and Make Report if Warranted, [place], Perinatal Mood and Anxiety Disorder (PMADs) Education, No Further Intervention Required/No Barriers to Discharge  ? ? ?Lia Hopping, LCSW ?09/13/2021, 4:23 PM ? ?

## 2021-09-13 NOTE — Lactation Note (Signed)
This note was copied from a baby's chart. ?Lactation Consultation Note ? ?[name] ?Today's Date: 09/13/2021 ?Reason for consult: Follow-up assessment;Term;Infant weight loss;Other (Comment) (4 % wt loss. Mom had called prior to the 1400 feeding + the LC was tied up with another patient. Whe LC visited mom , baby had just finished feeding 20 mins. Mom plans to call for assist  to latch- Rt. Br. due to pinching. LC unable to asess baby asleep) ?Age:68 hours ?Mom will page with feeding cues.  ?Maternal Data ?  ? ?Feeding ?Mother's Current Feeding Choice: Breast Milk ? ?LATCH Score ?  ? ?  ? ?  ? ?  ? ?  ? ?  ? ? ?Lactation Tools Discussed/Used ?  ? ?Interventions ?Interventions: Breast feeding basics reviewed;Education ? ?Discharge ?  ? ?Consult Status ?Consult Status: Follow-up ?Date: 09/13/21 ?Follow-up type: In-patient ? ? ? ?Matilde Sprang Brittley Regner ?09/13/2021, 2:39 PM ? ? ? ?

## 2021-09-13 NOTE — Lactation Note (Signed)
This note was copied from a baby's chart. ?Lactation Consultation Note ? ?[name] ?Today's Date: 09/13/2021 ?Reason for consult: Follow-up assessment;Mother's request;Difficult latch;Term;Breastfeeding assistance ?Age:26 hours ? ?LC assisted latching infant on right side with signs of milk transfer. Mom felt pinch at first, once we adjusted to get more depth she denied any pain. Infant still latched and nursing at the end of the visit.  ? ?Maternal Data ?  ? ?Feeding ?Mother's Current Feeding Choice: Breast Milk ? ?LATCH Score ?Latch: Repeated attempts needed to sustain latch, nipple held in mouth throughout feeding, stimulation needed to elicit sucking reflex. ? ?Audible Swallowing: Spontaneous and intermittent ? ?Type of Nipple: Everted at rest and after stimulation ? ?Comfort (Breast/Nipple): Soft / non-tender ? ?Hold (Positioning): Assistance needed to correctly position infant at breast and maintain latch. ? ?LATCH Score: 8 ? ? ?Lactation Tools Discussed/Used ?  ? ?Interventions ?Interventions: Assisted with latch;Skin to skin;Breast massage;Breast compression;Adjust position;Support pillows;Position options;Expressed milk;Education;Infant Driven Feeding Algorithm education ? ?Discharge ?  ? ?Consult Status ?Consult Status: Follow-up ?Date: 09/14/21 ?Follow-up type: In-patient ? ? ? ?Taaliyah Delpriore  Nicholson-Springer ?09/13/2021, 4:27 PM ? ? ? ?

## 2021-09-13 NOTE — Anesthesia Postprocedure Evaluation (Signed)
Anesthesia Post Note ? ?Patient: Marisa Page ? ?Procedure(s) Performed: CESAREAN SECTION ? ?  ? ?Patient location during evaluation: PACU ?Anesthesia Type: Spinal ?Level of consciousness: oriented and awake and alert ?Pain management: pain level controlled ?Vital Signs Assessment: post-procedure vital signs reviewed and stable ?Respiratory status: spontaneous breathing, respiratory function stable and patient connected to nasal cannula oxygen ?Cardiovascular status: blood pressure returned to baseline and stable ?Postop Assessment: no headache, no backache and no apparent nausea or vomiting ?Anesthetic complications: no ? ? ?No notable events documented. ? ?Last Vitals:  ?Vitals:  ? 09/13/21 0319 09/13/21 0730  ?BP: 104/64 114/62  ?Pulse: (!) 59 62  ?Resp: 16 16  ?Temp:  36.5 ?C  ?SpO2: 100% 100%  ?  ?Last Pain:  ?Vitals:  ? 09/13/21 0730  ?TempSrc: Oral  ?PainSc: 0-No pain  ? ?Pain Goal:   ? ?  ?  ?  ?  ?  ?  ?  ? ?Karysa Heft L Arlayne Liggins ? ? ? ? ?

## 2021-09-13 NOTE — Progress Notes (Signed)
Post Operative Day 1 ? ?Subjective: ?Doing well. No acute events overnight. Pain is controlled and bleeding is appropriate. She is eating, drinking, and ambulating without issue. Her foley catheter was just removed this morning, she has not yet voided. She is breast feeding which is going well. She has no other concerns at this time. ? ?Objective: ?Blood pressure 104/64, pulse (!) 59, temperature 98.1 ?F (36.7 ?C), temperature source Oral, resp. rate 16, height 5\' 8"  (1.727 m), weight 117 kg, last menstrual period 12/10/2020, SpO2 100 %, unknown if currently breastfeeding. ? ?Physical Exam:  ?General: alert, cooperative, and no distress ?Lochia: appropriate ?Uterine Fundus: firm and below umbilicus  ?Incision: pressure dressing in place, clean, dry, and intact  ?DVT Evaluation: no LE edema or calf tenderness to palpation  ? ?Recent Labs  ?  09/12/21 ?1725 09/13/21 ?0429  ?HGB 10.0* 9.1*  ?HCT 29.8* 28.0*  ? ? ?Assessment/Plan: ?Marisa Page is a 26 y.o. 30 on POD# 1 s/p pLTCS. ? ?Progressing well. Meeting postpartum milestones. VSS. Continue routine postpartum care. ? ?#Acute blood loss anemia: ?- Hgb 12.1 > 10 > 9.1 post-operatively  ?- No symptoms  ?- Will give IV Venofer today  ? ?Feeding: Breast  ?Contraception: Condoms  ?Circumcision: No ? ?Dispo: Plan for discharge on POD#2-3.  ? ? LOS: 2 days  ? ?[id number], MD  ?09/13/2021, 7:12 AM  ? ? ?

## 2021-09-14 MED ORDER — OXYCODONE HCL 5 MG PO TABS: 5.0000 mg | ORAL_TABLET | Freq: Four times a day (QID) | ORAL | 0 refills | Status: DC | PRN

## 2021-09-14 MED ORDER — IBUPROFEN 600 MG PO TABS: 600.0000 mg | ORAL_TABLET | Freq: Four times a day (QID) | ORAL | 0 refills | Status: DC | PRN

## 2021-09-14 MED ORDER — ACETAMINOPHEN 500 MG PO TABS: 1000.0000 mg | ORAL_TABLET | Freq: Three times a day (TID) | ORAL | 0 refills | Status: DC | PRN

## 2021-09-14 NOTE — Lactation Note (Signed)
This note was copied from a baby's chart. ?Lactation Consultation Note ? ?[name] ?Today's Date: 09/14/2021 ?Reason for consult: Follow-up assessment ?Age:26 hours ? ?Discussed breastfeeding and pumping after for extra stimulation.  Reviewed engorgement care and monitoring voids/stools. ? ? ?Feeding ?Mother's Current Feeding Choice: Breast Milk ? ?Interventions ?Interventions: DEBP;Education ? ?Discharge ?Discharge Education: Engorgement and breast care;Warning signs for feeding baby ?Pump: DEBP;Personal (Ameda) ? ?Consult Status ?Consult Status: Complete ?Date: 09/14/21 ? ? ? ?Dahlia Byes Boschen ?09/14/2021, 12:10 PM ? ? ? ?

## 2021-09-16 ENCOUNTER — Encounter (HOSPITAL_BASED_OUTPATIENT_CLINIC_OR_DEPARTMENT_OTHER): Payer: Medicaid Other | Admitting: Obstetrics & Gynecology

## 2021-09-20 ENCOUNTER — Telehealth (HOSPITAL_COMMUNITY): Payer: Self-pay | Admitting: *Deleted

## 2021-09-20 ENCOUNTER — Inpatient Hospital Stay (HOSPITAL_COMMUNITY)
Admission: AD | Admit: 2021-09-20 | Discharge: 2021-09-21 | Disposition: A | Discharge status: Home / Self Care | Payer: Medicaid Other | Attending: Obstetrics & Gynecology | Admitting: Obstetrics & Gynecology

## 2021-09-20 ENCOUNTER — Encounter (HOSPITAL_COMMUNITY): Payer: Self-pay | Admitting: Obstetrics & Gynecology

## 2021-09-20 DIAGNOSIS — O8609 Infection of obstetric surgical wound, other surgical site: Secondary | ICD-10-CM | POA: Diagnosis not present

## 2021-09-20 DIAGNOSIS — Y838 Other surgical procedures as the cause of abnormal reaction of the patient, or of later complication, without mention of misadventure at the time of the procedure: Secondary | ICD-10-CM | POA: Diagnosis not present

## 2021-09-20 DIAGNOSIS — O8600 Infection of obstetric surgical wound, unspecified: Secondary | ICD-10-CM | POA: Diagnosis not present

## 2021-09-20 DIAGNOSIS — T8149XA Infection following a procedure, other surgical site, initial encounter: Secondary | ICD-10-CM | POA: Diagnosis present

## 2021-09-20 MED ORDER — SULFAMETHOXAZOLE-TRIMETHOPRIM 800-160 MG PO TABS
1.0000 | ORAL_TABLET | Freq: Once | ORAL | Status: AC
  Administered 2021-09-21: 1 via ORAL
  Filled 2021-09-20: qty 1

## 2021-09-20 MED ORDER — SULFAMETHOXAZOLE-TRIMETHOPRIM 800-160 MG PO TABS: 1.0000 | ORAL_TABLET | Freq: Two times a day (BID) | ORAL | 0 refills | Status: DC

## 2021-09-20 NOTE — MAU Provider Note (Signed)
History     CSN: [id number]  Arrival date and time: 09/20/21 2123   Event Date/Time   First Provider Initiated Contact with Patient 09/20/21 2251      Chief Complaint  Patient presents with   Post-op Problem  Drainage from Cesarean wound noted this evening at 2000. She had noticed some pulling on the left of the incision no fever no redness. The discharge is pus with blood mixed. F/u appointment with Dr. Hyacinth Meeker is on 5/25. HPI Patient is  OB History     Gravida  3   Para  2   Term  2   Preterm  0   AB  1   Living  2      SAB  1   IAB  0   Ectopic  0   Multiple  0   Live Births  2           History reviewed. No pertinent past medical history.  Past Surgical History:  Procedure Laterality Date   CESAREAN SECTION N/A 09/12/2021   Procedure: CESAREAN SECTION;  Surgeon: Venora Maples, MD;  Location: [facility];  Service: Obstetrics;  Laterality: N/A;   WISDOM TOOTH EXTRACTION      Family History  Problem Relation Age of Onset   Thyroid disease Mother    Diabetes Father    Stroke Father    Thyroid disease Maternal Aunt    Cancer Maternal Grandmother     Social History   Tobacco Use   Smoking status: Never   Smokeless tobacco: Never  Vaping Use   Vaping Use: Never used  Substance Use Topics   Alcohol use: Not Currently    Comment: occ   Drug use: Not Currently    Frequency: 14.0 times per week    Types: Marijuana    Comment: Last time 07/2021    Allergies:  Allergies  Allergen Reactions   Penicillins Hives    Has patient had a PCN reaction causing immediate rash, facial/tongue/throat swelling, SOB or lightheadedness with hypotension: Unknown Has patient had a PCN reaction causing severe rash involving mucus membranes or skin necrosis: Unknown Has patient had a PCN reaction that required hospitalization: No Has patient had a PCN reaction occurring within the last 10 years: No If all of the above answers are "NO", then may proceed  with Cephalosporin use.     Medications Prior to Admission  Medication Sig Dispense Refill Last Dose   ibuprofen (ADVIL) 600 MG tablet Take 1 tablet (600 mg total) by mouth every 6 (six) hours as needed (pain). 40 tablet 0 09/20/2021 at 1000   oxyCODONE (OXY IR/ROXICODONE) 5 MG immediate release tablet Take 1 tablet (5 mg total) by mouth every 6 (six) hours as needed for severe pain or breakthrough pain. 20 tablet 0 09/20/2021 at 1000   acetaminophen (TYLENOL) 500 MG tablet Take 2 tablets (1,000 mg total) by mouth every 8 (eight) hours as needed (pain). 60 tablet 0    ferrous sulfate 325 (65 FE) MG tablet Take 1 tablet (325 mg total) by mouth daily with breakfast. 30 tablet 3    Prenatal Vit-Fe Fumarate-FA (WESTAB PLUS) 27-1 MG TABS Take 1 tablet by mouth daily.       Review of Systems  Constitutional: Negative.   Respiratory: Negative.    Cardiovascular: Negative.   Gastrointestinal:  Positive for abdominal pain (incision).  Musculoskeletal: Negative.   Physical Exam   Blood pressure 130/83, pulse 87, temperature 99.5 F (37.5 C),  resp. rate 17, height 5\' 8"  (1.727 m), weight 108.9 kg, SpO2 100 %, currently breastfeeding.  Physical Exam Vitals and nursing note reviewed. Exam conducted with a chaperone present.  Constitutional:      Appearance: Normal appearance. She is not ill-appearing.  HENT:     Head: Normocephalic and atraumatic.  Cardiovascular:     Rate and Rhythm: Normal rate.  Pulmonary:     Effort: Pulmonary effort is normal.  Abdominal:     General: Abdomen is flat.     Palpations: Abdomen is soft.       Comments: Blood-stained pus expressed at midline with no significant swelling or induration and the 1 cm opening was probed, about 1.5 cm deep  Neurological:     Mental Status: She is alert.  Psychiatric:        Mood and Affect: Mood normal.        Behavior: Behavior normal.    MAU Course  Procedures  MDM moderate  Assessment and Plan  Small wound  abscess draining 1 week post cesarean section. No cellulitis but will add Bactrim for one week. Keep clean and covered with dressing. Follow-up with Dr. is scheduled in 2 days and she should keep the appointment.  Hyacinth Meeker 09/20/2021, 11:30 PM

## 2021-09-20 NOTE — Telephone Encounter (Signed)
Patient voiced no questions or concerns regarding at this time. Patient voiced no questions or concerns regarding infant at this time. Patient reports infant sleeps in a bassinet on his back. RN reviewed ABCs of safe sleep. Patient verbalized understanding. Patient requested a return call tomorrow to complete EPDS. Stated, "I was napping when you called." RN made a note for call back to be attempted on 09/21/21. Erline Levine, RN,  [phone], 09/20/21

## 2021-09-20 NOTE — MAU Note (Signed)
.  Marisa Page is a 26 y.o. who is postpartum c/s from 09/12/21. She is here in MAU reporting that about 2000 when she awoke from a nap her incision had opened and some "pus" was coming out. Has had a headache for 2-3 days but did not think much of it.  LMP: n'a Onset of complaint: tonight Pain score: incision is 6. Headache is 4 Vitals:   09/20/21 2212 09/20/21 2214  BP:  130/83  Pulse: 87   Resp: 17   Temp: 99.5 F (37.5 C)   SpO2: 100%      FHT:n'a Lab orders placed from triage:  none

## 2021-09-21 ENCOUNTER — Telehealth (HOSPITAL_COMMUNITY): Payer: Self-pay | Admitting: *Deleted

## 2021-09-21 NOTE — Progress Notes (Signed)
Written and verbal d/c instructions given and understanding voiced. 

## 2021-09-21 NOTE — Telephone Encounter (Signed)
Opened telephone call by mistake.  Duffy Rhody, RN 09-21-2021 at 11:37am

## 2021-09-22 ENCOUNTER — Encounter (HOSPITAL_BASED_OUTPATIENT_CLINIC_OR_DEPARTMENT_OTHER): Payer: Self-pay | Admitting: Obstetrics & Gynecology

## 2021-09-22 ENCOUNTER — Ambulatory Visit (INDEPENDENT_AMBULATORY_CARE_PROVIDER_SITE_OTHER): Payer: Medicaid Other | Admitting: Obstetrics & Gynecology

## 2021-09-22 VITALS — BP 132/84 | HR 88 | Ht 68.0 in | Wt 234.8 lb

## 2021-09-22 DIAGNOSIS — O8600 Infection of obstetric surgical wound, unspecified: Secondary | ICD-10-CM

## 2021-09-22 MED ORDER — SULFAMETHOXAZOLE-TRIMETHOPRIM 800-160 MG PO TABS: 1.0000 | ORAL_TABLET | Freq: Two times a day (BID) | ORAL | 0 refills | Status: DC

## 2021-09-23 MED ORDER — CEFDINIR 300 MG PO CAPS
300.0000 mg | ORAL_CAPSULE | Freq: Two times a day (BID) | ORAL | 0 refills | Status: DC
Start: 2021-09-23 — End: 2021-09-28

## 2021-09-23 NOTE — Progress Notes (Unsigned)
GYNECOLOGY  VISIT  CC:   post op recheck  HPI: 26 y.o. [id number] Significant Other Black or African American female here for recheck after undergoing cesarean section on 09/20/2021 for some odor with discharge and some drainage from her incision.  Superficial opening of the incision noted with some purulent appearing drainage.  Was started on Bactrim.  Rx not as pharmacy per pt.  Reviewed order and can see that it was sent but not received by the pharmacy.  Checked about safety in breast feeding.  My initial findings show safety but upon further reading, not recommended until baby is 74 month old or greater.  Contacted pt and changed rx to [facility].  Pt has PCN allergy and this was a mild rash with itching and no swelling.  MEDS:   Current Outpatient Medications on File Prior to Visit  Medication Sig Dispense Refill   ibuprofen (ADVIL) 600 MG tablet Take 1 tablet (600 mg total) by mouth every 6 (six) hours as needed (pain). 40 tablet 0   oxyCODONE (OXY IR/ROXICODONE) 5 MG immediate release tablet Take 1 tablet (5 mg total) by mouth every 6 (six) hours as needed for severe pain or breakthrough pain. 20 tablet 0   No current facility-administered medications on file prior to visit.    SH:  Smoking No    PHYSICAL EXAMINATION:    BP 132/84   Pulse 88   Ht 5\' 8"  (1.727 m)   Wt 234 lb 12.8 oz (106.5 kg)   Breastfeeding Yes   BMI 35.70 kg/m     General appearance: alert, cooperative and appears stated age Incisions:  small 2cm opening in midline, beefy red tissue noted, minimal drainage, no odor   Assessment/Plan: 1. Cesarean wound infection - cefdinir (OMNICEF) 300 MG capsule; Take 1 capsule (300 mg total) by mouth 2 (two) times daily.  Dispense: 14 capsule; Refill: 0  - recheck tues next week after holiday weekend.  Precautions given to pt to call or be seen sooner if needed.

## 2021-09-28 ENCOUNTER — Ambulatory Visit (INDEPENDENT_AMBULATORY_CARE_PROVIDER_SITE_OTHER): Payer: Medicaid Other | Admitting: Obstetrics & Gynecology

## 2021-09-28 ENCOUNTER — Encounter (HOSPITAL_BASED_OUTPATIENT_CLINIC_OR_DEPARTMENT_OTHER): Payer: Self-pay | Admitting: Obstetrics & Gynecology

## 2021-09-28 DIAGNOSIS — O8600 Infection of obstetric surgical wound, unspecified: Secondary | ICD-10-CM

## 2021-09-28 MED ORDER — CEFDINIR 300 MG PO CAPS: 300.0000 mg | ORAL_CAPSULE | Freq: Two times a day (BID) | ORAL | 0 refills | Status: DC

## 2021-09-30 NOTE — Progress Notes (Signed)
GYNECOLOGY  VISIT  CC:   post op recheck  HPI: 26 y.o. [id number] Significant Other Black or African American female here for recheck after undergoing cesarean section on 09/12/2020.  Pt was seen in MAU on 5/23 with some superficial incision opening.  She did have some purulent drainage.  Antibiotic sent to pharmacy by Dr. Roselie Awkward but not received by pharmacy.  Pt had follow up two days later with me.  Superficial central opening to wound noted with some purulent drainage noted.  There was some odor present as well.  Pt reports still having drainage but much less.  Odor gone and she does feel much better.  Pain is improved as well.  Denies fever.   MEDS:   Current Outpatient Medications on File Prior to Visit  Medication Sig Dispense Refill   ibuprofen (ADVIL) 600 MG tablet Take 1 tablet (600 mg total) by mouth every 6 (six) hours as needed (pain). 40 tablet 0   oxyCODONE (OXY IR/ROXICODONE) 5 MG immediate release tablet Take 1 tablet (5 mg total) by mouth every 6 (six) hours as needed for severe pain or breakthrough pain. 20 tablet 0   No current facility-administered medications on file prior to visit.    SH:  Smoking No    PHYSICAL EXAMINATION:    BP 125/79 (BP Location: Left Arm, Patient Position: Sitting, Cuff Size: Large)   Pulse (!) 52   Wt 235 lb 12.8 oz (107 kg)   Breastfeeding Yes   BMI 35.85 kg/m     General appearance: alert, cooperative and appears stated age Abdomen: soft, non-tender; bowel sounds normal; no masses,  no organomegaly Incisions:  superficial about 1.5cm opening that cannot be probed to any depth as has closed.  Small second pin-point opening at left edge of incision.  With compression along wound, serous/sanguinous drainage noted.  Much less that last week and no purulence noted.  Odor has resolved.     Chaperone, Octaviano Batty, was present for exam.  Assessment/Plan: 1. Cesarean wound infection - will continue antibiotics for 10 days.  Feel this will close  completely given significant improvement in last five days.  However, if pt is still having any drainage after the weekend, advised to call and be seen again.  Pt advised can tub bath when drainage has fully stopped and incision closed. - cefdinir (OMNICEF) 300 MG capsule; Take 1 capsule (300 mg total) by mouth 2 (two) times daily.  Dispense: 6 capsule; Refill: 0 - WOUND CULTURE

## 2021-10-02 LAB — WOUND CULTURE

## 2021-10-25 ENCOUNTER — Ambulatory Visit (INDEPENDENT_AMBULATORY_CARE_PROVIDER_SITE_OTHER): Payer: Medicaid Other | Admitting: Advanced Practice Midwife

## 2021-10-25 ENCOUNTER — Encounter (HOSPITAL_BASED_OUTPATIENT_CLINIC_OR_DEPARTMENT_OTHER): Payer: Self-pay | Admitting: Advanced Practice Midwife

## 2021-10-25 DIAGNOSIS — O8600 Infection of obstetric surgical wound, unspecified: Secondary | ICD-10-CM

## 2021-10-25 NOTE — Progress Notes (Signed)
Post Partum Visit Note  Marisa Page is a 26 y.o. G39P2012 female who presents for a postpartum visit. She is 6 weeks postpartum following a primary cesarean section.  I have fully reviewed the prenatal and intrapartum course. The delivery was at 39 gestational weeks.  Anesthesia: spinal. Postpartum course has been complicated by incisional infection. Baby is doing well. Baby is feeding by breast. Bleeding no bleeding. Bowel function is normal. Bladder function is normal. Patient is not sexually active. Contraception method is condoms. Postpartum depression screening: negative.   The pregnancy intention screening data noted above was reviewed. Potential methods of contraception were discussed. The patient elected to proceed with No data recorded.   Edinburgh Postnatal Depression Scale - 10/25/21 1153       Edinburgh Postnatal Depression Scale:  In the Past 7 Days   I have been able to laugh and see the funny side of things. 0    I have looked forward with enjoyment to things. 0    I have blamed myself unnecessarily when things went wrong. 2    I have been anxious or worried for no good reason. 1    I have felt scared or panicky for no good reason. 1    Things have been getting on top of me. 2    I have been so unhappy that I have had difficulty sleeping. 0    I have felt sad or miserable. 1    I have been so unhappy that I have been crying. 1    The thought of harming myself has occurred to me. 0    Edinburgh Postnatal Depression Scale Total 8             Health Maintenance Due  Topic Date Due   COVID-19 Vaccine (1) Never done   HPV VACCINES (1 - 2-dose series) Never done    The following portions of the patient's history were reviewed and updated as appropriate: allergies, current medications, past family history, past medical history, past social history, past surgical history, and problem list.  Review of Systems Pertinent items noted in HPI and remainder  of comprehensive ROS otherwise negative.  Objective:  BP 118/69   Pulse 93   Ht 5\' 8"  (1.727 m)   Wt 240 lb (108.9 kg)   Breastfeeding Yes   BMI 36.49 kg/m    VS reviewed, nursing note reviewed,  Constitutional: well developed, well nourished, no distress HEENT: normocephalic CV: normal rate Pulm/chest wall: normal effort Abdomen: soft Neuro: alert and oriented x 3 Skin: warm, dry. Incision well approximated, no edema, erythema, or exudate.  Mild tenderness to palpation approximately 3-4 cm above incision in lower abdomen. Psych: affect normal   Assessment:   1. Postpartum care following cesarean delivery --Doing well. Some emotional outbursts with lack of sleep, stress with 5 yo and new baby.  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   --She has a therapist and sees her 2 times per week and plans to continue.  2. Cesarean wound infection --Tx with abx, now well healed, no problems with incision   Plan:   Essential components of care per ACOG recommendations:  1.  Mood and well being: Patient with negative depression screening today. Reviewed local resources for support.  - Patient tobacco use? No.   - hx of drug use? No.    2. Infant care and feeding:  -Patient currently breastmilk  feeding? Yes. Reviewed importance of draining breast regularly to support lactation.  -Social determinants of health (SDOH) reviewed in EPIC. No concerns  3. Sexuality, contraception and birth spacing - Patient does not want a pregnancy in the next year.   - Reviewed reproductive life planning. Reviewed contraceptive methods based on pt preferences and effectiveness.  Patient desired Female Condom today.   - Discussed birth spacing of 18 months  4. Sleep and fatigue -Encouraged family/partner/community support of 4 hrs of uninterrupted sleep to help with mood and fatigue  5. Physical Recovery  - Discussed patients delivery and  complications. She describes her labor as mixed. - Patient had a C-section, no problems at delivery. Perineal healing reviewed. Patient expressed understanding. - Patient has urinary incontinence? No. - Patient is safe to resume physical and sexual activity  6.  Health Maintenance - HM due items addressed Yes - Last pap smear  Diagnosis  Date Value Ref Range Status  06/05/2019   Final   - Negative for intraepithelial lesion or malignancy (NILM)   Pap smear not done at today's visit.  -Breast Cancer screening indicated? No.   7. Chronic Disease/Pregnancy Condition follow up: None  - PCP follow up  Sharen Counter, CNM [facility] [employer], [facility]

## 2021-11-23 ENCOUNTER — Ambulatory Visit (HOSPITAL_BASED_OUTPATIENT_CLINIC_OR_DEPARTMENT_OTHER): Payer: Medicaid Other

## 2021-11-23 ENCOUNTER — Other Ambulatory Visit (HOSPITAL_COMMUNITY)
Admission: RE | Admit: 2021-11-23 | Discharge: 2021-11-23 | Disposition: A | Discharge status: Home / Self Care | Payer: Medicaid Other | Source: Ambulatory Visit | Attending: Obstetrics & Gynecology | Admitting: Obstetrics & Gynecology

## 2021-11-23 DIAGNOSIS — N898 Other specified noninflammatory disorders of vagina: Secondary | ICD-10-CM | POA: Diagnosis present

## 2021-11-23 NOTE — Progress Notes (Signed)
Patient came in today to do self swab. Patient was complaining of vaginal discharge and odor. tbw

## 2021-11-24 ENCOUNTER — Other Ambulatory Visit (HOSPITAL_BASED_OUTPATIENT_CLINIC_OR_DEPARTMENT_OTHER): Payer: Self-pay | Admitting: Obstetrics & Gynecology

## 2021-11-24 LAB — CERVICOVAGINAL ANCILLARY ONLY
Bacterial Vaginitis (gardnerella): NEGATIVE
Candida Glabrata: NEGATIVE
Candida Vaginitis: POSITIVE — AB
Comment: NEGATIVE
Comment: NEGATIVE
Comment: NEGATIVE

## 2021-11-24 MED ORDER — FLUCONAZOLE 150 MG PO TABS: ORAL_TABLET | ORAL | 0 refills | Status: DC

## 2021-12-06 ENCOUNTER — Encounter (HOSPITAL_BASED_OUTPATIENT_CLINIC_OR_DEPARTMENT_OTHER): Payer: Self-pay | Admitting: Advanced Practice Midwife

## 2021-12-06 ENCOUNTER — Ambulatory Visit (INDEPENDENT_AMBULATORY_CARE_PROVIDER_SITE_OTHER): Payer: Medicaid Other | Admitting: Advanced Practice Midwife

## 2021-12-06 VITALS — BP 118/82 | HR 71 | Wt 239.6 lb

## 2021-12-06 DIAGNOSIS — N764 Abscess of vulva: Secondary | ICD-10-CM | POA: Diagnosis not present

## 2021-12-06 DIAGNOSIS — L739 Follicular disorder, unspecified: Secondary | ICD-10-CM

## 2021-12-06 MED ORDER — SULFAMETHOXAZOLE-TRIMETHOPRIM 800-160 MG PO TABS: 1.0000 | ORAL_TABLET | Freq: Two times a day (BID) | ORAL | 0 refills | Status: AC

## 2021-12-06 NOTE — Progress Notes (Signed)
   GYNECOLOGY PROGRESS NOTE  History:  26 y.o. [id number] presents to [facility] office today for problem gyn visit. She reports boils on her mons area that have worsened recently. She has these intermittently, they resolve with warm compress/Epsom salt baths but in last few weeks she has had more boils that last longer.  She denies any hx of boils in other areas.  She denies h/a, dizziness, shortness of breath, n/v, or fever/chills.    The following portions of the patient's history were reviewed and updated as appropriate: allergies, current medications, past family history, past medical history, past social history, past surgical history and problem list. Last pap smear on 06/05/19 was normal.  Health Maintenance Due  Topic Date Due   COVID-19 Vaccine (1) Never done   HPV VACCINES (1 - 2-dose series) Never done   INFLUENZA VACCINE  11/29/2021     Review of Systems:  Pertinent items are noted in HPI.   Objective:  Physical Exam Blood pressure 118/82, pulse 71, weight 239 lb 9.6 oz (108.7 kg), currently breastfeeding. VS reviewed, nursing note reviewed,  Constitutional: well developed, well nourished, no distress HEENT: normocephalic CV: normal rate Pulm/chest wall: normal effort Breast Exam: deferred Abdomen: soft Neuro: alert and oriented x 3 Skin: warm, dry Psych: affect normal Pelvic exam: Deferred. Visual inspection of mons and labia.  No lesions on labia or perineum.  3-4 raised round nodules on mons, in areas with hair. Some evidence of recent clear drainage from 2 of these.  Assessment & Plan:  1. Folliculitis --Discussed prevention and printed materials on boils/hydradenitis sent to patient including topical treatments like Emuaid.  Without evidence of boils in other areas like axilla, pt not classic for hydradenitis but frequent boils may be evidence of this. Pt to follow up with [place] as needed.  - sulfamethoxazole-trimethoprim (BACTRIM DS) 800-160 MG tablet; Take 1  tablet by mouth 2 (two) times daily for 7 days.  Dispense: 14 tablet; Refill: 0  2. Abscess of genital labia  - sulfamethoxazole-trimethoprim (BACTRIM DS) 800-160 MG tablet; Take 1 tablet by mouth 2 (two) times daily for 7 days.  Dispense: 14 tablet; Refill: 0   Return if symptoms worsen or fail to improve.   Sharen Counter, CNM 5:58 PM

## 2022-04-28 ENCOUNTER — Encounter (HOSPITAL_BASED_OUTPATIENT_CLINIC_OR_DEPARTMENT_OTHER): Payer: Self-pay | Admitting: Obstetrics & Gynecology

## 2022-04-28 ENCOUNTER — Ambulatory Visit (INDEPENDENT_AMBULATORY_CARE_PROVIDER_SITE_OTHER): Payer: Medicaid Other | Admitting: Obstetrics & Gynecology

## 2022-04-28 VITALS — BP 131/87 | HR 72 | Ht 68.0 in | Wt 260.8 lb

## 2022-04-28 DIAGNOSIS — N644 Mastodynia: Secondary | ICD-10-CM | POA: Diagnosis not present

## 2022-04-28 NOTE — Patient Instructions (Addendum)
Vit E 200u for breast pain

## 2022-04-28 NOTE — Progress Notes (Unsigned)
GYNECOLOGY  VISIT  CC:   breast pain  HPI: 26 y.o. [id number] Significant Other Black or African American female here for complaints of breast pain.     No past medical history on file.  MEDS:   No current outpatient medications on file prior to visit.   No current facility-administered medications on file prior to visit.    ALLERGIES: Penicillins  SH:  ***  ROS  PHYSICAL EXAMINATION:    There were no vitals taken for this visit.    General appearance: alert, cooperative and appears stated age Neck: no adenopathy, supple, symmetrical, trachea midline and thyroid {CHL AMB PHY EX THYROID NORM DEFAULT:[phone]::"normal to inspection and palpation"} CV:  {Exam; heart brief:31539} Lungs:  {pe lungs ob:314451} Breasts: {Exam; breast:13139::"normal appearance, no masses or tenderness"} Abdomen: soft, non-tender; bowel sounds normal; no masses,  no organomegaly Lymph:  no inguinal LAD noted  Pelvic: External genitalia:  no lesions              Urethra:  normal appearing urethra with no masses, tenderness or lesions              Bartholins and Skenes: normal                 Vagina: normal appearing vagina with normal color and discharge, no lesions              Cervix: {CHL AMB PHY EX CERVIX NORM DEFAULT:[id number]::"no lesions"}              Bimanual Exam:  Uterus:  {CHL AMB PHY EX UTERUS NORM DEFAULT:[phone]::"normal size, contour, position, consistency, mobility, non-tender"}              Adnexa: {CHL AMB PHY EX ADNEXA NO MASS DEFAULT:[phone]::"no mass, fullness, tenderness"}              Rectovaginal: {yes no:314532}.  Confirms.              Anus:  normal sphincter tone, no lesions  Chaperone, ***, CMA, was present for exam.  Assessment/Plan: There are no diagnoses linked to this encounter.

## 2022-04-30 ENCOUNTER — Encounter (HOSPITAL_BASED_OUTPATIENT_CLINIC_OR_DEPARTMENT_OTHER): Payer: Self-pay | Admitting: Obstetrics & Gynecology

## 2022-11-07 ENCOUNTER — Telehealth (HOSPITAL_BASED_OUTPATIENT_CLINIC_OR_DEPARTMENT_OTHER): Payer: Self-pay | Admitting: *Deleted

## 2022-11-07 NOTE — Telephone Encounter (Signed)
Called pt in response to appt request. Pt states that she no longer needs the appt. She states that the area of concern "popped" last night and she is no longer having pain. Pt will call if symptoms return.

## 2022-12-07 ENCOUNTER — Telehealth (HOSPITAL_BASED_OUTPATIENT_CLINIC_OR_DEPARTMENT_OTHER): Payer: Self-pay | Admitting: *Deleted

## 2022-12-07 NOTE — Telephone Encounter (Signed)
Pt called with complaints of having pain in her breasts, back and neck due to weight. She is requesting to talk with a provider about these issues. Pt also asks about scheduling appt for PAP. Appt provided.

## 2022-12-29 ENCOUNTER — Encounter (HOSPITAL_BASED_OUTPATIENT_CLINIC_OR_DEPARTMENT_OTHER): Payer: Self-pay | Admitting: Medical

## 2022-12-29 ENCOUNTER — Ambulatory Visit (INDEPENDENT_AMBULATORY_CARE_PROVIDER_SITE_OTHER): Payer: Medicaid Other | Admitting: Medical

## 2022-12-29 ENCOUNTER — Other Ambulatory Visit (HOSPITAL_COMMUNITY)
Admission: RE | Admit: 2022-12-29 | Discharge: 2022-12-29 | Disposition: A | Discharge status: Home / Self Care | Payer: Medicaid Other | Source: Ambulatory Visit | Attending: Medical | Admitting: Medical

## 2022-12-29 VITALS — BP 131/84 | HR 70 | Ht 68.0 in | Wt 264.8 lb

## 2022-12-29 DIAGNOSIS — Z01419 Encounter for gynecological examination (general) (routine) without abnormal findings: Secondary | ICD-10-CM | POA: Diagnosis not present

## 2022-12-29 DIAGNOSIS — N62 Hypertrophy of breast: Secondary | ICD-10-CM | POA: Diagnosis not present

## 2022-12-29 DIAGNOSIS — M542 Cervicalgia: Secondary | ICD-10-CM | POA: Diagnosis not present

## 2022-12-29 DIAGNOSIS — Z124 Encounter for screening for malignant neoplasm of cervix: Secondary | ICD-10-CM | POA: Insufficient documentation

## 2022-12-29 DIAGNOSIS — M546 Pain in thoracic spine: Secondary | ICD-10-CM

## 2022-12-29 NOTE — Progress Notes (Signed)
History:  [name] is a 27 y.o. [id number] who presents to clinic today for annual exam. Last pap smear was 06/05/2019 and normal. Patient is currently sexually active with one female partner and using NFP for birth control. Periods are regular lasting ~ 6 days. She denies vaginal bleeding, discharge, UTI symptoms, GI issues or breast concerns today. She is concerned about breast pain, neck pain and back pain worsening over the last few months. She is no longer breastfeeding.   The following portions of the patient's history were reviewed and updated as appropriate: allergies, current medications, family history, past medical history, social history, past surgical history and problem list.  Review of Systems:  Review of Systems  Constitutional:  Negative for fever and malaise/fatigue.  Gastrointestinal:  Negative for abdominal pain, constipation, diarrhea, nausea and vomiting.  Genitourinary:  Negative for dysuria, frequency and urgency.       Neg - vaginal bleeding, discharge, pelvic pain + breast pain  Musculoskeletal:  Positive for back pain and neck pain.      Objective:  Physical Exam BP 131/84 (BP Location: Right Arm, Patient Position: Sitting, Cuff Size: Large)   Pulse 70   Ht 5\' 8"  (1.727 m) Comment: Reported  Wt 264 lb 12.8 oz (120.1 kg)   LMP 12/17/2022   BMI 40.26 kg/m  Physical Exam Vitals and nursing note reviewed. Exam conducted with a chaperone present.  Constitutional:      General: She is not in acute distress.    Appearance: Normal appearance. She is well-developed. She is obese.  HENT:     Head: Normocephalic and atraumatic.  Neck:     Thyroid: No thyromegaly.  Cardiovascular:     Rate and Rhythm: Normal rate and regular rhythm.     Heart sounds: No murmur heard. Pulmonary:     Effort: Pulmonary effort is normal. No respiratory distress.     Breath sounds: Normal breath sounds. No wheezing.  Abdominal:     General: Abdomen is flat. Bowel  sounds are normal. There is no distension.     Palpations: Abdomen is soft. There is no mass.     Tenderness: There is no abdominal tenderness. There is no guarding or rebound.  Genitourinary:    General: Normal vulva.     Vagina: No vaginal discharge, erythema, tenderness or bleeding.     Cervix: Cervical bleeding (scant following pap) present. No cervical motion tenderness, discharge, friability, lesion or erythema.     Uterus: Not enlarged and not tender.      Adnexa:        Right: No mass or tenderness.         Left: No mass or tenderness.    Musculoskeletal:     Cervical back: Neck supple.  Skin:    General: Skin is warm and dry.     Findings: No erythema.  Neurological:     Mental Status: She is alert and oriented to person, place, and time.  Psychiatric:        Mood and Affect: Mood normal.      Health Maintenance Due  Topic Date Due   COVID-19 Vaccine (1 - 2023-24 season) Never done   PAP-Cervical Cytology Screening  06/04/2022   PAP SMEAR-Modifier  06/04/2022   INFLUENZA VACCINE  11/30/2022    Labs, imaging and previous visits in Epic and Care Everywhere reviewed  Assessment & Plan:  1. Well woman exam with routine gynecological exam - Pap smear today. Results will be shared  via MyChart   2. Large breasts  - Ambulatory referral to Plastic Surgery  3. Acute bilateral thoracic back pain - Ambulatory referral to Plastic Surgery  4. Neck pain - Ambulatory referral to Plastic Surgery   Return in about 1 year (around 12/29/2023) for Annual exam.  Marny Lowenstein, PA-C 12/29/2022 10:53 AM

## 2023-01-04 LAB — CYTOLOGY - PAP: Diagnosis: NEGATIVE

## 2023-01-14 IMAGING — US US OB TRANSVAGINAL
1 series · 15 of 28 positions shown · non-contrast
Comparison: 07/23/2020

CLINICAL DATA: Abdominal pain in early pregnancy

EXAM:
TRANSVAGINAL OB ULTRASOUND
TECHNIQUE: Transvaginal ultrasound was performed for complete evaluation of the
gestation as well as the maternal uterus, adnexal regions, and
pelvic cul-de-sac.

[Series 1: us ob transvaginal · 15 of 39 slices shown]
[im 1/39]
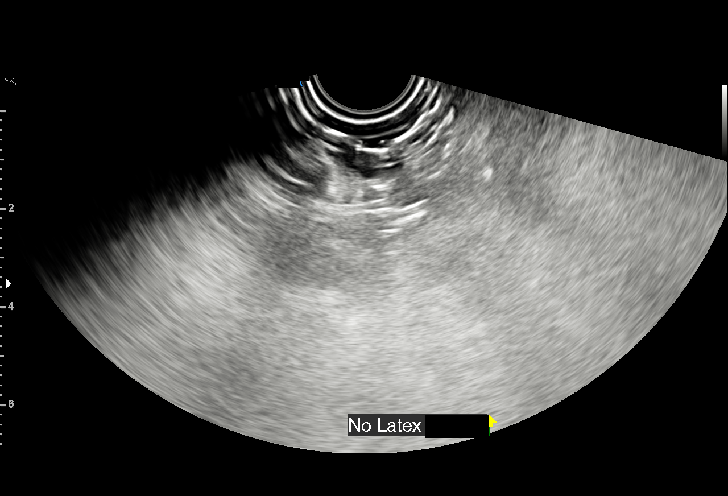
[im 3/39]
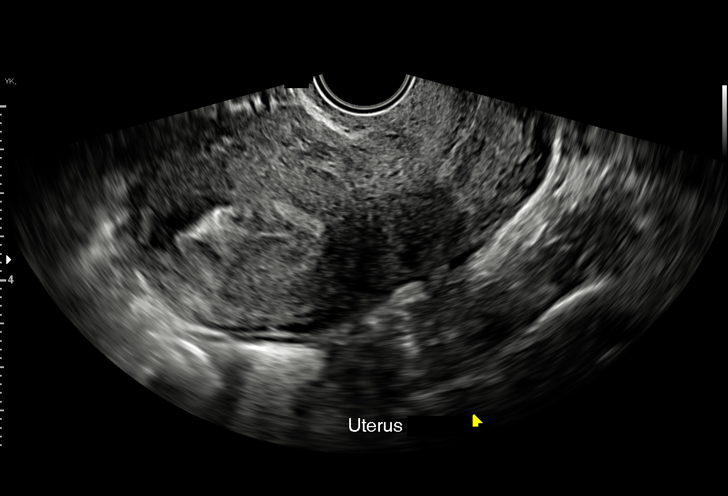
[im 6/39]
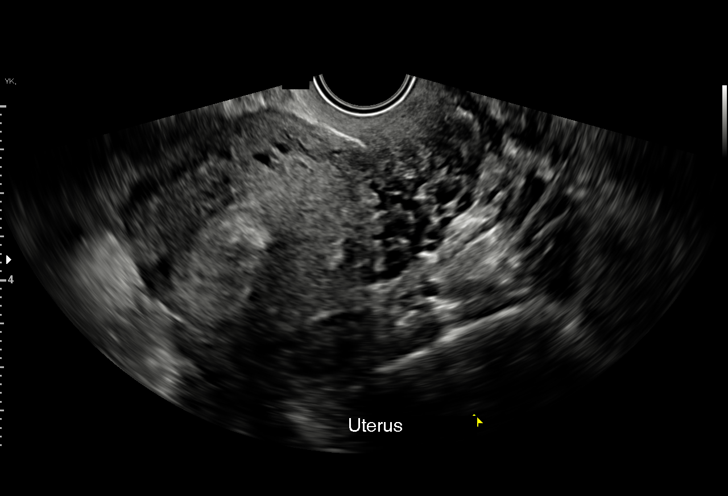
[im 9/39]
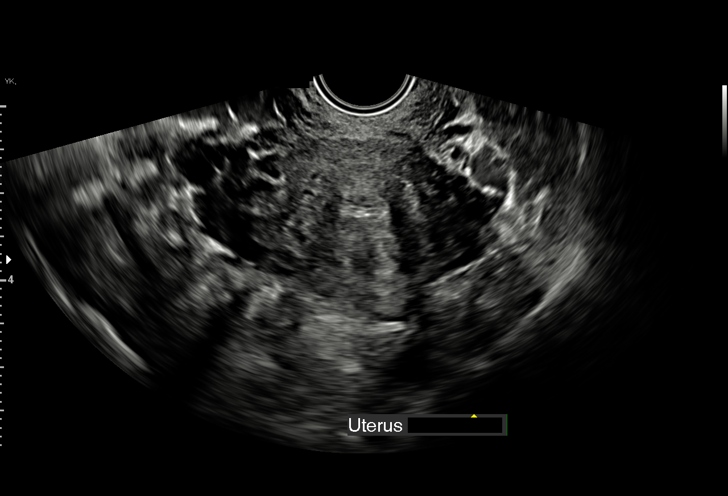
[im 12/39]
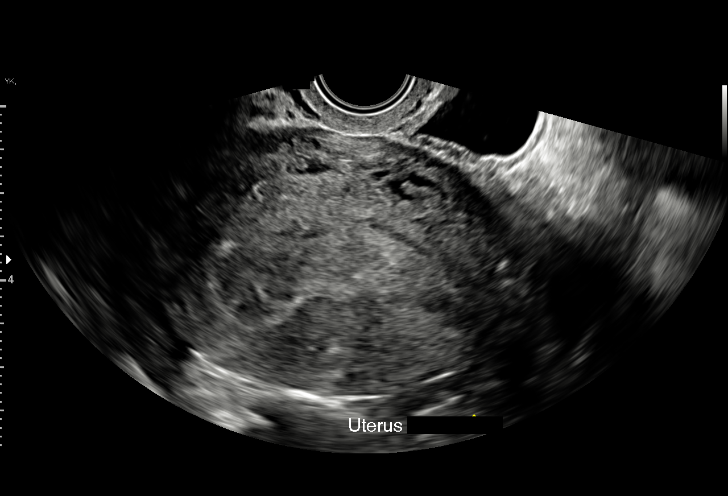
[im 15/39]
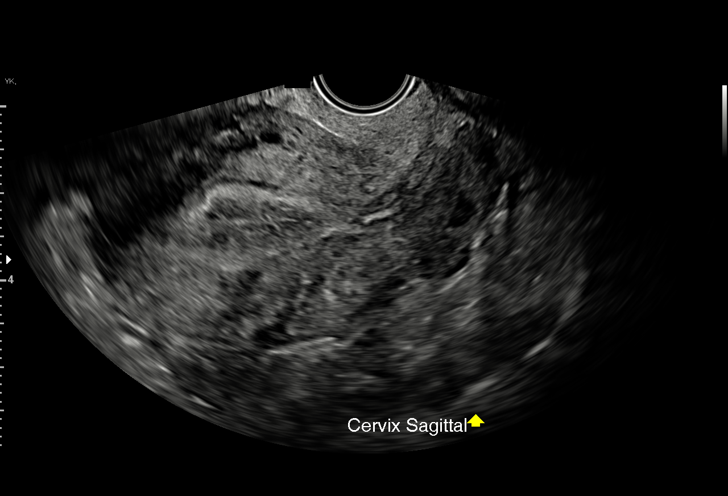
[im 17/39]
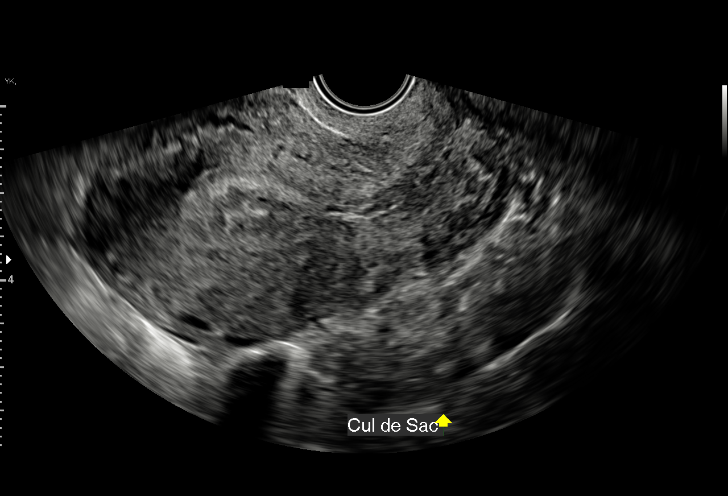
[im 20/39]
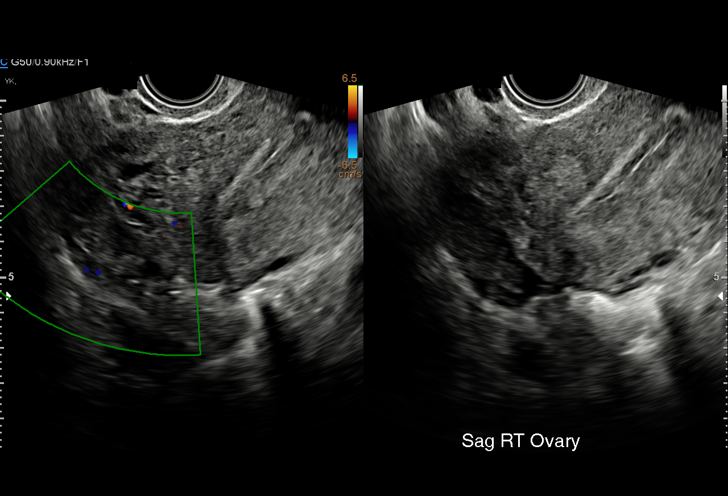
[im 22/39]
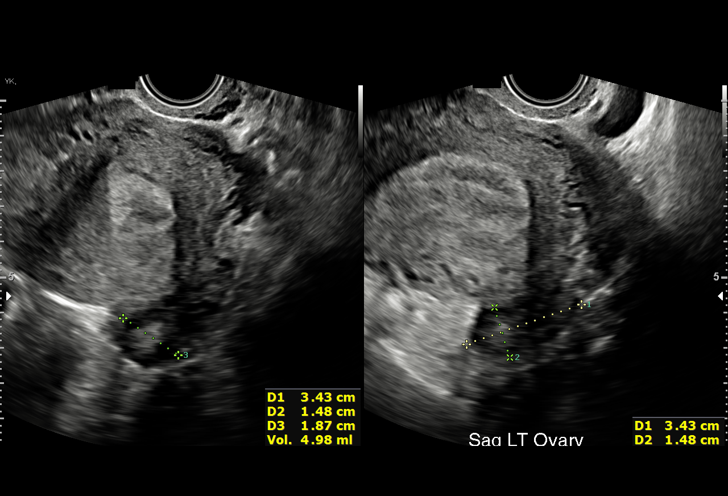
[im 24/39]
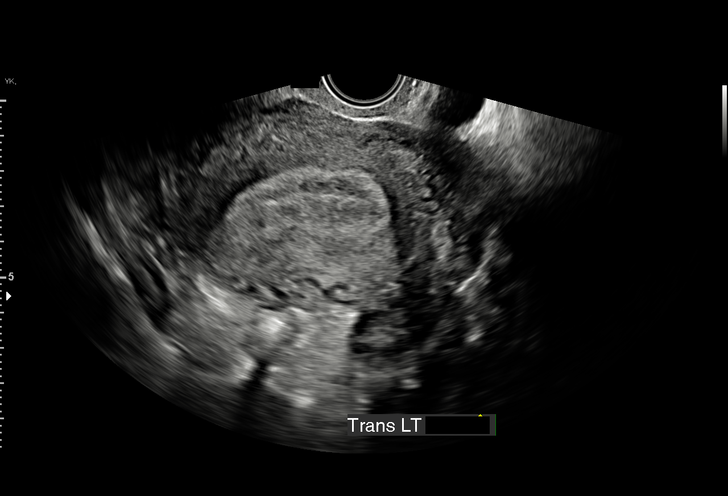
[im 27/39]
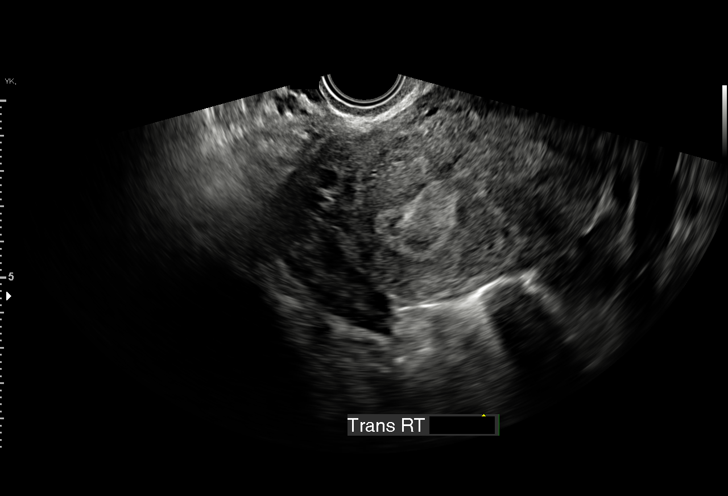
[im 30/39]
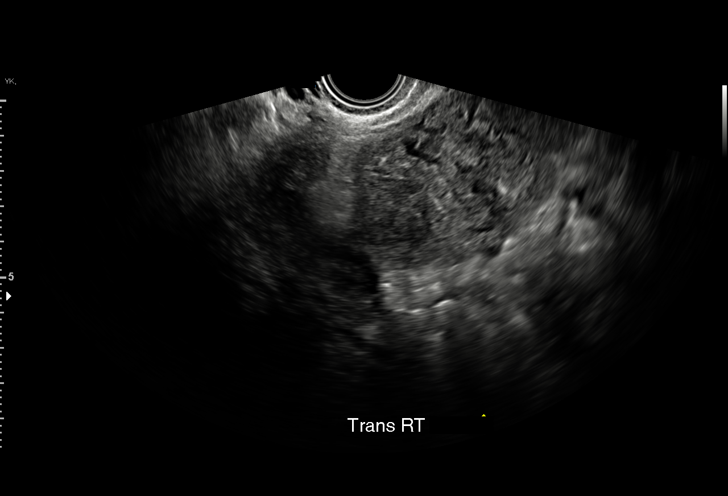
[im 33/39]
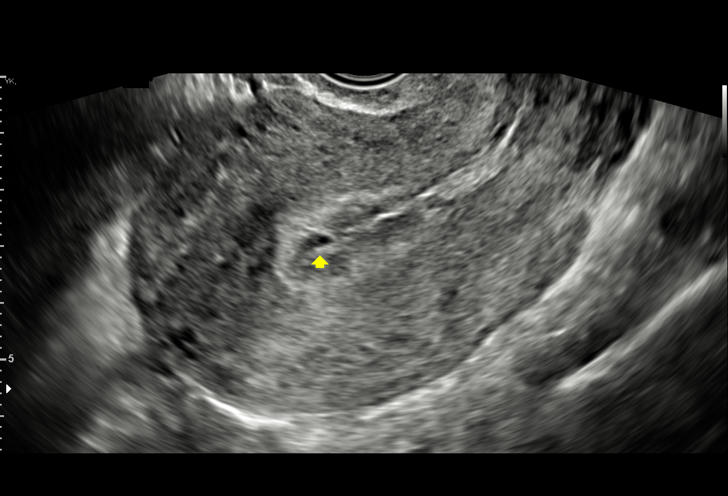
[im 36/39]
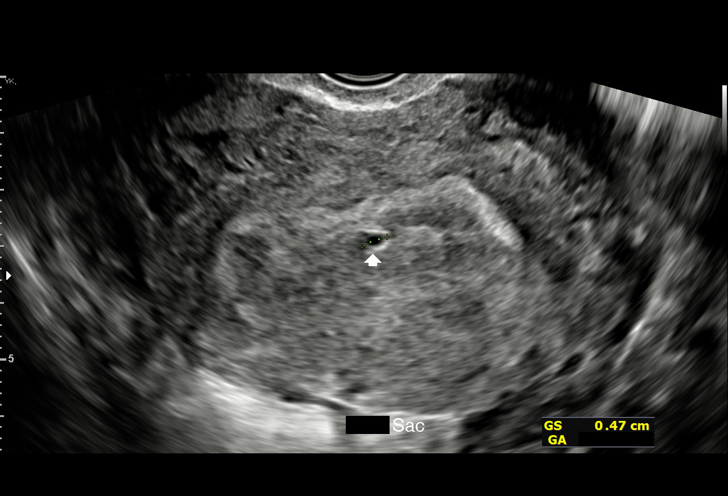
[im 39/39]
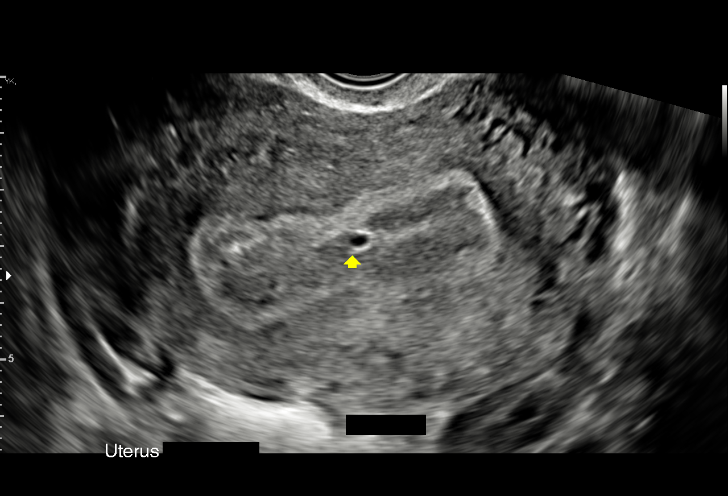

[15 of 28 positions shown; findings below may reference images not displayed]

FINDINGS: Intrauterine gestational sac: Single

Yolk sac:  Not Visualized.

Embryo:  Not Visualized.

Cardiac Activity: Not Visualized.

MSD: 3.7 mm   5 w   0 d

Subchorionic hemorrhage:  None visualized.

Maternal uterus/adnexae:

Right ovary: Appears normal

Left ovary: Appears norm

Other :None

Free fluid:  None
IMPRESSION: Probable early intrauterine gestational sac, but no yolk sac, fetal
pole, or cardiac activity yet visualized. Recommend follow-up
quantitative B-HCG levels and follow-up US in 14 days to assess
viability. This recommendation follows SRU consensus guidelines:
Diagnostic Criteria for Nonviable Pregnancy Early in the First
Trimester. N Engl J Med 0696; [DATE].

## 2023-01-30 ENCOUNTER — Ambulatory Visit (HOSPITAL_BASED_OUTPATIENT_CLINIC_OR_DEPARTMENT_OTHER): Payer: Medicaid Other | Admitting: Advanced Practice Midwife

## 2023-01-30 ENCOUNTER — Encounter (HOSPITAL_BASED_OUTPATIENT_CLINIC_OR_DEPARTMENT_OTHER): Payer: Self-pay | Admitting: Advanced Practice Midwife

## 2023-01-30 VITALS — BP 118/77 | HR 78 | Ht 68.0 in | Wt 263.4 lb

## 2023-01-30 DIAGNOSIS — L732 Hidradenitis suppurativa: Secondary | ICD-10-CM

## 2023-01-30 NOTE — Progress Notes (Signed)
    GYNECOLOGY PROGRESS NOTE  History:  27 y.o. [id number] presents to [facility] office today for problem gyn visit. She has Hx Primary LTCS 2023. States she has hidradenitis and gets boils on occasion. She developed one that was very close to her prior CS incision. The boil opened and drained but now there is a scar. She is wondering if the scar appears normal.   The following portions of the patient's history were reviewed and updated as appropriate: allergies, current medications, past family history, past med/ical history, past social history, past surgical history and problem list. Last pap smear on 12/29/2022 was normal. Next pap due 11/2025.  Health Maintenance Due  Topic Date Due   INFLUENZA VACCINE  11/30/2022   COVID-19 Vaccine (1 - 2023-24 season) Never done     Review of Systems:  Pertinent items are noted in HPI.   Objective:  Physical Exam Blood pressure 118/77, pulse 78, height 5\' 8"  (1.727 m), weight 263 lb 6.4 oz (119.5 kg), last menstrual period 01/15/2023, currently breastfeeding. VS reviewed, nursing note reviewed,  Constitutional: well developed, well nourished, no distress HEENT: normocephalic CV: normal rate Pulm/chest wall: normal effort Breast Exam: deferred Abdomen: soft. Lower abdominal incision appears normal. Small furuncle has healed. No erythema or drainage noted. Pt reassured. Neuro: alert and oriented x 3 Skin: warm, dry Psych: affect normal  Assessment & Plan:  1. Hidradenitis -Pt developed small furuncle near CS incision. Lesion has drained and has healed. Normal appearing scar at this time.  Follow-up August 2025 annual gyn exam.   Letta Kocher, CNM 10:46 AM

## 2023-02-08 ENCOUNTER — Encounter (HOSPITAL_BASED_OUTPATIENT_CLINIC_OR_DEPARTMENT_OTHER): Payer: Self-pay

## 2023-02-08 ENCOUNTER — Ambulatory Visit (HOSPITAL_BASED_OUTPATIENT_CLINIC_OR_DEPARTMENT_OTHER): Payer: Medicaid Other | Admitting: Obstetrics & Gynecology

## 2023-02-08 ENCOUNTER — Telehealth (HOSPITAL_BASED_OUTPATIENT_CLINIC_OR_DEPARTMENT_OTHER): Payer: Self-pay | Admitting: *Deleted

## 2023-02-08 NOTE — Telephone Encounter (Signed)
Called patient Marisa Page stated Marisa Page was not able to come that Marisa Page didn't have a ride. I offered patient a appointment with Lupita Leash per pt she do not want to see her. Marisa Page only wants to see Dr. Hyacinth Meeker. I offered patient appointment with Dr Hyacinth Meeker next available patient stated Marisa Page will be moving out stated .

## 2023-03-27 ENCOUNTER — Encounter: Payer: Self-pay | Admitting: Plastic Surgery

## 2023-03-27 ENCOUNTER — Ambulatory Visit: Payer: Medicaid Other | Admitting: Plastic Surgery

## 2023-03-27 VITALS — BP 115/76 | HR 63 | Ht 68.0 in | Wt 255.0 lb

## 2023-03-27 DIAGNOSIS — N62 Hypertrophy of breast: Secondary | ICD-10-CM | POA: Diagnosis not present

## 2023-03-27 DIAGNOSIS — M542 Cervicalgia: Secondary | ICD-10-CM | POA: Diagnosis not present

## 2023-03-27 DIAGNOSIS — M546 Pain in thoracic spine: Secondary | ICD-10-CM

## 2023-03-27 DIAGNOSIS — M549 Dorsalgia, unspecified: Secondary | ICD-10-CM | POA: Insufficient documentation

## 2023-03-27 DIAGNOSIS — G8929 Other chronic pain: Secondary | ICD-10-CM | POA: Insufficient documentation

## 2023-03-27 NOTE — Progress Notes (Signed)
Patient ID: Marisa Page, female    DOB: March 04, 1996, 27 y.o.   MRN: [id number]   Chief Complaint  Patient presents with   Breast Problem    Mammary Hyperplasia: The patient is a 27 y.o. female with a history of mammary hyperplasia for several years.  She has extremely large breasts causing symptoms that include the following: Back pain in the upper and lower back, including neck pain. She pulls or pins her bra straps to provide better lift and relief of the pressure and pain. She notices relief by holding her breast up manually.  Her shoulder straps cause grooves and pain and pressure that requires padding for relief. Pain medication is sometimes required with motrin and tylenol.  Activities that are hindered by enlarged breasts include: exercise and running.  She has tried supportive clothing as well as fitted bras without improvement.  Her breasts are extremely large and fairly symmetric.  She has hyperpigmentation of the inframammary area on both sides.  The sternal to nipple distance on the right is 37 cm and the left is 37 cm.  The IMF distance is 20 cm.  She is 5 feet 8 inches tall and weighs 260 pounds.  The BMI = 39.5 kg/m.  Preoperative bra size = J cup.  The estimated excess breast tissue to be removed at the time of surgery = 980 grams on the left and 980 grams on the right.  Mammogram history: not required.  Family history of breast cancer:  none.  Tobacco use: Yes.   The patient expresses the desire to pursue surgical intervention.  The patient had a C-section and her wisdom teeth removed with no problems.     Review of Systems  Constitutional: Negative.   HENT: Negative.    Eyes: Negative.   Respiratory: Negative.  Negative for chest tightness and shortness of breath.   Cardiovascular: Negative.   Gastrointestinal: Negative.   Endocrine: Negative.   Genitourinary: Negative.   Musculoskeletal:  Positive for back pain and neck pain.  Skin:  Positive for rash.     History reviewed. No pertinent past medical history.  Past Surgical History:  Procedure Laterality Date   CESAREAN SECTION N/A 09/12/2021   Procedure: CESAREAN SECTION;  Surgeon: Venora Maples, MD;  Location: [facility];  Service: Obstetrics;  Laterality: N/A;   WISDOM TOOTH EXTRACTION       No current outpatient medications on file.   Objective:   Vitals:   03/27/23 1321  BP: 115/76  Pulse: 63  SpO2: 97%    Physical Exam Vitals reviewed.  Constitutional:      Appearance: Normal appearance.  HENT:     Head: Normocephalic and atraumatic.  Cardiovascular:     Rate and Rhythm: Normal rate.     Pulses: Normal pulses.  Pulmonary:     Effort: Pulmonary effort is normal.  Abdominal:     Palpations: Abdomen is soft.  Musculoskeletal:        General: No swelling or deformity.  Skin:    General: Skin is warm.     Capillary Refill: Capillary refill takes less than 2 seconds.     Coloration: Skin is not jaundiced.     Findings: No bruising or lesion.  Neurological:     Mental Status: She is alert and oriented to person, place, and time.  Psychiatric:        Mood and Affect: Mood normal.        Behavior: Behavior normal.  Thought Content: Thought content normal.        Judgment: Judgment normal.     Assessment & Plan:  Chronic bilateral thoracic back pain  Symptomatic mammary hypertrophy  Neck pain  The procedure the patient selected and that was best for the patient was discussed. The risk were discussed and include but not limited to the following:  Breast asymmetry, fluid accumulation, firmness of the breast, inability to breast feed, loss of nipple or areola, skin loss, change in skin and nipple sensation, fat necrosis of the breast tissue, bleeding, infection and healing delay.  There are risks of anesthesia and injury to nerves or blood vessels.  Allergic reaction to tape, suture and skin glue are possible.  There will be swelling.  Any of these can  lead to the need for revisional surgery which is not included in this surgery.  A breast reduction has potential to interfere with diagnostic procedures in the future.  This procedure is best done when the breast is fully developed.  Changes in the breast will continue to occur over time: pregnancy, weight gain or weigh loss. No guarantees are given for a certain bra or breast size.    Total time: 40 minutes. This includes time spent with the patient during the visit as well as time spent before and after the visit reviewing the chart, documenting the encounter, ordering pertinent studies and literature for the patient.   Physical therapy: Not required Mammogram: Not indicated The patient is a candidate for bilateral breast reduction.  She will need to be 3 months smoke-free tobacco free in order to have the surgery.  We will do a recheck in January or February.  If she is tobacco free we will move ahead with surgery for preauthorization.  Pictures were obtained of the patient and placed in the chart with the patient's or guardian's permission.   Alena Bills Aggie Douse, DO

## 2023-06-26 ENCOUNTER — Encounter: Payer: Self-pay | Admitting: Plastic Surgery

## 2023-06-26 ENCOUNTER — Ambulatory Visit (INDEPENDENT_AMBULATORY_CARE_PROVIDER_SITE_OTHER): Payer: Medicaid Other | Admitting: Plastic Surgery

## 2023-06-26 VITALS — BP 128/81 | HR 89 | Ht 68.0 in | Wt 254.8 lb

## 2023-06-26 DIAGNOSIS — M546 Pain in thoracic spine: Secondary | ICD-10-CM | POA: Diagnosis not present

## 2023-06-26 DIAGNOSIS — M542 Cervicalgia: Secondary | ICD-10-CM

## 2023-06-26 DIAGNOSIS — M545 Low back pain, unspecified: Secondary | ICD-10-CM | POA: Diagnosis not present

## 2023-06-26 DIAGNOSIS — G8929 Other chronic pain: Secondary | ICD-10-CM

## 2023-06-26 DIAGNOSIS — N62 Hypertrophy of breast: Secondary | ICD-10-CM | POA: Diagnosis not present

## 2023-06-26 NOTE — Progress Notes (Signed)
   Subjective:    Patient ID: Marisa Page, female    DOB: 09/07/1995, 28 y.o.   MRN: [id number]  The patient is a 28 year old female here for follow-up on her breast.  She still complains of back pain in the upper and lower back area including the neck.  She has tried different mechanisms to try and relieve the pain of her shoulders.  She is got grooving of her shoulders due to the bra straps.  She has skin breakdown due to the chafing of her breast that is minimally controlled with lotions and creams.  They are fairly symmetric but she does have hyperpigmentation at her inframammary folds.  She is 5 feet 8 inches tall and weighs 260 pounds her BMI is 39.5 kg/m.  The amount of tissue estimated for removal is 950 g bilaterally.  The patient is still using tobacco.      Review of Systems  Constitutional:  Positive for activity change. Negative for appetite change.  Eyes: Negative.   Respiratory: Negative.  Negative for chest tightness.   Cardiovascular: Negative.   Gastrointestinal: Negative.   Endocrine: Negative.   Genitourinary: Negative.   Musculoskeletal:  Positive for back pain and neck pain.  Skin:  Positive for rash.       Objective:   Physical Exam Constitutional:      Appearance: Normal appearance.  HENT:     Head: Atraumatic.  Cardiovascular:     Rate and Rhythm: Normal rate.     Pulses: Normal pulses.  Pulmonary:     Effort: Pulmonary effort is normal.  Abdominal:     Palpations: Abdomen is soft.  Musculoskeletal:        General: No swelling or deformity.  Skin:    General: Skin is warm.     Capillary Refill: Capillary refill takes less than 2 seconds.  Neurological:     Mental Status: She is alert and oriented to person, place, and time.  Psychiatric:        Mood and Affect: Mood normal.        Behavior: Behavior normal.        Thought Content: Thought content normal.        Judgment: Judgment normal.        Assessment & Plan:      ICD-10-CM   1. Neck pain  M54.2     2. Symptomatic mammary hypertrophy  N62     3. Chronic bilateral thoracic back pain  M54.6    G89.29       The patient will plan to give [place] a call when she is tobacco free and we will get her in.  She has to be 3 months tobacco free prior to a breast reduction.

## 2023-07-18 IMAGING — US US OB < 14 WEEKS - US OB TV
1 series · 15 of 28 positions shown · non-contrast
Comparison: None.

CLINICAL DATA: Vaginal bleeding

EXAM:
OBSTETRIC <14 WK US AND TRANSVAGINAL OB US
TECHNIQUE: Both transabdominal and transvaginal ultrasound examinations were
performed for complete evaluation of the gestation as well as the
maternal uterus, adnexal regions, and pelvic cul-de-sac.
Transvaginal technique was performed to assess early pregnancy.

[Series 1: us ob < 14 weeks - us ob tv · 64 acquisitions, 15 frames shown]
[im 1/64]
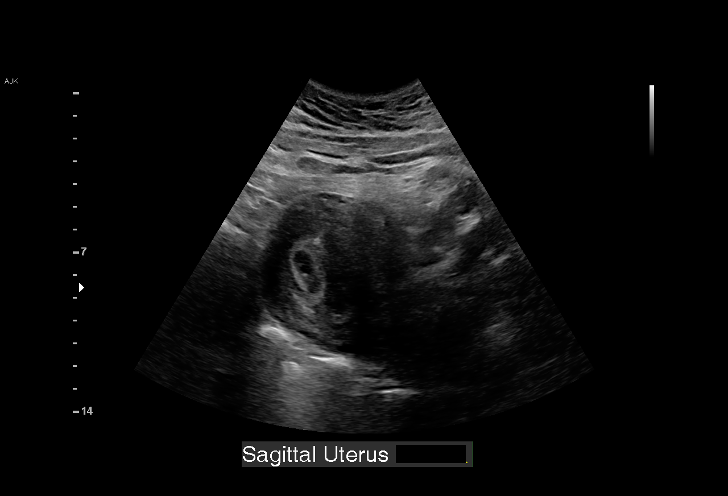
[im 5/64]
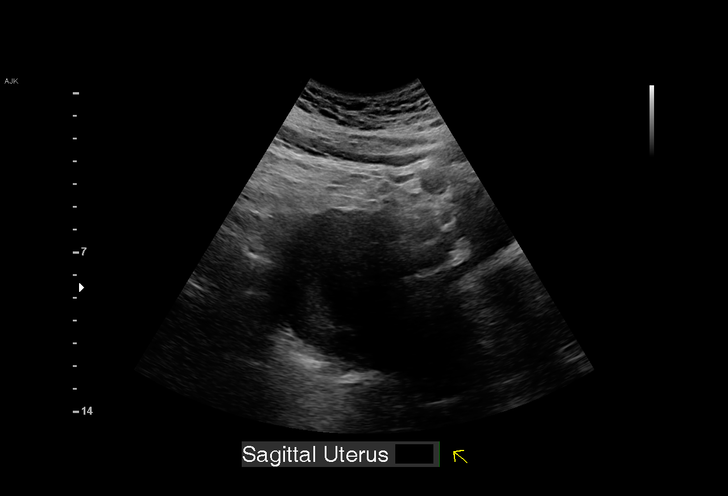
[im 10/64]
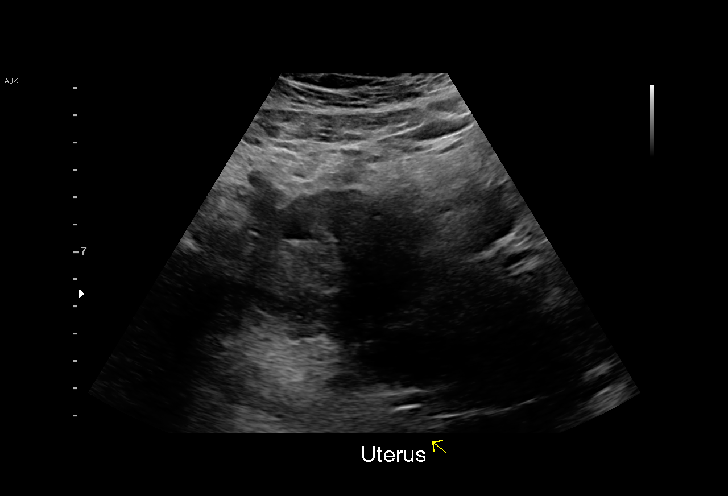
[im 15/64]
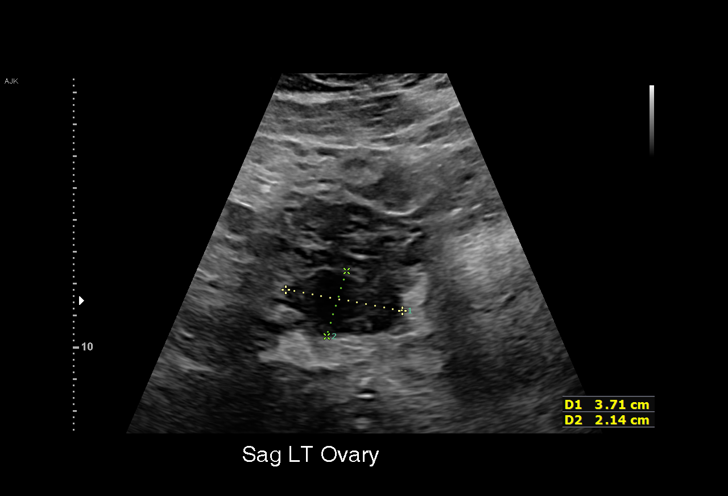
[im 19/64]
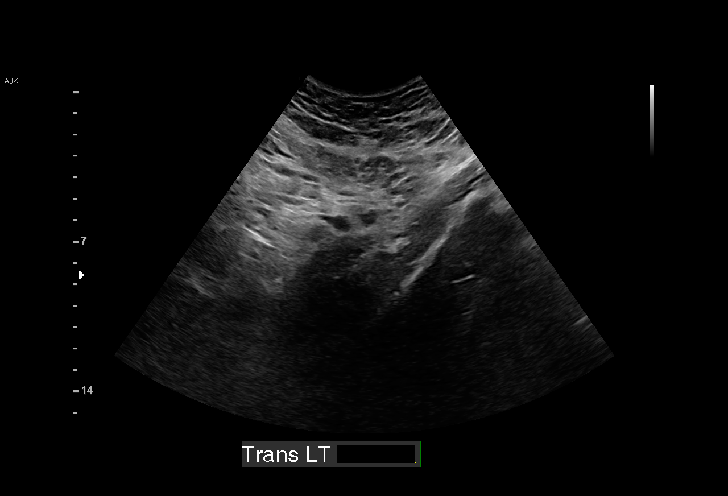
[im 24/64]
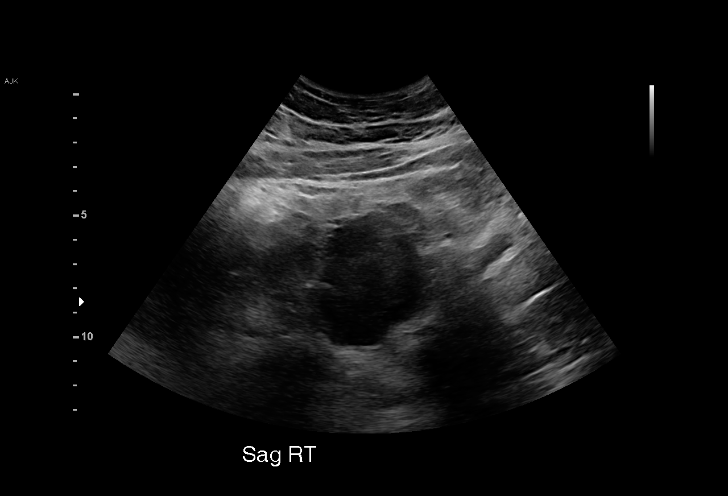
[im 29/64]
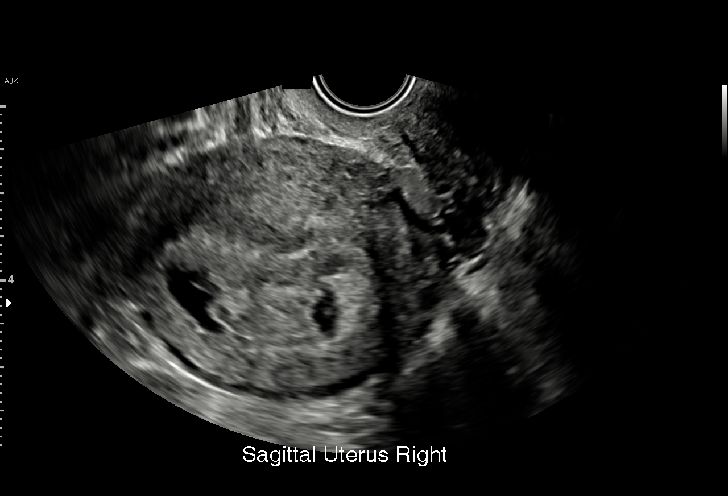
[im 33/64]
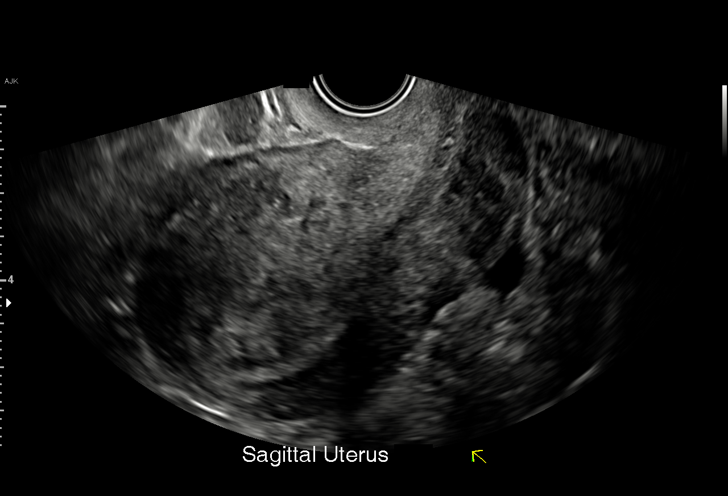
[im 36/64]
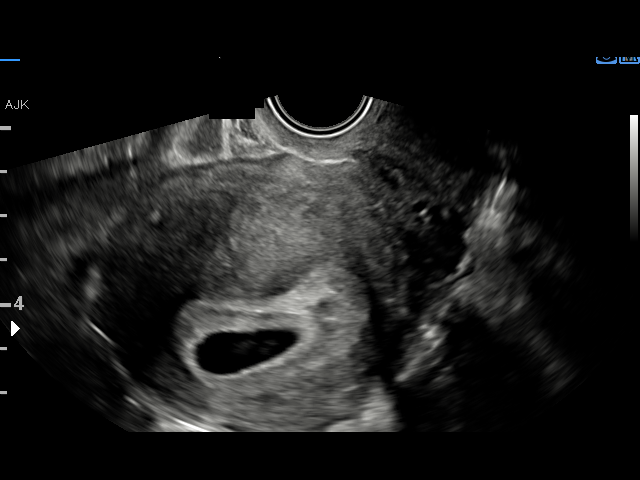
[im 40/64]
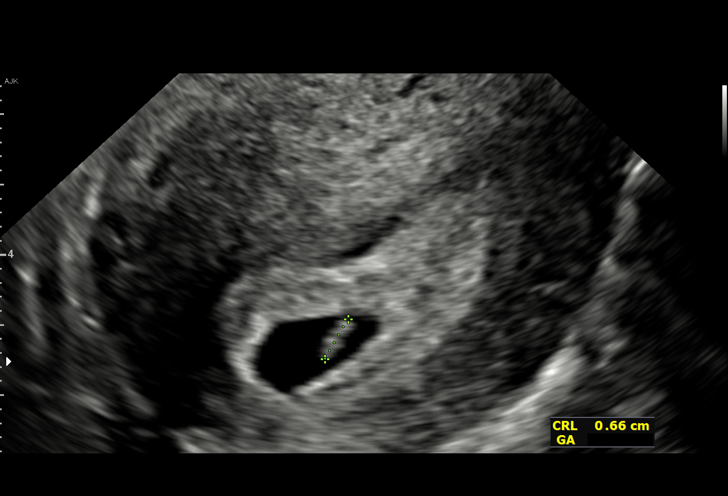
[im 45/64]
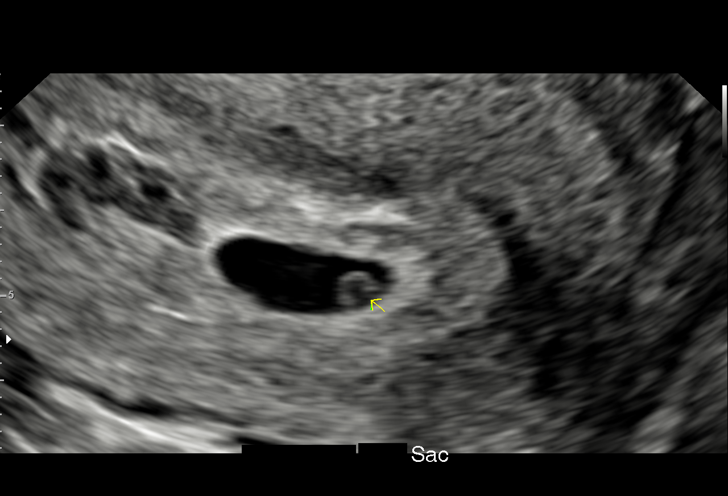
[im 50/64]
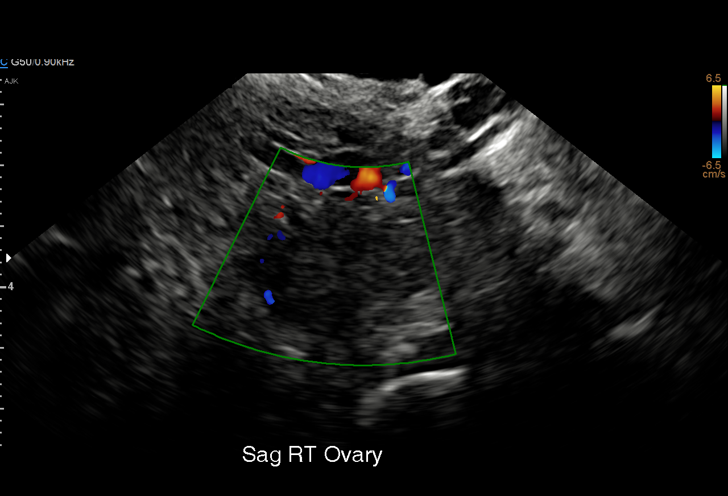
[im 54/64]
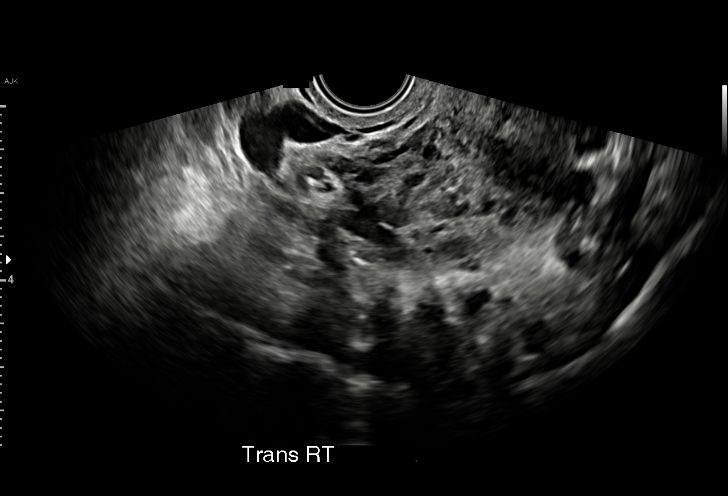
[im 59/64]
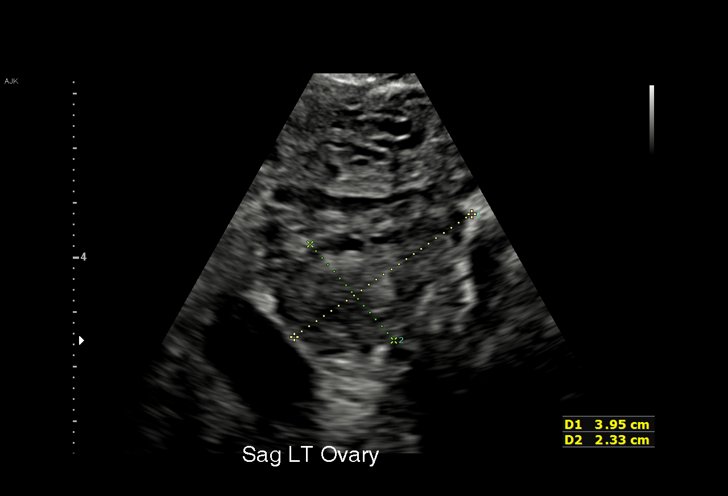
[im 64/64]
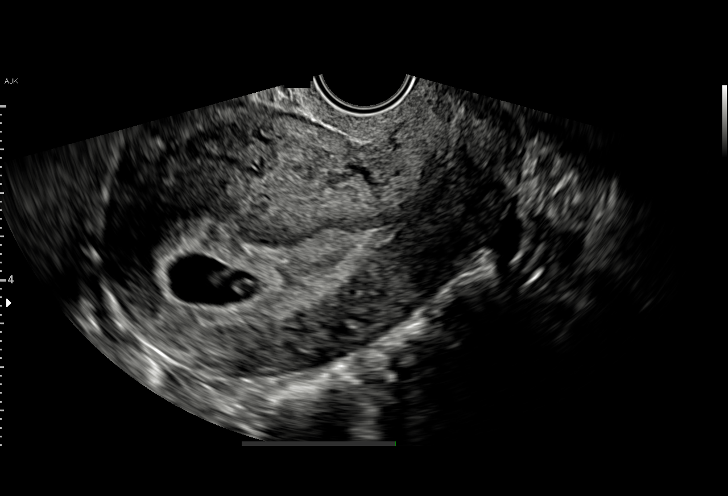

[15 of 28 positions shown; findings below may reference images not displayed]

FINDINGS: Intrauterine gestational sac: Single

Yolk sac:  Visualized.

Embryo:  Visualized.

Cardiac Activity: Visualized.

Heart Rate: 140 bpm

CRL:  6.5 mm   6 w   3 d                  US EDC: 09/18/2021

Subchorionic hemorrhage: Large subchorionic hemorrhage along the
right superior aspect of the gestational sac measuring 1.8 x 1.0 x
2.8 cm.

Maternal uterus/adnexae: Right ovary measures 2.1 x 1.5 x 1.4 cm and
the left ovary measures 4.0 x 2.3 x 2.7 cm. No adnexal masses. Trace
free fluid.
IMPRESSION: 1. Single live intrauterine pregnancy as above estimated age 6 weeks
and 3 days.
2. Large subchorionic hemorrhage.

## 2024-02-07 ENCOUNTER — Ambulatory Visit (HOSPITAL_BASED_OUTPATIENT_CLINIC_OR_DEPARTMENT_OTHER): Admitting: Family Medicine

## 2024-02-07 ENCOUNTER — Other Ambulatory Visit (HOSPITAL_BASED_OUTPATIENT_CLINIC_OR_DEPARTMENT_OTHER): Payer: Self-pay

## 2024-02-07 ENCOUNTER — Ambulatory Visit (HOSPITAL_BASED_OUTPATIENT_CLINIC_OR_DEPARTMENT_OTHER)

## 2024-02-07 VITALS — BP 132/82 | HR 67 | Ht 68.0 in | Wt 247.9 lb

## 2024-02-07 DIAGNOSIS — M79671 Pain in right foot: Secondary | ICD-10-CM | POA: Insufficient documentation

## 2024-02-07 DIAGNOSIS — G8929 Other chronic pain: Secondary | ICD-10-CM

## 2024-02-07 DIAGNOSIS — M542 Cervicalgia: Secondary | ICD-10-CM

## 2024-02-07 DIAGNOSIS — Z7689 Persons encountering health services in other specified circumstances: Secondary | ICD-10-CM

## 2024-02-07 DIAGNOSIS — Z23 Encounter for immunization: Secondary | ICD-10-CM | POA: Diagnosis not present

## 2024-02-07 MED ORDER — MELOXICAM 15 MG PO TABS
15.0000 mg | ORAL_TABLET | Freq: Every day | ORAL | 0 refills | Status: AC
  Filled 2024-02-07: qty 14, 14d supply, fill #0

## 2024-02-07 NOTE — Patient Instructions (Signed)
 Arch Support for Foot Pain  Good supportive shoes: Asics, new Balance, Hoka, Dansko

## 2024-02-07 NOTE — Progress Notes (Signed)
 New Patient Office Visit  Subjective:   Marisa Page 1996/03/04 02/07/2024  Chief Complaint  Patient presents with   New Patient (Initial Visit)    Patient is here today to get established with the practice. Has had occasional foot pain and back pain.    Discussed the use of AI scribe software for clinical note transcription with the patient, who gave verbal consent to proceed.  History of Present Illness Marisa Page is a 28 year old female who presents to establish care with PCP and concerns with foot and back pain.  RIGHT FOOT PAIN:  She has experienced right foot pain over the years, alternating between feet, but currently more pronounced in one foot. The most recent injury involved a suspected sprain of the pinky toe after hitting it against a door frame while running after her child. The toe was bruised and immobile for some time but has since improved, although it lacks the same dexterity as the other toes. She recalls an incident two to three years ago when a wagon loaded with water  cases rolled over the top of her foot, causing ongoing issues. The pain is primarily located on the top of the foot and sometimes affects certain toes. Relief is found with ice, warm and cold compressions, and elevating the foot. The pain worsens with prolonged standing or walking, and she typically wears Nike slides for footwear.  BACK PAIN:  She experiences back pain, which she attributes to epidural pain from a previous C-section and possibly due to her chest size. She describes a pins and needles sensation in her shoulders and reports that her arms and fingers sometimes fall asleep. She notes a history of a slip and fall in 2021, which she believes may have contributed to her back issues, including sciatic nerve pain. She occasionally performs stretches, such as the frog stretch, but not as frequently as she should.She reports constant neck and shoulder pain contributing  to intermittent numbness in bilateral hands.     The following portions of the patient's history were reviewed and updated as appropriate: past medical history, past surgical history, family history, social history, allergies, medications, and problem list.   Patient Active Problem List   Diagnosis Date Noted   Right foot pain 02/07/2024   Back pain 03/27/2023   Symptomatic mammary hypertrophy 03/27/2023   Chronic neck pain 03/27/2023   Hidradenitis 01/30/2023   Cesarean delivery delivered 09/12/2021   Past Medical History:  Diagnosis Date   Allergy Not sure   Pineapple & penicillin   Anxiety 01/31/2024   Depression Whenni was a child   Substance abuse [facility])    Past Surgical History:  Procedure Laterality Date   CESAREAN SECTION N/A 09/12/2021   Procedure: CESAREAN SECTION;  Surgeon: Lola Donnice HERO, MD;  Location: [facility] LD ORS;  Service: Obstetrics;  Laterality: N/A;   WISDOM TOOTH EXTRACTION     Family History  Problem Relation Age of Onset   Thyroid disease Mother    Diabetes Father    Stroke Father    Lung cancer Maternal Grandmother    Hyperthyroidism Maternal Aunt    Social History   Socioeconomic History   Marital status: Significant Other    Spouse name: [name]   Number of children: 1   Years of education: Not on file   Highest education level: Some college, no degree  Occupational History   Occupation: Therapist, occupational    Comment: Press photographer  Tobacco Use   Smoking status:  Every Day    Types: Cigars   Smokeless tobacco: Never   Tobacco comments:    Its mixed with marijuana  Vaping Use   Vaping status: Never Used  Substance and Sexual Activity   Alcohol use: Yes    Alcohol/week: 6.0 standard drinks of alcohol    Types: 6 Glasses of wine per week    Comment: occ   Drug use: Not Currently    Frequency: 14.0 times per week    Types: Marijuana    Comment: Last time 07/2021   Sexual activity: Yes    Birth control/protection: None  Other Topics  Concern   Not on file  Social History Narrative   ** Merged History Encounter **       Social Drivers of Health   Financial Resource Strain: Low Risk  (02/07/2024)   Overall Financial Resource Strain (CARDIA)    Difficulty of Paying Living Expenses: Not very hard  Food Insecurity: Food Insecurity Present (02/07/2024)   Hunger Vital Sign    Worried About Running Out of Food in the Last Year: Sometimes true    Ran Out of Food in the Last Year: Never true  Transportation Needs: No Transportation Needs (02/07/2024)   PRAPARE - Administrator, Civil Service (Medical): No    Lack of Transportation (Non-Medical): No  Physical Activity: Inactive (02/07/2024)   Exercise Vital Sign    Days of Exercise per Week: 0 days    Minutes of Exercise per Session: Not on file  Stress: Stress Concern Present (02/07/2024)   [employer] of Occupational Health - Occupational Stress Questionnaire    Feeling of Stress: Very much  Social Connections: Moderately Isolated (02/07/2024)   Social Connection and Isolation Panel    Frequency of Communication with Friends and Family: More than three times a week    Frequency of Social Gatherings with Friends and Family: Twice a week    Attends Religious Services: Never    Database administrator or Organizations: No    Attends Engineer, structural: Not on file    Marital Status: Living with partner  Intimate Partner Violence: Not At Risk (01/17/2021)   Humiliation, Afraid, Rape, and Kick questionnaire    Fear of Current or Ex-Partner: No    Emotionally Abused: No    Physically Abused: No    Sexually Abused: No   No outpatient medications prior to visit.   No facility-administered medications prior to visit.   Allergies  Allergen Reactions   Penicillins Hives    Has patient had a PCN reaction causing immediate rash, facial/tongue/throat swelling, SOB or lightheadedness with hypotension: No Has patient had a PCN reaction causing severe  rash involving mucus membranes or skin necrosis: Unknown Has patient had a PCN reaction that required hospitalization: No Has patient had a PCN reaction occurring within the last 10 years: No If all of the above answers are NO, then may proceed with Cephalosporin use.     ROS: A complete ROS was performed with pertinent positives/negatives noted in the HPI. The remainder of the ROS are negative.   Objective:   Today's Vitals   02/07/24 1106  BP: 132/82  Pulse: 67  SpO2: 100%  Weight: 247 lb 14.4 oz (112.4 kg)  Height: 5' 8 (1.727 m)    GENERAL: Well-appearing, in NAD. Well nourished.  SKIN: Pink, warm and dry.  Head: Normocephalic. NECK: Trachea midline. Full ROM w/o pain or tenderness. RESPIRATORY: Chest wall symmetrical. Respirations even and non-labored.  MSK: Muscle tone and strength appropriate for age. Joints w/o tenderness, redness, or swelling. No step off, deformity or crepitus to spine. Tight muscle banding and mild tenderness to bilateral trapezius muscles. Full grip strength and full ROM to BUE. Full ROM present to right foot. No deformity or palpable tenderness.  EXTREMITIES: Without clubbing, cyanosis, or edema.  NEUROLOGIC: No motor or sensory deficits. Steady, even gait. C2-C12 intact.  PSYCH/MENTAL STATUS: Alert, oriented x 3. Cooperative, appropriate mood and affect.      Assessment & Plan:  1. Encounter to establish care with new doctor (Primary) Discussed role of PCP, reviewed medical, surgical and family history.   2. Chronic neck pain Possible nerve impingement contributing to numbness and weakness of upper extremities.  Will obtain x-ray of cervical spine today.  Recommend she start NSAID therapy such as Mobic for at least 14 days taking with food.  Stretches provided and recommend ice or heat therapy as well. - DG Cervical Spine Complete; Future - meloxicam (MOBIC) 15 MG tablet; Take 1 tablet (15 mg total) by mouth daily for 14 days.  Dispense: 14  tablet; Refill: 0  3. Immunization due - Flu vaccine trivalent PF, 6mos and older(Flulaval,Afluria,Fluarix,Fluzone)  4. Right foot pain Discussed possible traumas in the past to right foot that may be contributing to chronic pain.  Recommend use of NSAID, ice or heat, and supportive footwear for improvement.  Also recommended adding arch support to take pressure off from midfoot and toes.  Patient to reach out to office if new, worrisome, or unresolved symptoms arise or if no improvement in patient's condition. Patient verbalized understanding and is agreeable to treatment plan. All questions answered to patient's satisfaction.    Return in about 3 months (around 05/09/2024) for ANNUAL PHYSICAL.    Thersia Schuyler Stark, [place]

## 2024-02-13 ENCOUNTER — Ambulatory Visit (HOSPITAL_BASED_OUTPATIENT_CLINIC_OR_DEPARTMENT_OTHER): Payer: Self-pay | Admitting: Family Medicine

## 2024-02-13 NOTE — Progress Notes (Signed)
 Hi [name],  Your x-ray of the cervical spine is negative for any fracture or swelling.  There are no alignment issues present.  I would recommend continue with the Mobic, stretches, and alternating ice and heat and if no improvement reaching out to the clinic.

## 2024-03-03 IMAGING — US US MFM OB FOLLOW-UP
1 series · 14 of 28 positions shown · non-contrast
Comparison: none

[Series 1: us mfm ob follow-up · 14 of 37 slices shown]
[im 2/37]
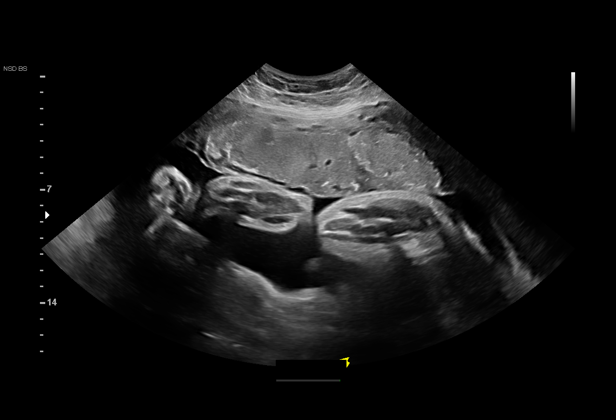
[im 5/37]
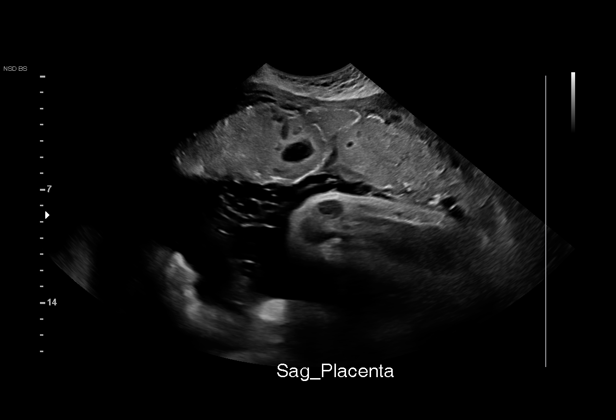
[im 7/37]
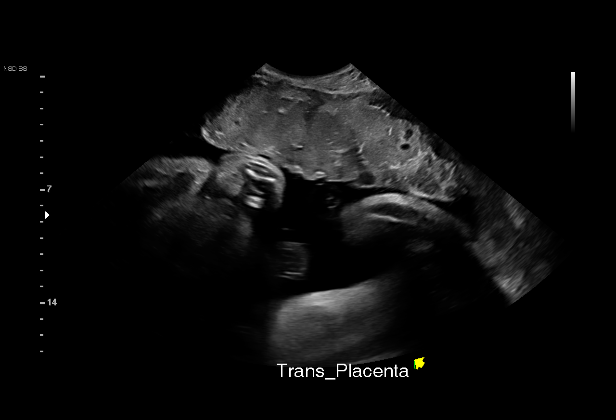
[im 10/37]
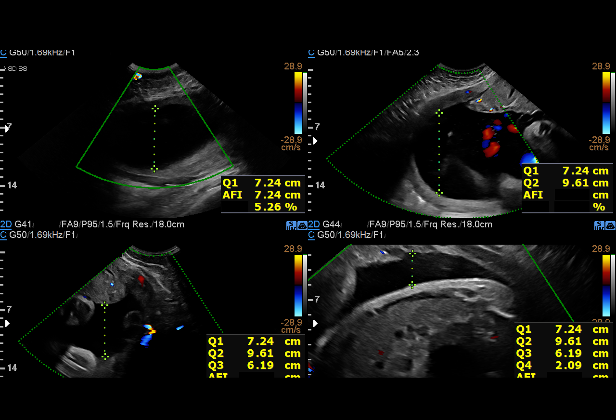
[im 13/37]
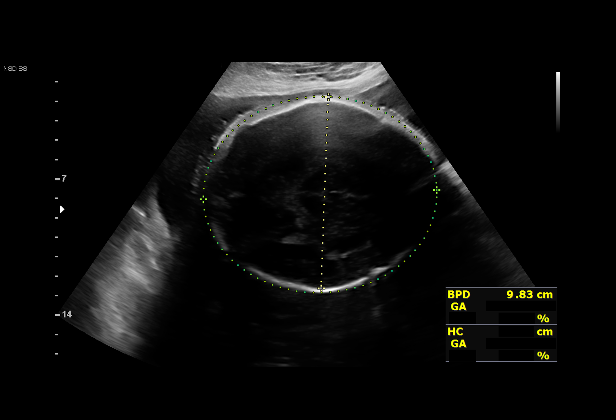
[im 15/37]
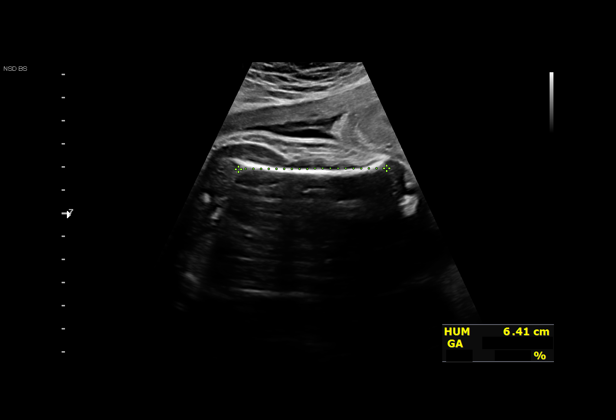
[im 18/37]
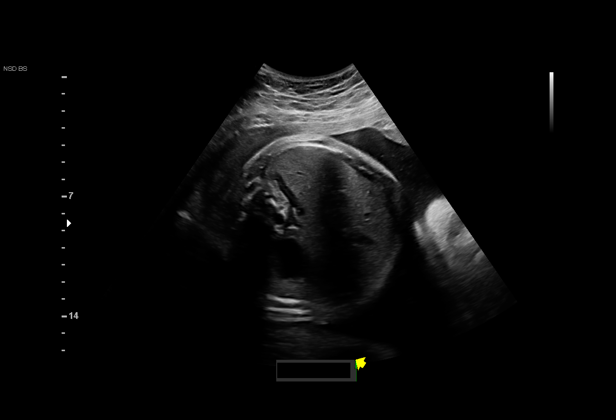
[im 21/37]
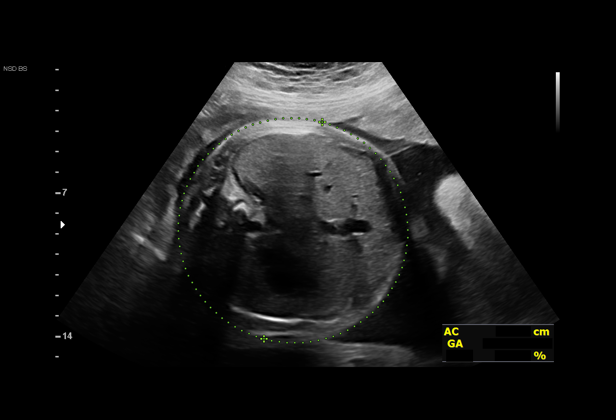
[im 23/37]
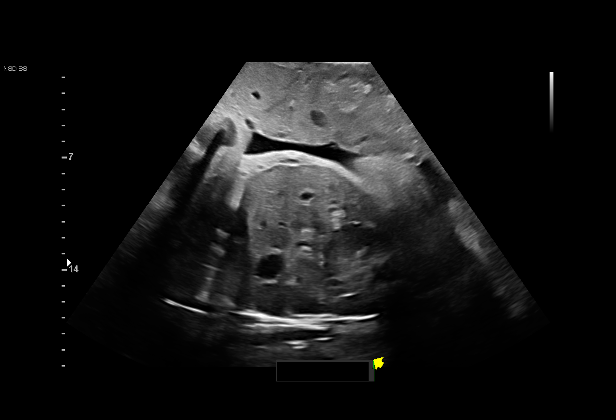
[im 26/37]
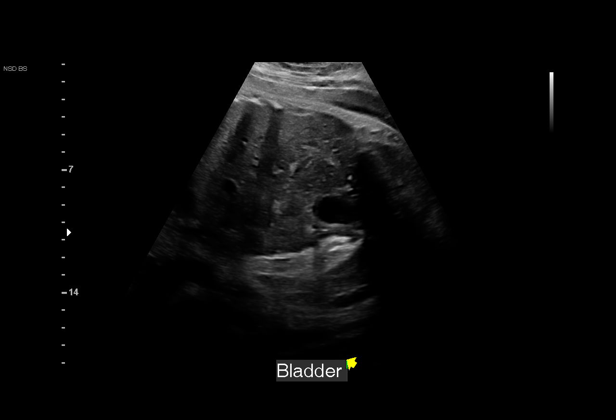
[im 29/37]
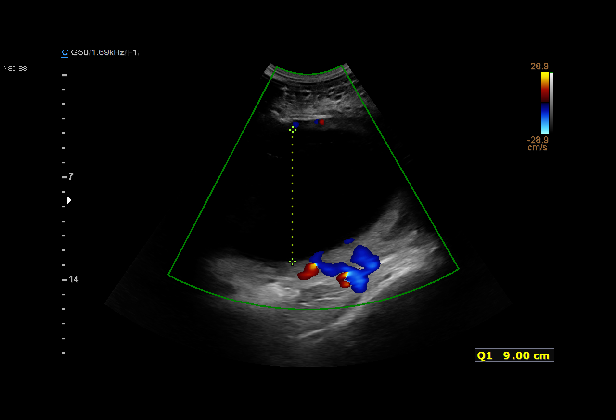
[im 31/37]
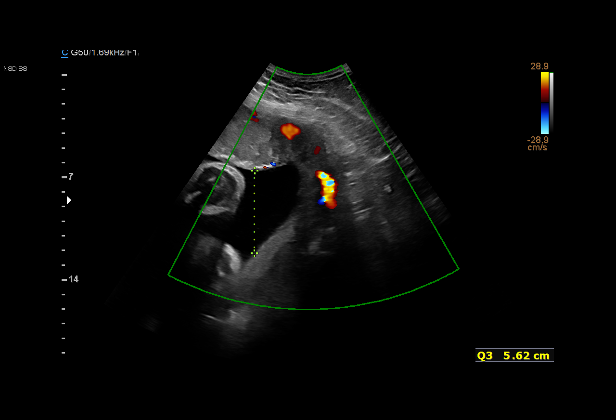
[im 34/37]
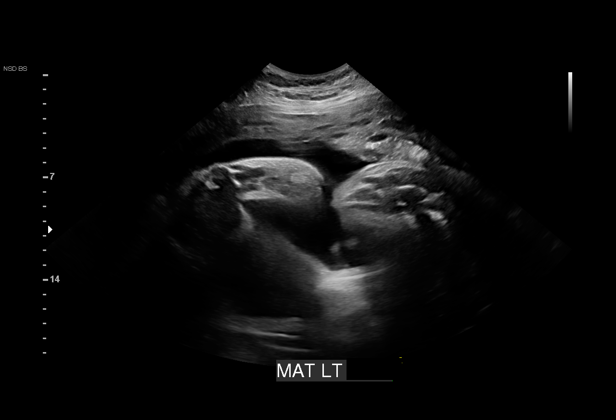
[im 37/37]
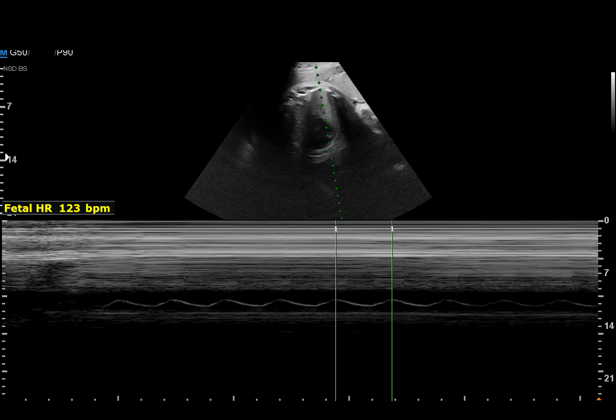

[14 of 28 positions shown; findings below may reference images not displayed]

Addendum:\.br----------------------------------------------------------------------
----------------------------------------------------------------------

             [facility]
----------------------------------------------------------------------

 Attending:        Kashieka Kulwant        Secondary Phy.:   [facility]
                                                            Suites
----------------------------------------------------------------------

----------------------------------------------------------------------

----------------------------------------------------------------------
Indications

 Decreased fetal movement
 Determine Fetal presentation by ultrasound
 39 weeks gestation of pregnancy
 Encounter for other antenatal screening
 follow-up (EFW)
----------------------------------------------------------------------
Fetal Evaluation

 Num Of Fetuses:         1
 Fetal Heart Rate(bpm):  132
 Cardiac Activity:       Observed
 Presentation:           Frank breech
 Placenta:               Anterior
 P. Cord Insertion:      Not well visualized

 Amniotic Fluid
 AFI FV:      Polyhydramnios

 AFI Sum(cm)     %Tile       Largest Pocket(cm)
 32.7            > 97

 RUQ(cm)       RLQ(cm)       LUQ(cm)        LLQ(cm)
 9
----------------------------------------------------------------------
Biophysical Evaluation

 Amniotic F.V:   Pocket => 2 cm             F. Tone:        Observed
 F. Movement:    Observed                   Score:          [DATE]
 F. Breathing:   Not Observed
----------------------------------------------------------------------
Biometry

 BPD:      98.5  mm     G. Age:  40w 3d         95  %    CI:        79.53   %    70 - 86
                                                         FL/HC:      21.1   %    20.6 -
 HC:      349.1  mm     G. Age:  40w 4d         69  %    HC/AC:      1.00        0.87 -
 AC:      348.3  mm     G. Age:  38w 5d         55  %    FL/BPD:     74.7   %    71 - 87
 FL:       73.6  mm     G. Age:  37w 5d         18  %    FL/AC:      21.1   %    20 - 24
 HUM:      63.4  mm     G. Age:  36w 6d         27  %

 LV:          9  mm

 Est. FW:    9299  gm           8 lb     60  %
----------------------------------------------------------------------
OB History

 Blood Type:   A+
 Gravidity:    3         Term:   1        Prem:   0        SAB:   1
 TOP:          0       Ectopic:  0        Living: 1
----------------------------------------------------------------------
Gestational Age

 LMP:           39w 3d        Date:  12/10/20                  EDD:   09/16/21
 U/S Today:     39w 3d                                        EDD:   09/16/21
 Best:          39w 3d     Det. By:  LMP  (12/10/20)          EDD:   09/16/21
----------------------------------------------------------------------
Anatomy

 Cranium:               Appears normal         Aortic Arch:            Previously seen
 Cavum:                 Previously seen        Ductal Arch:            Previously seen
 Ventricles:            Appears normal         Diaphragm:              Appears normal
 Choroid Plexus:        Previously seen        Stomach:                Appears normal, left
                                                                       sided
 Cerebellum:            Previously seen        Abdomen:                Appears normal
 Posterior Fossa:       Previously seen        Abdominal Wall:         Previously seen
 Nuchal Fold:           Previously seen        Cord Vessels:           Previously seen
 Face:                  Appears normal         Kidneys:                Appear normal
                        (orbits and profile)
 Lips:                  Previously seen        Bladder:                Appears normal
 Thoracic:              Appears normal         Spine:                  Previously seen
 Heart:                 Previously seen; EIF   Upper Extremities:      Previously seen
 RVOT:                  Previously seen        Lower Extremities:      Previously seen
 LVOT:                  Previously seen

 Other:  VC, 3VV and 3VTV visualized. Fetus appears to be a male. 5th digits,
         Heels/feet previously visualized. Technically difficult due to maternal
         habitus and fetal position.
----------------------------------------------------------------------
Cervix Uterus Adnexa

 Cervix
 Not visualized (advanced GA >55wks)
----------------------------------------------------------------------
Impression
 Patient was evaluated at the [facility] for complaints of
 decreased fetal movements.
 On ultrasound, severe polyhydramnios is seen (AFI 33 cm).
 Fetal breathing movements did not meet the criteria of BPP.
 Frank breech presentation.  BPP [DATE].

----------------------------------------------------------------------
                     Tiwiram, Grey Pilar
----------------------------------------------------------------------

*** End of Addendum ***\.br----------------------------------------------------------------------
----------------------------------------------------------------------

             [facility]
----------------------------------------------------------------------

 Attending:        Kashieka Kulwant        Secondary Phy.:   [facility]
                                                            Suites
----------------------------------------------------------------------

----------------------------------------------------------------------

----------------------------------------------------------------------
Indications

 Decreased fetal movement
 Determine Fetal presentation by ultrasound
 39 weeks gestation of pregnancy
 Encounter for other antenatal screening
 follow-up (EFW)
----------------------------------------------------------------------
Fetal Evaluation

 Num Of Fetuses:         1
 Fetal Heart Rate(bpm):  132
 Cardiac Activity:       Observed
 Presentation:           Frank breech
 Placenta:               Anterior
 P. Cord Insertion:      Not well visualized

 Amniotic Fluid
 AFI FV:      Polyhydramnios

 AFI Sum(cm)     %Tile       Largest Pocket(cm)
 32.7            > 97

 RUQ(cm)       RLQ(cm)       LUQ(cm)        LLQ(cm)
 9
----------------------------------------------------------------------
Biophysical Evaluation
 Amniotic F.V:   Pocket => 2 cm             F. Tone:        Observed
 F. Movement:    Observed                   Score:          [DATE]
 F. Breathing:   Not Observed
----------------------------------------------------------------------
Biometry

 BPD:      98.5  mm     G. Age:  40w 3d         95  %    CI:        79.53   %    70 - 86
                                                         FL/HC:      21.1   %    20.6 -
 HC:      349.1  mm     G. Age:  40w 4d         69  %    HC/AC:      1.00        0.87 -
 AC:      348.3  mm     G. Age:  38w 5d         55  %    FL/BPD:     74.7   %    71 - 87
 FL:       73.6  mm     G. Age:  37w 5d         18  %    FL/AC:      21.1   %    20 - 24
 HUM:      63.4  mm     G. Age:  36w 6d         27  %
 LV:          9  mm

 Est. FW:    9299  gm           8 lb     60  %
----------------------------------------------------------------------
OB History

 Blood Type:   A+
 Gravidity:    3         Term:   1        Prem:   0        SAB:   1
 TOP:          0       Ectopic:  0        Living: 1
----------------------------------------------------------------------
Gestational Age

 LMP:           39w 3d        Date:  12/10/20                  EDD:   09/16/21
 U/S Today:     39w 3d                                        EDD:   09/16/21
 Best:          39w 3d     Det. By:  LMP  (12/10/20)          EDD:   09/16/21
----------------------------------------------------------------------
Anatomy

 Cranium:               Appears normal         Aortic Arch:            Previously seen
 Cavum:                 Previously seen        Ductal Arch:            Previously seen
 Ventricles:            Appears normal         Diaphragm:              Appears normal
 Choroid Plexus:        Previously seen        Stomach:                Appears normal, left
                                                                       sided
 Cerebellum:            Previously seen        Abdomen:                Appears normal
 Posterior Fossa:       Previously seen        Abdominal Wall:         Previously seen
 Nuchal Fold:           Previously seen        Cord Vessels:           Previously seen
 Face:                  Appears normal         Kidneys:                Appear normal
                        (orbits and profile)
 Lips:                  Previously seen        Bladder:                Appears normal
 Thoracic:              Appears normal         Spine:                  Previously seen
 Heart:                 Previously seen; EIF   Upper Extremities:      Previously seen
 RVOT:                  Previously seen        Lower Extremities:      Previously seen
 LVOT:                  Previously seen

 Other:  VC, 3VV and 3VTV visualized. Fetus appears to be a male. 5th digits,
         Heels/feet previously visualized. Technically difficult due to maternal
         habitus and fetal position.
----------------------------------------------------------------------
Cervix Uterus Adnexa

 Cervix
 Not visualized (advanced GA >55wks)
----------------------------------------------------------------------
Impression
 Patient was evaluated at the [facility] for complaints of
 decreased fetal movements.
 On ultrasound, severe polyhydramnios is seen (AFI 33 cm).
 Fetal breathing movements did not meet the criteria of BPP.
 Frank breech presentation.  BPP [DATE].

----------------------------------------------------------------------
                Tiwiram, Grey Pilar
----------------------------------------------------------------------

## 2024-03-05 ENCOUNTER — Encounter (INDEPENDENT_AMBULATORY_CARE_PROVIDER_SITE_OTHER): Payer: Self-pay | Admitting: Family Medicine

## 2024-03-05 DIAGNOSIS — M542 Cervicalgia: Secondary | ICD-10-CM

## 2024-03-05 DIAGNOSIS — M79671 Pain in right foot: Secondary | ICD-10-CM

## 2024-03-05 DIAGNOSIS — G8929 Other chronic pain: Secondary | ICD-10-CM

## 2024-03-06 NOTE — Telephone Encounter (Signed)
 Please see mychart message sent by pt and advise.

## 2024-03-10 NOTE — Telephone Encounter (Signed)
 Please see the MyChart message reply(ies) for my assessment and plan.    This patient gave consent for this Medical Advice Message and is aware that it may result in a bill to [employer], as well as the possibility of receiving a bill for a co-payment or deductible. They are an established patient, but are not seeking medical advice exclusively about a problem treated during an in person or video visit in the last seven days. I did not recommend an in person or video visit within seven days of my reply.    I spent a total of 6 minutes cumulative time within 7 days through [employer].  Thersia Schuyler Stark, [place]

## 2024-03-13 ENCOUNTER — Ambulatory Visit (HOSPITAL_BASED_OUTPATIENT_CLINIC_OR_DEPARTMENT_OTHER)

## 2024-03-13 ENCOUNTER — Ambulatory Visit (INDEPENDENT_AMBULATORY_CARE_PROVIDER_SITE_OTHER): Admitting: Student

## 2024-03-13 DIAGNOSIS — M79671 Pain in right foot: Secondary | ICD-10-CM | POA: Diagnosis not present

## 2024-03-13 DIAGNOSIS — G8929 Other chronic pain: Secondary | ICD-10-CM | POA: Diagnosis not present

## 2024-03-13 DIAGNOSIS — M542 Cervicalgia: Secondary | ICD-10-CM

## 2024-03-13 NOTE — Progress Notes (Signed)
 Chief Complaint: Chronic neck and right foot pain    Discussed the use of AI scribe software for clinical note transcription with the patient, who gave verbal consent to proceed.  History of Present Illness Marisa Page is a 28 year old female who presents with chronic neck and right foot pain.  She experiences chronic neck pain affecting the top and middle of her spine, radiating into the left shoulder blade and arm, with numbness and a burning sensation. The pain worsens with certain movements and positions, especially during sleep, causing numbness and 'pins and needles' in her arm. She uses peppermint oil for temporary relief and performs stretches to alleviate stiffness, particularly on the left side. Previous use of meloxicam for inflammation caused drowsiness.  Chronic right foot pain began after multiple injuries over the past two years, located on the outer side of the foot around the pinky toe, sometimes shooting into the ankle and lower leg. The pain is sharp and worsens with certain movements. She uses homemade splints, ice, heat compressions, and currently relies on heat and elevation for relief. A slip and fall in 2021 may have contributed to her foot issues. She most often wears tennis shoes or slides. 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   Surgical History:   None  PMH/PSH/Family History/Social History/Meds/Allergies:    Past Medical History:  Diagnosis Date   Allergy Not sure   Pineapple & penicillin   Anxiety 01/31/2024   Depression Whenni was a child   Substance abuse [facility])    Past Surgical History:  Procedure Laterality Date   CESAREAN SECTION N/A 09/12/2021   Procedure: CESAREAN SECTION;  Surgeon: Lola Donnice HERO, MD;  Location: [facility] LD ORS;  Service: Obstetrics;  Laterality: N/A;   WISDOM TOOTH EXTRACTION      Social History   Socioeconomic History   Marital status: Significant Other    Spouse name: [name]   Number of children: 1   Years of education: Not on file   Highest education level: Some college, no degree  Occupational History   Occupation: Therapist, Occupational    Comment: Press photographer  Tobacco Use   Smoking status: Every Day    Types: Cigars   Smokeless tobacco: Never   Tobacco comments:    Its mixed with marijuana  Vaping Use   Vaping status: Never Used  Substance and Sexual Activity   Alcohol use: Yes    Alcohol/week: 6.0 standard drinks of alcohol    Types: 6 Glasses of wine per week    Comment: occ   Drug use: Not Currently    Frequency: 14.0 times per week    Types: Marijuana    Comment: Last time 07/2021   Sexual activity: Yes    Birth control/protection: None  Other Topics Concern   Not on file  Social History Narrative   ** Merged History Encounter **       Social Drivers of Health   Financial Resource Strain: Low Risk  (02/07/2024)   Overall Financial Resource Strain (CARDIA)    Difficulty of Paying Living Expenses: Not very hard  Food Insecurity: Food Insecurity Present (02/07/2024)   Hunger Vital Sign    Worried About Programme Researcher, Broadcasting/film/video in  the Last Year: Sometimes true    Ran Out of Food in the Last Year: Never true  Transportation Needs: No Transportation Needs (02/07/2024)   PRAPARE - Administrator, Civil Service (Medical): No    Lack of Transportation (Non-Medical): No  Physical Activity: Inactive (02/07/2024)   Exercise Vital Sign    Days of Exercise per Week: 0 days    Minutes of Exercise per Session: Not on file  Stress: Stress Concern Present (02/07/2024)   [employer] of Occupational Health - Occupational Stress Questionnaire    Feeling of Stress: Very much  Social Connections: Moderately Isolated (02/07/2024)   Social Connection and Isolation Panel    Frequency of Communication with Friends and Family: More than three  times a week    Frequency of Social Gatherings with Friends and Family: Twice a week    Attends Religious Services: Never    Database Administrator or Organizations: No    Attends Engineer, Structural: Not on file    Marital Status: Living with partner   Family History  Problem Relation Age of Onset   Thyroid disease Mother    Diabetes Father    Stroke Father    Lung cancer Maternal Grandmother    Hyperthyroidism Maternal Aunt    Allergies  Allergen Reactions   Penicillins Hives    Has patient had a PCN reaction causing immediate rash, facial/tongue/throat swelling, SOB or lightheadedness with hypotension: No Has patient had a PCN reaction causing severe rash involving mucus membranes or skin necrosis: Unknown Has patient had a PCN reaction that required hospitalization: No Has patient had a PCN reaction occurring within the last 10 years: No If all of the above answers are NO, then may proceed with Cephalosporin use.    No current outpatient medications on file.   No current facility-administered medications for this visit.   No results found.  Review of Systems:   A ROS was performed including pertinent positives and negatives as documented in the HPI.  Physical Exam :   Constitutional: NAD and appears stated age Neurological: Alert and oriented Psych: Appropriate affect and cooperative There were no vitals taken for this visit.   Comprehensive Musculoskeletal Exam:    Tenderness with palpation throughout the cervical paraspinal musculature and upper trapezius bilaterally, worse left than right.  Full cervical range of motion with slight discomfort noted with rotation to the left.  Grip strength 5/5 bilaterally. Exam of the right foot demonstrates some tenderness laterally over the fifth metatarsal and fifth digit.  Patient does have notable pes planus deformity.  No tenderness in the medial foot or ankle.  DP pulse 2+.  Imaging:   Xray (right foot 3  views): Questionable subacute minimally displaced fracture of the distal fifth proximal phalanx.  No evidence of acute bony abnormalities.  Pes planus.  Xray review from 02/07/2024 (cervical spine 4 views): Normal-appearing radiographs of the cervical spine without bony abnormality   I personally reviewed and interpreted the radiographs.      Assessment & Plan Chronic right foot pain with history of prior trauma and flatfoot   Chronic pain is localized to the outer foot, possibly due to an old pinky toe fracture and flatfoot contributing to pronation. Trial flatfoot insoles for added support and use consistently for a few weeks to assess improvement. Follow up if no improvement.  Chronic neck pain with left cervical radiculopathy and myofascial symptoms   Chronic neck pain with left cervical radiculopathy and myofascial  symptoms likely due to muscular tension. X-ray is unremarkable.  She describes that a breast reduction procedure has been mentioned in the past, although she is not interested in proceeding with this at this time although this may also be contributing to thoracic myofascial symptoms.  She avoids medications due to drowsiness.  Patient is set to begin physical therapy at [facility] in January as referred by PCP and will continue with this as planned.      I personally saw and evaluated the patient, and participated in the management and treatment plan.  Leonce Reveal, PA-C Orthopedics

## 2024-03-21 ENCOUNTER — Ambulatory Visit: Payer: Self-pay

## 2024-03-21 NOTE — Telephone Encounter (Signed)
 Routing to [place] for further review.

## 2024-03-21 NOTE — Telephone Encounter (Signed)
 FYI Only or Action Required?: FYI only for provider: appointment scheduled on 04/07/2024.  Patient was last seen in primary care on 02/07/2024 by Knute Thersia Bitters, FNP.  Called Nurse Triage reporting Breast Pain.  Symptoms began several years ago.  Interventions attempted: Nothing.  Symptoms are: gradually worsening.  Triage Disposition: See PCP Within 2 Weeks  Patient/caregiver understands and will follow disposition?: Yes      Copied from CRM [id number]. Topic: Clinical - Red Word Triage >> Mar 21, 2024  9:11 AM Marisa Page wrote: [facility] that prompted transfer to Nurse Triage: Bilateral severe pian/burning in both breast. Reason for Disposition  [1] Breast pain AND [2] cause is not known  Answer Assessment - Initial Assessment Questions 1. SYMPTOM: What's the main symptom you're concerned about?  (e.g., lump, nipple discharge, pain, rash)     Pain feels like a burning sensation  2. LOCATION: Where is the pain located?     In both breast  3. ONSET: When did pain  start?     Years  4. PRIOR HISTORY: Do you have any history of prior problems with your breasts? (e.g., breast cancer, breast implant, fibrocystic breast disease)     Was supposed to get a reduction due to symptoms, infections with nipple piercing and breast feeding issues  5. CAUSE: What do you think is causing this symptom?     Unsure  6. OTHER SYMPTOMS: Do you have any other symptoms? (e.g., breast pain, fever, nipple discharge, redness or rash)     Some redness from time to time.  7. PREGNANCY-BREASTFEEDING: Is there any chance you are pregnant? When was your last menstrual period? Are you breastfeeding?     Not breast feeding currently  Protocols used: Breast Symptoms-A-AH

## 2024-03-25 ENCOUNTER — Ambulatory Visit (INDEPENDENT_AMBULATORY_CARE_PROVIDER_SITE_OTHER): Admitting: Family Medicine

## 2024-03-25 VITALS — BP 115/69 | HR 66 | Ht 68.0 in | Wt 248.0 lb

## 2024-03-25 DIAGNOSIS — F3281 Premenstrual dysphoric disorder: Secondary | ICD-10-CM | POA: Diagnosis not present

## 2024-03-25 DIAGNOSIS — N644 Mastodynia: Secondary | ICD-10-CM | POA: Diagnosis not present

## 2024-03-25 DIAGNOSIS — G8929 Other chronic pain: Secondary | ICD-10-CM | POA: Diagnosis not present

## 2024-03-25 NOTE — Progress Notes (Signed)
 Acute Care Office Visit  Subjective:   Marisa Page 1995-09-15 03/25/2024  Chief Complaint  Patient presents with   Breast Pain    Pt has been having pain in both breaths for awhile but states the pain is progressively getting worse. States the pain has been off an on for the past 2 years and at times her breasts will be warm to the touch and also would be tender to the touch.    HPI: Discussed the use of AI scribe software for clinical note transcription with the patient, who gave verbal consent to proceed.  History of Present Illness Marisa Page is a 28 year old female who presents with bilateral breast pain.  She experiences fluctuating pain in both breasts, particularly around the nipples and the bottom of the breasts. The pain is described as severe, making it difficult to bump into things or pick up her child without discomfort. She notes a sensation of a mass, although she acknowledges it may be a mass of fat. Occasionally, the breasts feel hot to the touch and swollen. The pain tends to worsen around her menstrual cycle, particularly the week before and during her period.  She has a history of nipple piercings and breastfeeding, which she feels may contribute to her symptoms. She stopped breastfeeding approximately seven to eight months ago and has not noticed any nipple discharge. No current nipple drainage or discharge is reported.  She experiences significant emotional distress during her menstrual cycle and the week prior, including crying and feeling 'slightly suicidal'. She has not been on any birth control or medications like sertraline or Zoloft since high school, when she was on the Depo shot for a short period.  Patient's last menstrual period was 03/05/2024 (approximate).     The following portions of the patient's history were reviewed and updated as appropriate: past medical history, past surgical history, family history,  social history, allergies, medications, and problem list.   Patient Active Problem List   Diagnosis Date Noted   Right foot pain 02/07/2024   Back pain 03/27/2023   Symptomatic mammary hypertrophy 03/27/2023   Chronic neck pain 03/27/2023   Hidradenitis 01/30/2023   Cesarean delivery delivered 09/12/2021   Past Medical History:  Diagnosis Date   Allergy Not sure   Pineapple & penicillin   Anxiety 01/31/2024   Depression Marisa Page was a child   Substance abuse [facility])    Past Surgical History:  Procedure Laterality Date   CESAREAN SECTION N/A 09/12/2021   Procedure: CESAREAN SECTION;  Surgeon: Lola Donnice HERO, MD;  Location: [facility] LD ORS;  Service: Obstetrics;  Laterality: N/A;   WISDOM TOOTH EXTRACTION     Family History  Problem Relation Age of Onset   Thyroid disease Mother    Diabetes Father    Stroke Father    Lung cancer Maternal Grandmother    Hyperthyroidism Maternal Aunt    No outpatient medications prior to visit.   No facility-administered medications prior to visit.   Allergies  Allergen Reactions   Penicillins Hives    Has patient had a PCN reaction causing immediate rash, facial/tongue/throat swelling, SOB or lightheadedness with hypotension: No Has patient had a PCN reaction causing severe rash involving mucus membranes or skin necrosis: Unknown Has patient had a PCN reaction that required hospitalization: No Has patient had a PCN reaction occurring within the last 10 years: No If all of the above answers are NO, then may proceed with Cephalosporin use.  ROS: A complete ROS was performed with pertinent positives/negatives noted in the HPI. The remainder of the ROS are negative.    Objective:   Today's Vitals   03/25/24 1100  BP: 115/69  Pulse: 66  SpO2: 100%  Weight: 248 lb (112.5 kg)  Height: 5' 8 (1.727 m)    GENERAL: Well-appearing, in NAD. Well nourished.  SKIN: Pink, warm and dry. No rash, lesion, ulceration, or ecchymoses.  Head:  Normocephalic. NECK: Trachea midline. Full ROM w/o pain or tenderness.  BREASTS: Breasts pendulous, symmetrical, and w/o palpable masses. Tenderness present with bilateral palpation. Nipples everted and w/o discharge. No rash or skin retraction. No axillary or supraclavicular lymphadenopathy. Chaperoned by Camie Almarie Angus, FNP, DNP  RESPIRATORY: Chest wall symmetrical. Respirations even and non-labored. Breath sounds clear to auscultation bilaterally.  CARDIAC: S1, S2 present, regular rate and rhythm without murmur or gallops. Peripheral pulses 2+ bilaterally.  MSK: Muscle tone and strength appropriate for age. Joints w/o tenderness, redness, or swelling.  EXTREMITIES: Without clubbing, cyanosis, or edema.  NEUROLOGIC: No motor or sensory deficits. Steady, even gait. C2-C12 intact.  PSYCH/MENTAL STATUS: Alert, oriented x 3. Cooperative, appropriate mood and affect.       Assessment & Plan:  1. Chronic breast pain (Primary) Will obtain US  bilateral breasts given chronic pain and tenderness.  - US  BREAST COMPLETE UNI RIGHT INC AXILLA; Future - US  BREAST COMPLETE UNI LEFT INC AXILLA; Future  2. PMDD (premenstrual dysphoric disorder) Discussed above breast pain is likely related to PMDD as well as emotional symptoms and mood swings at time of premenstrual state and during cycle. PCP recommend starting medication such as Zoloft or consider OCP. Patient declined and would like to obtain US  breasts first prior to starting medication. Safety plan reviewed and counseling recommendations provided in AVS. Denies SI currently.   Return for As scheduled in January 2026.    Patient to reach out to office if new, worrisome, or unresolved symptoms arise or if no improvement in patient's condition. Patient verbalized understanding and is agreeable to treatment plan. All questions answered to patient's satisfaction.    Thersia Schuyler Stark, [place]

## 2024-03-25 NOTE — Patient Instructions (Addendum)
 Schedule with Breast Center   Counseling and Mental Health Resources   [facility]  - For Women and Girls only - Cost based upon sliding scale of income - Financial Aid available  [phone] [address] [place], [place] [address] [facility]  - Mental Health, Substance Abuse Treatment - IV Ketamine, Hydration and Weight Loss Programs - Center for Treatment for Resistant Depression and Suicidal Ideation  [address] [place], [place] [address]  [phone] [email or web address]   [facility]  - Individual and Family Counseling - Assessments and Therapy for Learning Disabilities, ADHD, Autism Spectrum Disorder, Processing Deficits  [phone] [address] [place], [place] [address]  [facility]  [address] [place] [address] [place], [place] [address]  [phone]  [facility]  - Individual, Family, Play and Group Therapy - In person and telehealth sessions available  Phone: [phone] Email: [email or web address]  [place] Location:   [address] [place] [address] [place], [place] [address]   [place] Location:   [address] [place], [place] [address] Denman Rose MA Clinical Psychology  [address] [place] [address]  [phone]  [facility], [facility]  Adult, Adolescent and [facility]  [address], [place], [place] [address]  Text:  [phone]   Call:  [phone] Email: [email or web address] [facility] - [facility]    [facility]  - Individual, Group Therapy - Day Programs, Wellness Coaching - Staff Programming, Workshops  [address] [place], [place] [address]  [phone]  [facility], [facility]  - Children,  Adolescent, Adult and Family Therapy  [place] Location [phone]  [address] [place], [place]  [address]   [place] Location [phone]  [address] [place], [place]  [address]   [facility]  - Indivudual Counseling, [facility] [address] [place], [place] [address]  [phone] [facility]  -Services at less than a basic fee sponsored by [facility]  [address] [place], [place] [address]  [phone]   [facility]  Counseling offered by Psychology Doctoral Students  - Majority of Patients qualify for financial assistance   [address] [place], [place] [address]  [phone]

## 2024-04-07 ENCOUNTER — Ambulatory Visit (HOSPITAL_BASED_OUTPATIENT_CLINIC_OR_DEPARTMENT_OTHER)

## 2024-04-25 ENCOUNTER — Ambulatory Visit
Admission: RE | Admit: 2024-04-25 | Discharge: 2024-04-25 | Disposition: A | Discharge status: Home / Self Care | Source: Ambulatory Visit | Attending: Family Medicine | Admitting: Family Medicine

## 2024-04-25 DIAGNOSIS — G8929 Other chronic pain: Secondary | ICD-10-CM

## 2024-05-09 ENCOUNTER — Ambulatory Visit (HOSPITAL_BASED_OUTPATIENT_CLINIC_OR_DEPARTMENT_OTHER): Payer: Self-pay | Attending: Family Medicine | Admitting: Physical Therapy

## 2024-05-09 ENCOUNTER — Encounter (HOSPITAL_BASED_OUTPATIENT_CLINIC_OR_DEPARTMENT_OTHER): Payer: Self-pay | Admitting: Physical Therapy

## 2024-05-09 ENCOUNTER — Other Ambulatory Visit: Payer: Self-pay

## 2024-05-09 DIAGNOSIS — M542 Cervicalgia: Secondary | ICD-10-CM | POA: Insufficient documentation

## 2024-05-09 DIAGNOSIS — R29898 Other symptoms and signs involving the musculoskeletal system: Secondary | ICD-10-CM | POA: Insufficient documentation

## 2024-05-09 DIAGNOSIS — G8929 Other chronic pain: Secondary | ICD-10-CM | POA: Insufficient documentation

## 2024-05-09 NOTE — Therapy (Signed)
 " OUTPATIENT PHYSICAL THERAPY CERVICAL EVALUATION   [name] MRN: [id number] DOB:[birth year], 29 y.o., female Today's Date: 05/09/2024  END OF SESSION:  PT End of Session - 05/09/24 1346     Visit Number 1    Number of Visits 16    Date for Recertification  07/04/24    Authorization Type [place] MEDICAID HEALTHY BLUE    PT Start Time 1346    PT Stop Time 1427    PT Time Calculation (min) 41 min    Activity Tolerance Patient tolerated treatment well    Behavior During Therapy [facility] for tasks assessed/performed          Past Medical History:  Diagnosis Date   Allergy Not sure   Pineapple & penicillin   Anxiety 01/31/2024   Depression Whenni was a child   Substance abuse (HCC)    Past Surgical History:  Procedure Laterality Date   CESAREAN SECTION N/A 09/12/2021   Procedure: CESAREAN SECTION;  Surgeon: Lola Donnice HERO, MD;  Location: [facility]S;  Service: Obstetrics;  Laterality: N/A;   WISDOM TOOTH EXTRACTION     Patient Active Problem List   Diagnosis Date Noted   Right foot pain 02/07/2024   Back pain 03/27/2023   Symptomatic mammary hypertrophy 03/27/2023   Chronic neck pain 03/27/2023   Hidradenitis 01/30/2023   Cesarean delivery delivered 09/12/2021    PCP: Knute Thersia Bitters, FNP  REFERRING PROVIDER: Knute Thersia Bitters, FNP  REFERRING DIAG: [id number] (ICD-10-CM) - Chronic neck pain  THERAPY DIAG:  Cervicalgia  Other symptoms and signs involving the musculoskeletal system  Rationale for Evaluation and Treatment: Rehabilitation  ONSET DATE: chronic   SUBJECTIVE:                                                                                                                                                                                                         SUBJECTIVE STATEMENT: Patient states chronic symptoms in neck and L shoulder  area. Also symptoms in foot. No relief from attempted stretches. Heating pad helps.  Takes Alieve sometimes which helps. Symptoms from neck and in L shoulder/shoulder blade and into LUE back/ outer arm and into back of hand. 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 Long history of back issues that has progressively worsened.   PERTINENT HISTORY:  Back pain, Symptomatic mammary hypertrophy  PAIN:  Are you having pain? Yes: NPRS scale: 3-4/10 Pain location: neck and L shoulder Pain description: tingle Aggravating factors: certain sleeping positions Relieving factors: movement, meds, heat  PRECAUTIONS: None  WEIGHT BEARING RESTRICTIONS:  No  FALLS:  Has patient fallen in last 6 months? No    PLOF: Independent  PATIENT GOALS: decrease pain and work on posture, strength   OBJECTIVE: (objective measures from initial evaluation unless otherwise dated)  PATIENT SURVEYS:  NDI:  NECK DISABILITY INDEX  Date: 05/09/24 Score  Pain intensity 1 = The pain is very mild at the moment  2. Personal care (washing, dressing, etc.) 1 =  I can look after myself normally but it causes extra pain  3. Lifting 1 =  I can lift heavy weights but it gives extra pain  4. Reading 2 =  I can read as much as I want with moderate pain in my neck  5. Headaches 2 =  I have moderate headaches, which come infrequently  6. Concentration 2 = I have a fair degree of difficulty in concentrating when I want to  7. Work 1 =  I can only do my usual work, but no more  8. Driving 2 =  I can drive my car as long as I want with moderate pain in my neck  9. Sleeping 3 =  My sleep is moderately disturbed (2-3 hrs sleepless)  10. Recreation 3 = I am able to engage in a few of my usual recreation activities because of pain in   my neck  Total 18/50   Minimum Detectable Change (90% confidence): 5 points or 10% points  COGNITION: Overall cognitive status: Within functional limits for tasks assessed  SENSATION: Impaired L UE C3-C4  POSTURE: rounded shoulders, forward head, and increased thoracic  kyphosis  PALPATION: TTP bilateral suboccipitals, cervical paraspinals, UT, periscap musculature; grossly hypomobile UPA throughout cervical spine but unable to fully assess passive accessories due to symptoms   CERVICAL ROM:   Active ROM A/PROM (deg) eval  Flexion 45  Extension 50  Right lateral flexion 28*  Left lateral flexion 33  Right rotation 82  Left rotation 69   (Blank rows = not tested) *=pain/symptoms  UPPER EXTREMITY ROM: WFL for tasks assessed  Active ROM Right eval Left eval  Shoulder flexion    Shoulder extension    Shoulder abduction    Shoulder adduction    Shoulder extension    Shoulder internal rotation    Shoulder external rotation    Elbow flexion    Elbow extension    Wrist flexion    Wrist extension    Wrist ulnar deviation    Wrist radial deviation    Wrist pronation    Wrist supination     (Blank rows = not tested) *=pain/symptoms  UPPER EXTREMITY MMT:  MMT Right eval Left eval  Shoulder flexion 5 5  Shoulder extension    Shoulder abduction 5 5  Shoulder adduction    Shoulder extension    Shoulder internal rotation    Shoulder external rotation    Middle trapezius    Lower trapezius    Elbow flexion 5 5  Elbow extension 5 5  Wrist flexion 5 4+  Wrist extension 5 5  Wrist ulnar deviation    Wrist radial deviation    Wrist pronation    Wrist supination    Grip strength WFL WFL   (Blank rows = not tested) *=pain/symptoms  CERVICAL SPECIAL TESTS:  Distraction test: Positive    TODAY'S TREATMENT:  DATE:  05/09/24  Eval, education and HEP  PATIENT EDUCATION:  Education details: Patient educated on exam findings, POC, scope of PT, HEP, and posture and relevant anatomy/biomechanics. Person educated: Patient Education method: Explanation, Demonstration, and Handouts Education comprehension: verbalized  understanding, returned demonstration, verbal cues required, and tactile cues required  HOME EXERCISE PROGRAM: Access Code: [email or web address] URL: [email or web address] Date: 05/09/2024 Prepared by: Prentice Kelly Ranieri  Exercises - Supine Chin Tuck  - 3 x daily - 7 x weekly - 2 sets - 10 reps - Seated Cervical Retraction  - 3 x daily - 7 x weekly - 2 sets - 10 reps - Seated Scapular Retraction  - 1 x daily - 7 x weekly - 2 sets - 10 reps - Seated Upper Trapezius Stretch  - 1 x daily - 7 x weekly - 3 reps - 20 second hold  ASSESSMENT:  CLINICAL IMPRESSION: Patient a 29 y.o. y.o. female who was seen today for physical therapy evaluation and treatment for Chronic neck pain. Patient presents with pain limited deficits in cervical spine strength, ROM, endurance, activity tolerance, posture, and functional mobility with ADL. Patient is having to modify and restrict ADL as indicated by outcome measure score as well as subjective information and objective measures which is affecting overall participation. Patient will benefit from skilled physical therapy in order to improve function and reduce impairment.  OBJECTIVE IMPAIRMENTS:  decreased activity tolerance, decreased endurance, decreased mobility, decreased ROM, decreased strength, increased muscle spasms, impaired flexibility, impaired UE functional use, improper body mechanics, and pain  ACTIVITY LIMITATIONS: carrying, lifting, bending, sleeping, bed mobility, reach over head, hygiene/grooming, and caring for others  PARTICIPATION LIMITATIONS:  meal prep, cleaning, laundry, driving, shopping, community activity, occupation, and yard work  PERSONAL FACTORS: 1-2 comorbidities: Back pain, Symptomatic mammary hypertrophy are also affecting patient's functional outcome.   REHAB POTENTIAL: Good  CLINICAL DECISION MAKING: Evolving/moderate complexity  EVALUATION COMPLEXITY: Moderate   GOALS: Goals reviewed with patient? Yes  SHORT TERM  GOALS: Target date: 06/06/2024    Patient will be independent with HEP in order to improve functional outcomes. Baseline: Goal status: INITIAL  2.  Patient will report at least 25% improvement in symptoms for improved quality of life. Baseline:  Goal status: INITIAL    LONG TERM GOALS: Target date: 07/04/2024    Patient will report at least 75% improvement in symptoms for improved quality of life. Baseline:  Goal status: INITIAL  2.  Patient will improve NDI score by at least 9 points in order to indicate improved tolerance to activity. Baseline:  Goal status: INITIAL  3.  Patient will demonstrate at least 25% improvement in cervical ROM in all restricted planes for improved ability to move head with chores. Baseline:  Goal status: INITIAL  4.  Patient will be able to return to all activities unrestricted for improved ability to perform work functions and participate with family.  Baseline:  Goal status: INITIAL  5.  Patient will demonstrate improved postural awareness to improve posture and reduce muscle tension. Baseline:  Goal status: INITIAL       PLAN:  PT FREQUENCY: 1-2x/week  PT DURATION: 8 weeks  PLANNED INTERVENTIONS: 97164- PT Re-evaluation, 97110-Therapeutic exercises, 97530- Therapeutic activity, [id number]- Neuromuscular re-education, 97535- Self Care, 02859- Manual therapy, [id number]- Gait training, [phone]- Orthotic Fit/training, [phone]- Canalith repositioning, [id number]- Aquatic Therapy, 97760- Splinting, [phone]- Wound care (first 20 sq cm), 97598- Wound care (each additional 20 sq cm)Patient/Family education, Balance training, Stair training, Taping, Dry Needling, Joint mobilization, Joint manipulation, Spinal  manipulation, Spinal mobilization, Scar mobilization, and DME instructions.  PLAN FOR NEXT SESSION: postural strengthening, cervical mobility, manual for pain/mobility if needed   Prentice GORMAN Stains, PT, DPT 05/09/2024, 2:34 PM   For all possible CPT codes,  reference the Planned Interventions line above.     Check all conditions that are expected to impact treatment: {Conditions expected to impact treatment:Musculoskeletal disorders   If treatment provided at initial evaluation, no treatment charged due to lack of authorization.      "

## 2024-05-14 ENCOUNTER — Ambulatory Visit (HOSPITAL_BASED_OUTPATIENT_CLINIC_OR_DEPARTMENT_OTHER): Payer: Self-pay | Admitting: Physical Therapy

## 2024-05-14 ENCOUNTER — Encounter (HOSPITAL_BASED_OUTPATIENT_CLINIC_OR_DEPARTMENT_OTHER): Payer: Self-pay | Admitting: Physical Therapy

## 2024-05-14 DIAGNOSIS — R29898 Other symptoms and signs involving the musculoskeletal system: Secondary | ICD-10-CM

## 2024-05-14 DIAGNOSIS — M542 Cervicalgia: Secondary | ICD-10-CM | POA: Diagnosis not present

## 2024-05-14 NOTE — Therapy (Signed)
 " OUTPATIENT PHYSICAL THERAPY TREATMENT   [name] MRN: [id number] DOB:10-29-1995, 29 y.o., female Today's Date: 05/14/2024  END OF SESSION:  PT End of Session - 05/14/24 1346     Visit Number 2    Number of Visits 16    Date for Recertification  07/04/24    Authorization Type [place] MEDICAID HEALTHY BLUE    Authorization Time Period 6 visits approvedFrom 01.09.2026 - 03.09.2026    Authorization - Visit Number 2    Authorization - Number of Visits 6    PT Start Time 1346    PT Stop Time 1427    PT Time Calculation (min) 41 min    Activity Tolerance Patient tolerated treatment well    Behavior During Therapy [facility] for tasks assessed/performed          Past Medical History:  Diagnosis Date   Allergy Not sure   Pineapple & penicillin   Anxiety 01/31/2024   Depression Whenni was a child   Substance abuse (HCC)    Past Surgical History:  Procedure Laterality Date   CESAREAN SECTION N/A 09/12/2021   Procedure: CESAREAN SECTION;  Surgeon: Lola Donnice HERO, MD;  Location: [facility] LD ORS;  Service: Obstetrics;  Laterality: N/A;   WISDOM TOOTH EXTRACTION     Patient Active Problem List   Diagnosis Date Noted   Right foot pain 02/07/2024   Back pain 03/27/2023   Symptomatic mammary hypertrophy 03/27/2023   Chronic neck pain 03/27/2023   Hidradenitis 01/30/2023   Cesarean delivery delivered 09/12/2021    PCP: Knute Thersia Bitters, FNP  REFERRING PROVIDER: Knute Thersia Bitters, FNP  REFERRING DIAG: [phone] (ICD-10-CM) - Chronic neck pain  THERAPY DIAG:  Cervicalgia  Other symptoms and signs involving the musculoskeletal system  Rationale for Evaluation and Treatment: Rehabilitation  ONSET DATE: chronic   SUBJECTIVE:                                                                                                                                                                                                         SUBJECTIVE  STATEMENT: Patient states HEP helps. The laying down retractions are helpful to get a good stretch.    EVAL: Patient states chronic symptoms in neck and L shoulder  area. Also symptoms in foot. No relief from attempted stretches. Heating pad helps. Takes Alieve sometimes which helps. Symptoms from neck and in L shoulder/shoulder blade and into LUE back/ outer arm and into back of hand. 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 Long history of back issues that has progressively worsened.  PERTINENT HISTORY:  Back pain, Symptomatic mammary hypertrophy  PAIN:  Are you having pain? Yes: NPRS scale: 5-6/10 Pain location: neck and L shoulder Pain description: tingle Aggravating factors: certain sleeping positions Relieving factors: movement, meds, heat  PRECAUTIONS: None  WEIGHT BEARING RESTRICTIONS: No  FALLS:  Has patient fallen in last 6 months? No    PLOF: Independent  PATIENT GOALS: decrease pain and work on posture, strength   OBJECTIVE: (objective measures from initial evaluation unless otherwise dated)  PATIENT SURVEYS:  NDI:  NECK DISABILITY INDEX  Date: 05/09/24 Score  Pain intensity 1 = The pain is very mild at the moment  2. Personal care (washing, dressing, etc.) 1 =  I can look after myself normally but it causes extra pain  3. Lifting 1 =  I can lift heavy weights but it gives extra pain  4. Reading 2 =  I can read as much as I want with moderate pain in my neck  5. Headaches 2 =  I have moderate headaches, which come infrequently  6. Concentration 2 = I have a fair degree of difficulty in concentrating when I want to  7. Work 1 =  I can only do my usual work, but no more  8. Driving 2 =  I can drive my car as long as I want with moderate pain in my neck  9. Sleeping 3 =  My sleep is moderately disturbed (2-3 hrs sleepless)  10. Recreation 3 = I am able to engage in a few of my usual recreation activities because of pain in   my neck  Total 18/50   Minimum Detectable Change  (90% confidence): 5 points or 10% points  COGNITION: Overall cognitive status: Within functional limits for tasks assessed  SENSATION: Impaired L UE C3-C4  POSTURE: rounded shoulders, forward head, and increased thoracic kyphosis  PALPATION: TTP bilateral suboccipitals, cervical paraspinals, UT, periscap musculature; grossly hypomobile UPA throughout cervical spine but unable to fully assess passive accessories due to symptoms   CERVICAL ROM:   Active ROM A/PROM (deg) eval  Flexion 45  Extension 50  Right lateral flexion 28*  Left lateral flexion 33  Right rotation 82  Left rotation 69   (Blank rows = not tested) *=pain/symptoms  UPPER EXTREMITY ROM: WFL for tasks assessed  Active ROM Right eval Left eval  Shoulder flexion    Shoulder extension    Shoulder abduction    Shoulder adduction    Shoulder extension    Shoulder internal rotation    Shoulder external rotation    Elbow flexion    Elbow extension    Wrist flexion    Wrist extension    Wrist ulnar deviation    Wrist radial deviation    Wrist pronation    Wrist supination     (Blank rows = not tested) *=pain/symptoms  UPPER EXTREMITY MMT:  MMT Right eval Left eval  Shoulder flexion 5 5  Shoulder extension    Shoulder abduction 5 5  Shoulder adduction    Shoulder extension    Shoulder internal rotation    Shoulder external rotation    Middle trapezius    Lower trapezius    Elbow flexion 5 5  Elbow extension 5 5  Wrist flexion 5 4+  Wrist extension 5 5  Wrist ulnar deviation    Wrist radial deviation    Wrist pronation    Wrist supination    Grip strength WFL WFL   (Blank rows = not tested) *=  pain/symptoms  CERVICAL SPECIAL TESTS:  Distraction test: Positive    TODAY'S TREATMENT:                                                                                                                              DATE:  05/14/24 Seated cervical retractions 2 x 10 Manual: STM to L UT and periscap  musculature; self STM with tennis ball Seated UT stretch 2 x 20 second holds Supine cervical retractions 2 x 10 Supine scap retractions 2 x 10 Standing row GTB 2 x 10 Standing shoulder extension GTB 2 x 10  05/09/24  Eval, education and HEP  PATIENT EDUCATION:  Education details: Patient educated on exam findings, POC, scope of PT, HEP, and posture and relevant anatomy/biomechanics. 05/14/24: HEP Person educated: Patient Education method: Programmer, Multimedia, Demonstration, and Handouts Education comprehension: verbalized understanding, returned demonstration, verbal cues required, and tactile cues required  HOME EXERCISE PROGRAM: Access Code: [email or web address] URL: [email or web address] Date: 05/09/2024 Prepared by: Prentice Brixton Franko  Exercises - Supine Chin Tuck  - 3 x daily - 7 x weekly - 2 sets - 10 reps - Seated Cervical Retraction  - 3 x daily - 7 x weekly - 2 sets - 10 reps - Seated Scapular Retraction  - 1 x daily - 7 x weekly - 2 sets - 10 reps - Seated Upper Trapezius Stretch  - 1 x daily - 7 x weekly - 3 reps - 20 second hold  ASSESSMENT:  CLINICAL IMPRESSION: Began session with manual to hyperactive and tender musculature with decrease in tissue tension and improvement in sensitivity. Review of HEP with good mechanics with minimal to no cueing. Began resisted postural strengthening which is performed well with cueing. Patient will continue to benefit from physical therapy in order to improve function and reduce impairment.   OBJECTIVE IMPAIRMENTS:  decreased activity tolerance, decreased endurance, decreased mobility, decreased ROM, decreased strength, increased muscle spasms, impaired flexibility, impaired UE functional use, improper body mechanics, and pain  ACTIVITY LIMITATIONS: carrying, lifting, bending, sleeping, bed mobility, reach over head, hygiene/grooming, and caring for others  PARTICIPATION LIMITATIONS:  meal prep, cleaning, laundry, driving, shopping, community  activity, occupation, and yard work  PERSONAL FACTORS: 1-2 comorbidities: Back pain, Symptomatic mammary hypertrophy are also affecting patient's functional outcome.   REHAB POTENTIAL: Good  CLINICAL DECISION MAKING: Evolving/moderate complexity  EVALUATION COMPLEXITY: Moderate   GOALS: Goals reviewed with patient? Yes  SHORT TERM GOALS: Target date: 06/06/2024    Patient will be independent with HEP in order to improve functional outcomes. Baseline: Goal status: INITIAL  2.  Patient will report at least 25% improvement in symptoms for improved quality of life. Baseline:  Goal status: INITIAL    LONG TERM GOALS: Target date: 07/04/2024    Patient will report at least 75% improvement in symptoms for improved quality of life. Baseline:  Goal status: INITIAL  2.  Patient will improve NDI score by at least 9 points in order to  indicate improved tolerance to activity. Baseline:  Goal status: INITIAL  3.  Patient will demonstrate at least 25% improvement in cervical ROM in all restricted planes for improved ability to move head with chores. Baseline:  Goal status: INITIAL  4.  Patient will be able to return to all activities unrestricted for improved ability to perform work functions and participate with family.  Baseline:  Goal status: INITIAL  5.  Patient will demonstrate improved postural awareness to improve posture and reduce muscle tension. Baseline:  Goal status: INITIAL       PLAN:  PT FREQUENCY: 1-2x/week  PT DURATION: 8 weeks  PLANNED INTERVENTIONS: 97164- PT Re-evaluation, 97110-Therapeutic exercises, 97530- Therapeutic activity, [id number]- Neuromuscular re-education, 97535- Self Care, [id number]- Manual therapy, [id number]- Gait training, [phone]- Orthotic Fit/training, [phone]- Canalith repositioning, [id number]- Aquatic Therapy, [phone]- Splinting, [phone]- Wound care (first 20 sq cm), 97598- Wound care (each additional 20 sq cm)Patient/Family education, Balance training, Stair  training, Taping, Dry Needling, Joint mobilization, Joint manipulation, Spinal manipulation, Spinal mobilization, Scar mobilization, and DME instructions.  PLAN FOR NEXT SESSION: postural strengthening, cervical mobility, manual for pain/mobility if needed   Prentice GORMAN Stains, PT, DPT 05/14/2024, 2:29 PM   For all possible CPT codes, reference the Planned Interventions line above.     Check all conditions that are expected to impact treatment: {Conditions expected to impact treatment:Musculoskeletal disorders   If treatment provided at initial evaluation, no treatment charged due to lack of authorization.      "

## 2024-05-19 ENCOUNTER — Ambulatory Visit (HOSPITAL_BASED_OUTPATIENT_CLINIC_OR_DEPARTMENT_OTHER): Payer: Self-pay | Admitting: Family Medicine

## 2024-05-19 NOTE — Progress Notes (Signed)
 Bilateral breast ultrasounds are negative for acute findings. Patient was made aware at time of imaging of results.

## 2024-05-21 ENCOUNTER — Ambulatory Visit (HOSPITAL_BASED_OUTPATIENT_CLINIC_OR_DEPARTMENT_OTHER): Payer: Self-pay | Admitting: Physical Therapy

## 2024-05-21 ENCOUNTER — Encounter (HOSPITAL_BASED_OUTPATIENT_CLINIC_OR_DEPARTMENT_OTHER): Payer: Self-pay | Admitting: Physical Therapy

## 2024-05-21 DIAGNOSIS — M542 Cervicalgia: Secondary | ICD-10-CM | POA: Diagnosis not present

## 2024-05-21 DIAGNOSIS — R29898 Other symptoms and signs involving the musculoskeletal system: Secondary | ICD-10-CM

## 2024-05-21 NOTE — Therapy (Signed)
 " OUTPATIENT PHYSICAL THERAPY TREATMENT   [name] MRN: [id number] DOB:Feb 27, 1996, 29 y.o., female Today's Date: 05/21/2024  END OF SESSION:  PT End of Session - 05/21/24 1353     Visit Number 3    Number of Visits 16    Date for Recertification  07/04/24    Authorization Type [place] MEDICAID HEALTHY BLUE    Authorization Time Period 6 visits approvedFrom 01.09.2026 - 03.09.2026    Authorization - Visit Number 3    Authorization - Number of Visits 6    PT Start Time 1358   arrives late/delayed check in time/ bathroom   PT Stop Time 1427    PT Time Calculation (min) 29 min    Activity Tolerance Patient tolerated treatment well    Behavior During Therapy [facility] for tasks assessed/performed          Past Medical History:  Diagnosis Date   Allergy Not sure   Pineapple & penicillin   Anxiety 01/31/2024   Depression Whenni was a child   Substance abuse (HCC)    Past Surgical History:  Procedure Laterality Date   CESAREAN SECTION N/A 09/12/2021   Procedure: CESAREAN SECTION;  Surgeon: Lola Donnice HERO, MD;  Location: [facility] LD ORS;  Service: Obstetrics;  Laterality: N/A;   WISDOM TOOTH EXTRACTION     Patient Active Problem List   Diagnosis Date Noted   Right foot pain 02/07/2024   Back pain 03/27/2023   Symptomatic mammary hypertrophy 03/27/2023   Chronic neck pain 03/27/2023   Hidradenitis 01/30/2023   Cesarean delivery delivered 09/12/2021    PCP: Knute Thersia Bitters, FNP  REFERRING PROVIDER: Knute Thersia Bitters, FNP  REFERRING DIAG: [phone] (ICD-10-CM) - Chronic neck pain  THERAPY DIAG:  Cervicalgia  Other symptoms and signs involving the musculoskeletal system  Rationale for Evaluation and Treatment: Rehabilitation  ONSET DATE: chronic   SUBJECTIVE:                                                                                                                                                                                                          SUBJECTIVE STATEMENT: Patient states back is doing better with certain movements. Doing HEP. Tennis ball for STM frequently.    EVAL: Patient states chronic symptoms in neck and L shoulder  area. Also symptoms in foot. No relief from attempted stretches. Heating pad helps. Takes Alieve sometimes which helps. Symptoms from neck and in L shoulder/shoulder blade and into LUE back/ outer arm and into back of hand. 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   PERTINENT HISTORY:  Back pain, Symptomatic mammary hypertrophy  PAIN:  Are you having pain? Yes: NPRS scale: 5-6/10 Pain location: neck and L shoulder Pain description: tingle Aggravating factors: certain sleeping positions Relieving factors: movement, meds, heat  PRECAUTIONS: None  WEIGHT BEARING RESTRICTIONS: No  FALLS:  Has patient fallen in last 6 months? No    PLOF: Independent  PATIENT GOALS: decrease pain and work on posture, strength   OBJECTIVE: (objective measures from initial evaluation unless otherwise dated)  PATIENT SURVEYS:  NDI:  NECK DISABILITY INDEX  Date: 05/09/24 Score  Pain intensity 1 = The pain is very mild at the moment  2. Personal care (washing, dressing, etc.) 1 =  I can look after myself normally but it causes extra pain  3. Lifting 1 =  I can lift heavy weights but it gives extra pain  4. Reading 2 =  I can read as much as I want with moderate pain in my neck  5. Headaches 2 =  I have moderate headaches, which come infrequently  6. Concentration 2 = I have a fair degree of difficulty in concentrating when I want to  7. Work 1 =  I can only do my usual work, but no more  8. Driving 2 =  I can drive my car as long as I want with moderate pain in my neck  9. Sleeping 3 =  My sleep is moderately disturbed (2-3 hrs sleepless)  10. Recreation 3 = I am able to engage in a few of my usual recreation activities because of pain in   my neck   Total 18/50   Minimum Detectable Change (90% confidence): 5 points or 10% points  COGNITION: Overall cognitive status: Within functional limits for tasks assessed  SENSATION: Impaired L UE C3-C4  POSTURE: rounded shoulders, forward head, and increased thoracic kyphosis  PALPATION: TTP bilateral suboccipitals, cervical paraspinals, UT, periscap musculature; grossly hypomobile UPA throughout cervical spine but unable to fully assess passive accessories due to symptoms   CERVICAL ROM:   Active ROM A/PROM (deg) eval  Flexion 45  Extension 50  Right lateral flexion 28*  Left lateral flexion 33  Right rotation 82  Left rotation 69   (Blank rows = not tested) *=pain/symptoms  UPPER EXTREMITY ROM: WFL for tasks assessed  Active ROM Right eval Left eval  Shoulder flexion    Shoulder extension    Shoulder abduction    Shoulder adduction    Shoulder extension    Shoulder internal rotation    Shoulder external rotation    Elbow flexion    Elbow extension    Wrist flexion    Wrist extension    Wrist ulnar deviation    Wrist radial deviation    Wrist pronation    Wrist supination     (Blank rows = not tested) *=pain/symptoms  UPPER EXTREMITY MMT:  MMT Right eval Left eval  Shoulder flexion 5 5  Shoulder extension    Shoulder abduction 5 5  Shoulder adduction    Shoulder extension    Shoulder internal rotation    Shoulder external rotation    Middle trapezius    Lower trapezius    Elbow flexion 5 5  Elbow extension 5 5  Wrist flexion 5 4+  Wrist extension 5 5  Wrist ulnar deviation    Wrist radial deviation    Wrist pronation    Wrist supination    Grip  strength WFL WFL   (Blank rows = not tested) *=pain/symptoms  CERVICAL SPECIAL TESTS:  Distraction test: Positive    TODAY'S TREATMENT:                                                                                                                              DATE:  05/21/24 Discussion of sleeping  positions Standing row GTB 3 x 10 Standing shoulder extension GTB 3 x 10 Thoracic extension over chair 10 x 5 second holds Thoracic rotation in chair 1 x 10 Standing bilateral ER GTB 2 x 10 Standing bilateral horizontal abduction GTB 2 x 10   05/14/24 Seated cervical retractions 2 x 10 Manual: STM to L UT and periscap musculature; self STM with tennis ball Seated UT stretch 2 x 20 second holds Supine cervical retractions 2 x 10 Supine scap retractions 2 x 10 Standing row GTB 2 x 10 Standing shoulder extension GTB 2 x 10  05/09/24  Eval, education and HEP  PATIENT EDUCATION:  Education details: Patient educated on exam findings, POC, scope of PT, HEP, and posture and relevant anatomy/biomechanics. 05/14/24: HEP Person educated: Patient Education method: Programmer, Multimedia, Demonstration, and Handouts Education comprehension: verbalized understanding, returned demonstration, verbal cues required, and tactile cues required  HOME EXERCISE PROGRAM: Access Code: [email or web address] URL: [email or web address] Date: 05/09/2024 Prepared by: Prentice Elan Brainerd  Exercises - Supine Chin Tuck  - 3 x daily - 7 x weekly - 2 sets - 10 reps - Seated Cervical Retraction  - 3 x daily - 7 x weekly - 2 sets - 10 reps - Seated Scapular Retraction  - 1 x daily - 7 x weekly - 2 sets - 10 reps - Seated Upper Trapezius Stretch  - 1 x daily - 7 x weekly - 3 reps - 20 second hold  ASSESSMENT:  CLINICAL IMPRESSION: Session limited secondary to patient's late arrival, etc. Discusses sleeping positions and modifications to get comfortable. Continued resisted postural strengthening which is performed well with cueing. Patient will continue to benefit from physical therapy in order to improve function and reduce impairment.   OBJECTIVE IMPAIRMENTS:  decreased activity tolerance, decreased endurance, decreased mobility, decreased ROM, decreased strength, increased muscle spasms, impaired flexibility, impaired UE  functional use, improper body mechanics, and pain  ACTIVITY LIMITATIONS: carrying, lifting, bending, sleeping, bed mobility, reach over head, hygiene/grooming, and caring for others  PARTICIPATION LIMITATIONS:  meal prep, cleaning, laundry, driving, shopping, community activity, occupation, and yard work  PERSONAL FACTORS: 1-2 comorbidities: Back pain, Symptomatic mammary hypertrophy are also affecting patient's functional outcome.   REHAB POTENTIAL: Good  CLINICAL DECISION MAKING: Evolving/moderate complexity  EVALUATION COMPLEXITY: Moderate   GOALS: Goals reviewed with patient? Yes  SHORT TERM GOALS: Target date: 06/06/2024    Patient will be independent with HEP in order to improve functional outcomes. Baseline: Goal status: INITIAL  2.  Patient will report at least 25% improvement in symptoms for improved quality of life. Baseline:  Goal status:  INITIAL    LONG TERM GOALS: Target date: 07/04/2024    Patient will report at least 75% improvement in symptoms for improved quality of life. Baseline:  Goal status: INITIAL  2.  Patient will improve NDI score by at least 9 points in order to indicate improved tolerance to activity. Baseline:  Goal status: INITIAL  3.  Patient will demonstrate at least 25% improvement in cervical ROM in all restricted planes for improved ability to move head with chores. Baseline:  Goal status: INITIAL  4.  Patient will be able to return to all activities unrestricted for improved ability to perform work functions and participate with family.  Baseline:  Goal status: INITIAL  5.  Patient will demonstrate improved postural awareness to improve posture and reduce muscle tension. Baseline:  Goal status: INITIAL       PLAN:  PT FREQUENCY: 1-2x/week  PT DURATION: 8 weeks  PLANNED INTERVENTIONS: 97164- PT Re-evaluation, 97110-Therapeutic exercises, 97530- Therapeutic activity, [id number]- Neuromuscular re-education, 97535- Self Care,  97140- Manual therapy, [id number]- Gait training, 02239- Orthotic Fit/training, 04007- Canalith repositioning, 02886- Aquatic Therapy, [phone]- Splinting, 02402- Wound care (first 20 sq cm), 97598- Wound care (each additional 20 sq cm)Patient/Family education, Balance training, Stair training, Taping, Dry Needling, Joint mobilization, Joint manipulation, Spinal manipulation, Spinal mobilization, Scar mobilization, and DME instructions.  PLAN FOR NEXT SESSION: postural strengthening, cervical mobility, manual for pain/mobility if needed   Prentice GORMAN Stains, PT, DPT 05/21/2024, 2:26 PM   For all possible CPT codes, reference the Planned Interventions line above.     Check all conditions that are expected to impact treatment: {Conditions expected to impact treatment:Musculoskeletal disorders   If treatment provided at initial evaluation, no treatment charged due to lack of authorization.      "

## 2024-05-26 ENCOUNTER — Ambulatory Visit (HOSPITAL_BASED_OUTPATIENT_CLINIC_OR_DEPARTMENT_OTHER): Payer: Self-pay

## 2024-05-27 ENCOUNTER — Encounter (HOSPITAL_BASED_OUTPATIENT_CLINIC_OR_DEPARTMENT_OTHER): Admitting: Family Medicine

## 2024-05-28 ENCOUNTER — Ambulatory Visit (HOSPITAL_BASED_OUTPATIENT_CLINIC_OR_DEPARTMENT_OTHER)

## 2024-05-28 ENCOUNTER — Encounter (HOSPITAL_BASED_OUTPATIENT_CLINIC_OR_DEPARTMENT_OTHER): Payer: Self-pay

## 2024-05-28 DIAGNOSIS — R29898 Other symptoms and signs involving the musculoskeletal system: Secondary | ICD-10-CM

## 2024-05-28 DIAGNOSIS — M542 Cervicalgia: Secondary | ICD-10-CM

## 2024-05-28 NOTE — Therapy (Signed)
 " OUTPATIENT PHYSICAL THERAPY TREATMENT   [name] MRN: [id number] DOB:1996/04/29, 29 y.o., female Today's Date: 05/28/2024  END OF SESSION:  PT End of Session - 05/28/24 1245     Visit Number 4    Number of Visits 16    Date for Recertification  07/04/24    Authorization Type [place] MEDICAID HE[employer]Y BLUE    Authorization Time Period 6 visits approvedFrom 01.09.2026 - 03.09.2026    Authorization - Visit Number 4    Authorization - Number of Visits 6    PT Start Time 1150    PT Stop Time 1229    PT Time Calculation (min) 39 min    Activity Tolerance Patient tolerated treatment well    Behavior During Therapy [facility] for tasks assessed/performed           Past Medical History:  Diagnosis Date   Allergy Not sure   Pineapple & penicillin   Anxiety 01/31/2024   Depression Whenni was a child   Substance abuse (HCC)    Past Surgical History:  Procedure Laterality Date   CESAREAN SECTION N/A 09/12/2021   Procedure: CESAREAN SECTION;  Surgeon: Lola Donnice HERO, MD;  Location: [facility] LD ORS;  Service: Obstetrics;  Laterality: N/A;   WISDOM TOOTH EXTRACTION     Patient Active Problem List   Diagnosis Date Noted   Right foot pain 02/07/2024   Back pain 03/27/2023   Symptomatic mammary hypertrophy 03/27/2023   Chronic neck pain 03/27/2023   Hidradenitis 01/30/2023   Cesarean delivery delivered 09/12/2021    PCP: Knute Thersia Bitters, FNP  REFERRING PROVIDER: Knute Thersia Bitters, FNP  REFERRING DIAG: [phone] (ICD-10-CM) - Chronic neck pain  THERAPY DIAG:  Cervicalgia  Other symptoms and signs involving the musculoskeletal system  Rationale for Evaluation and Treatment: Rehabilitation  ONSET DATE: chronic   SUBJECTIVE:                                                                                                                                                                                                         SUBJECTIVE  STATEMENT: Pt reports neck pain has improved, but still feeling tightness/pain into mid back and L shoulder blade area.    EVAL: Patient states chronic symptoms in neck and L shoulder  area. Also symptoms in foot. No relief from attempted stretches. Heating pad helps. Takes Alieve sometimes which helps. Symptoms from neck and in L shoulder/shoulder blade and into LUE back/ outer arm and into back of hand. 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 Long history of back issues  that has progressively worsened.   PERTINENT HISTORY:  Back pain, Symptomatic mammary hypertrophy  PAIN:  Are you having pain? Yes: NPRS scale: 5-6/10 Pain location: neck and L shoulder Pain description: tingle Aggravating factors: certain sleeping positions Relieving factors: movement, meds, heat  PRECAUTIONS: None  WEIGHT BEARING RESTRICTIONS: No  FALLS:  Has patient fallen in last 6 months? No    PLOF: Independent  PATIENT GOALS: decrease pain and work on posture, strength   OBJECTIVE: (objective measures from initial evaluation unless otherwise dated)  PATIENT SURVEYS:  NDI:  NECK DISABILITY INDEX  Date: 05/09/24 Score  Pain intensity 1 = The pain is very mild at the moment  2. Personal care (washing, dressing, etc.) 1 =  I can look after myself normally but it causes extra pain  3. Lifting 1 =  I can lift heavy weights but it gives extra pain  4. Reading 2 =  I can read as much as I want with moderate pain in my neck  5. Headaches 2 =  I have moderate headaches, which come infrequently  6. Concentration 2 = I have a fair degree of difficulty in concentrating when I want to  7. Work 1 =  I can only do my usual work, but no more  8. Driving 2 =  I can drive my car as long as I want with moderate pain in my neck  9. Sleeping 3 =  My sleep is moderately disturbed (2-3 hrs sleepless)  10. Recreation 3 = I am able to engage in a few of my usual recreation activities because of pain in   my neck  Total 18/50   Minimum  Detectable Change (90% confidence): 5 points or 10% points  COGNITION: Overall cognitive status: Within functional limits for tasks assessed  SENSATION: Impaired L UE C3-C4  POSTURE: rounded shoulders, forward head, and increased thoracic kyphosis  PALPATION: TTP bilateral suboccipitals, cervical paraspinals, UT, periscap musculature; grossly hypomobile UPA throughout cervical spine but unable to fully assess passive accessories due to symptoms   CERVICAL ROM:   Active ROM A/PROM (deg) eval  Flexion 45  Extension 50  Right lateral flexion 28*  Left lateral flexion 33  Right rotation 82  Left rotation 69   (Blank rows = not tested) *=pain/symptoms  UPPER EXTREMITY ROM: WFL for tasks assessed  Active ROM Right eval Left eval  Shoulder flexion    Shoulder extension    Shoulder abduction    Shoulder adduction    Shoulder extension    Shoulder internal rotation    Shoulder external rotation    Elbow flexion    Elbow extension    Wrist flexion    Wrist extension    Wrist ulnar deviation    Wrist radial deviation    Wrist pronation    Wrist supination     (Blank rows = not tested) *=pain/symptoms  UPPER EXTREMITY MMT:  MMT Right eval Left eval  Shoulder flexion 5 5  Shoulder extension    Shoulder abduction 5 5  Shoulder adduction    Shoulder extension    Shoulder internal rotation    Shoulder external rotation    Middle trapezius    Lower trapezius    Elbow flexion 5 5  Elbow extension 5 5  Wrist flexion 5 4+  Wrist extension 5 5  Wrist ulnar deviation    Wrist radial deviation    Wrist pronation    Wrist supination    Grip strength WFL WFL   (  Blank rows = not tested) *=pain/symptoms  CERVICAL SPECIAL TESTS:  Distraction test: Positive    TODAY'S TREATMENT:                                                                                                                              DATE:   05/28/24: STM to L thoracic ps and periscapular mm in  seated position Doorway pec stretch  Doorway lat stretch Doorway rhomboid stretch Quadruped thread needle stretching 5 x5ea Quadruped thoracic rotation 3 x8ea  Child's pose stretch 2x15 sec QL stretching seated bil Bil ER isometric with palms neutral band looped around hands Theraband row Blue TB 2x15 Theraband extension Green TB 2x15 Discussion of posture and body mechanics    05/21/24 Discussion of sleeping positions Standing row GTB 3 x 10 Standing shoulder extension GTB 3 x 10 Thoracic extension over chair 10 x 5 second holds Thoracic rotation in chair 1 x 10 Standing bilateral ER GTB 2 x 10 Standing bilateral horizontal abduction GTB 2 x 10   05/14/24 Seated cervical retractions 2 x 10 Manual: STM to L UT and periscap musculature; self STM with tennis ball Seated UT stretch 2 x 20 second holds Supine cervical retractions 2 x 10 Supine scap retractions 2 x 10 Standing row GTB 2 x 10 Standing shoulder extension GTB 2 x 10  05/09/24  Eval, education and HEP  PATIENT EDUCATION:  Education details: Patient educated on exam findings, POC, scope of PT, HEP, and posture and relevant anatomy/biomechanics. 05/14/24: HEP Person educated: Patient Education method: Programmer, Multimedia, Demonstration, and Handouts Education comprehension: verbalized understanding, returned demonstration, verbal cues required, and tactile cues required  HOME EXERCISE PROGRAM: Access Code: [email or web address] URL: [email or web address] Date: 05/09/2024 Prepared by: Prentice Zaunegger  Exercises - Supine Chin Tuck  - 3 x daily - 7 x weekly - 2 sets - 10 reps - Seated Cervical Retraction  - 3 x daily - 7 x weekly - 2 sets - 10 reps - Seated Scapular Retraction  - 1 x daily - 7 x weekly - 2 sets - 10 reps - Seated Upper Trapezius Stretch  - 1 x daily - 7 x weekly - 3 reps - 20 second hold  ASSESSMENT:  CLINICAL IMPRESSION: Patient significantly tight and tender to palpation of left-sided thoracic  paraspinals as well as periscapular musculature.  Worked on manual techniques to reduce this.  Patient instructed in self IASTM to perform at home as well.  Continue to work on postural rehab and [facility] focused interventions.  Patient responded well to stretching targeting lats/teres/pec muscles.  She also felt relief following rhomboid stretching.  Discussed lifting and reaching mechanics to reduce pain with caregiving duties.  Will continue to monitor pain level and progress as tolerated.  OBJECTIVE IMPAIRMENTS:  decreased activity tolerance, decreased endurance, decreased mobility, decreased ROM, decreased strength, increased muscle spasms, impaired flexibility, impaired UE functional use, improper body mechanics, and pain  ACTIVITY LIMITATIONS: carrying, lifting, bending, sleeping,  bed mobility, reach over head, hygiene/grooming, and caring for others  PARTICIPATION LIMITATIONS:  meal prep, cleaning, laundry, driving, shopping, community activity, occupation, and yard work  PERSONAL FACTORS: 1-2 comorbidities: Back pain, Symptomatic mammary hypertrophy are also affecting patient's functional outcome.   REHAB POTENTIAL: Good  CLINICAL DECISION MAKING: Evolving/moderate complexity  EVALUATION COMPLEXITY: Moderate   GOALS: Goals reviewed with patient? Yes  SHORT TERM GOALS: Target date: 06/06/2024    Patient will be independent with HEP in order to improve functional outcomes. Baseline: Goal status: INITIAL  2.  Patient will report at least 25% improvement in symptoms for improved quality of life. Baseline:  Goal status: INITIAL    LONG TERM GOALS: Target date: 07/04/2024    Patient will report at least 75% improvement in symptoms for improved quality of life. Baseline:  Goal status: INITIAL  2.  Patient will improve NDI score by at least 9 points in order to indicate improved tolerance to activity. Baseline:  Goal status: INITIAL  3.  Patient will demonstrate at least 25%  improvement in cervical ROM in all restricted planes for improved ability to move head with chores. Baseline:  Goal status: INITIAL  4.  Patient will be able to return to all activities unrestricted for improved ability to perform work functions and participate with family.  Baseline:  Goal status: INITIAL  5.  Patient will demonstrate improved postural awareness to improve posture and reduce muscle tension. Baseline:  Goal status: INITIAL       PLAN:  PT FREQUENCY: 1-2x/week  PT DURATION: 8 weeks  PLANNED INTERVENTIONS: 97164- PT Re-evaluation, 97110-Therapeutic exercises, 97530- Therapeutic activity, [id number]- Neuromuscular re-education, 97535- Self Care, [id number]- Manual therapy, [id number]- Gait training, [phone]- Orthotic Fit/training, [phone]- Canalith repositioning, [id number]- Aquatic Therapy, [phone]- Splinting, [phone]- Wound care (first 20 sq cm), 97598- Wound care (each additional 20 sq cm)Patient/Family education, Balance training, Stair training, Taping, Dry Needling, Joint mobilization, Joint manipulation, Spinal manipulation, Spinal mobilization, Scar mobilization, and DME instructions.  PLAN FOR NEXT SESSION: postural strengthening, cervical mobility, manual for pain/mobility if needed   Asberry FORBES Rodes, PTA, 05/28/2024, 12:46 PM   For all possible CPT codes, reference the Planned Interventions line above.     Check all conditions that are expected to impact treatment: {Conditions expected to impact treatment:Musculoskeletal disorders   If treatment provided at initial evaluation, no treatment charged due to lack of authorization.      "

## 2024-06-03 ENCOUNTER — Ambulatory Visit (HOSPITAL_BASED_OUTPATIENT_CLINIC_OR_DEPARTMENT_OTHER): Admitting: Physical Therapy

## 2024-06-03 ENCOUNTER — Telehealth (HOSPITAL_BASED_OUTPATIENT_CLINIC_OR_DEPARTMENT_OTHER): Payer: Self-pay | Admitting: Physical Therapy

## 2024-06-03 NOTE — Telephone Encounter (Signed)
 No-show, called and spoke to pt- she plans to be at next scheduled visit on 06/10/24.  Josette Rough, PT, DPT 06/03/24 2:05 PM

## 2024-06-10 ENCOUNTER — Ambulatory Visit (HOSPITAL_BASED_OUTPATIENT_CLINIC_OR_DEPARTMENT_OTHER): Admitting: Physical Therapy

## 2024-06-17 ENCOUNTER — Encounter (HOSPITAL_BASED_OUTPATIENT_CLINIC_OR_DEPARTMENT_OTHER): Admitting: Physical Therapy

## 2024-06-20 ENCOUNTER — Encounter (HOSPITAL_BASED_OUTPATIENT_CLINIC_OR_DEPARTMENT_OTHER): Payer: Self-pay | Admitting: Physical Therapy

## 2024-06-24 ENCOUNTER — Encounter (HOSPITAL_BASED_OUTPATIENT_CLINIC_OR_DEPARTMENT_OTHER): Admitting: Physical Therapy

## 2024-07-01 ENCOUNTER — Encounter (HOSPITAL_BASED_OUTPATIENT_CLINIC_OR_DEPARTMENT_OTHER): Admitting: Physical Therapy

## 2024-07-08 ENCOUNTER — Encounter (HOSPITAL_BASED_OUTPATIENT_CLINIC_OR_DEPARTMENT_OTHER): Admitting: Physical Therapy
